# Patient Record
Sex: Female | Born: 1975 | Race: White | Hispanic: No | Smoking: Never smoker
Health system: Southern US, Community
[De-identification: ages and names within clinical notes are randomized; demographics above are authoritative.]

## PROBLEM LIST (undated history)

## (undated) DIAGNOSIS — R569 Unspecified convulsions: Secondary | ICD-10-CM

## (undated) DIAGNOSIS — G43909 Migraine, unspecified, not intractable, without status migrainosus: Secondary | ICD-10-CM

## (undated) DIAGNOSIS — O009 Unspecified ectopic pregnancy without intrauterine pregnancy: Secondary | ICD-10-CM

## (undated) HISTORY — PX: OTHER SURGICAL HISTORY: SHX169

## (undated) HISTORY — PX: RHINOPLASTY: SUR1284

## (undated) HISTORY — PX: KIDNEY STONE SURGERY: SHX686

## (undated) HISTORY — PX: HERNIA REPAIR: SHX51

---

## 1998-01-10 ENCOUNTER — Other Ambulatory Visit: Admission: RE | Admit: 1998-01-10 | Discharge: 1998-01-10 | Payer: Self-pay | Admitting: Obstetrics

## 1998-01-25 ENCOUNTER — Ambulatory Visit (HOSPITAL_COMMUNITY): Admission: RE | Admit: 1998-01-25 | Discharge: 1998-01-25 | Payer: Self-pay | Admitting: Obstetrics

## 1998-03-31 ENCOUNTER — Inpatient Hospital Stay (HOSPITAL_COMMUNITY): Admission: AD | Admit: 1998-03-31 | Discharge: 1998-04-02 | Payer: Self-pay | Admitting: Obstetrics

## 1998-04-09 ENCOUNTER — Inpatient Hospital Stay (HOSPITAL_COMMUNITY): Admission: AD | Admit: 1998-04-09 | Discharge: 1998-04-09 | Payer: Self-pay | Admitting: Obstetrics

## 1998-04-09 ENCOUNTER — Ambulatory Visit (HOSPITAL_COMMUNITY): Admission: RE | Admit: 1998-04-09 | Discharge: 1998-04-09 | Payer: Self-pay | Admitting: Obstetrics

## 1998-05-07 ENCOUNTER — Inpatient Hospital Stay (HOSPITAL_COMMUNITY): Admission: AD | Admit: 1998-05-07 | Discharge: 1998-05-07 | Payer: Self-pay | Admitting: Obstetrics

## 1998-05-31 ENCOUNTER — Inpatient Hospital Stay (HOSPITAL_COMMUNITY): Admission: AD | Admit: 1998-05-31 | Discharge: 1998-05-31 | Payer: Self-pay | Admitting: Obstetrics

## 1998-06-25 ENCOUNTER — Inpatient Hospital Stay (HOSPITAL_COMMUNITY): Admission: AD | Admit: 1998-06-25 | Discharge: 1998-06-25 | Payer: Self-pay | Admitting: Obstetrics

## 1998-06-29 ENCOUNTER — Inpatient Hospital Stay (HOSPITAL_COMMUNITY): Admission: AD | Admit: 1998-06-29 | Discharge: 1998-07-01 | Payer: Self-pay | Admitting: Obstetrics

## 1999-09-25 ENCOUNTER — Other Ambulatory Visit: Admission: RE | Admit: 1999-09-25 | Discharge: 1999-09-25 | Payer: Self-pay | Admitting: Obstetrics

## 1999-12-13 ENCOUNTER — Inpatient Hospital Stay (HOSPITAL_COMMUNITY): Admission: AD | Admit: 1999-12-13 | Discharge: 1999-12-13 | Payer: Self-pay | Admitting: Obstetrics and Gynecology

## 2000-01-23 ENCOUNTER — Encounter: Payer: Self-pay | Admitting: *Deleted

## 2000-01-23 ENCOUNTER — Ambulatory Visit (HOSPITAL_COMMUNITY): Admission: RE | Admit: 2000-01-23 | Discharge: 2000-01-23 | Payer: Self-pay | Admitting: *Deleted

## 2000-02-10 ENCOUNTER — Inpatient Hospital Stay (HOSPITAL_COMMUNITY): Admission: AD | Admit: 2000-02-10 | Discharge: 2000-02-10 | Payer: Self-pay | Admitting: Obstetrics & Gynecology

## 2000-02-24 ENCOUNTER — Inpatient Hospital Stay (HOSPITAL_COMMUNITY): Admission: AD | Admit: 2000-02-24 | Discharge: 2000-02-24 | Payer: Self-pay | Admitting: Obstetrics & Gynecology

## 2001-02-23 ENCOUNTER — Encounter: Admission: RE | Admit: 2001-02-23 | Discharge: 2001-02-23 | Payer: Self-pay | Admitting: Obstetrics and Gynecology

## 2001-02-23 ENCOUNTER — Encounter: Payer: Self-pay | Admitting: Obstetrics and Gynecology

## 2001-02-26 ENCOUNTER — Encounter: Admission: RE | Admit: 2001-02-26 | Discharge: 2001-02-26 | Payer: Self-pay | Admitting: Obstetrics and Gynecology

## 2001-02-26 ENCOUNTER — Encounter: Payer: Self-pay | Admitting: Obstetrics and Gynecology

## 2001-07-19 ENCOUNTER — Emergency Department (HOSPITAL_COMMUNITY): Admission: EM | Admit: 2001-07-19 | Discharge: 2001-07-19 | Payer: Self-pay | Admitting: Emergency Medicine

## 2001-07-19 ENCOUNTER — Encounter: Payer: Self-pay | Admitting: Emergency Medicine

## 2001-08-31 ENCOUNTER — Inpatient Hospital Stay (HOSPITAL_COMMUNITY): Admission: AD | Admit: 2001-08-31 | Discharge: 2001-09-01 | Payer: Self-pay | Admitting: Obstetrics

## 2001-11-04 ENCOUNTER — Observation Stay (HOSPITAL_COMMUNITY): Admission: AD | Admit: 2001-11-04 | Discharge: 2001-11-05 | Payer: Self-pay | Admitting: Obstetrics

## 2001-11-21 ENCOUNTER — Inpatient Hospital Stay (HOSPITAL_COMMUNITY): Admission: AD | Admit: 2001-11-21 | Discharge: 2001-11-21 | Payer: Self-pay | Admitting: Obstetrics

## 2001-12-07 ENCOUNTER — Inpatient Hospital Stay (HOSPITAL_COMMUNITY): Admission: AD | Admit: 2001-12-07 | Discharge: 2001-12-07 | Payer: Self-pay | Admitting: Obstetrics

## 2001-12-20 ENCOUNTER — Inpatient Hospital Stay (HOSPITAL_COMMUNITY): Admission: AD | Admit: 2001-12-20 | Discharge: 2001-12-22 | Payer: Self-pay | Admitting: Obstetrics

## 2001-12-20 ENCOUNTER — Encounter (INDEPENDENT_AMBULATORY_CARE_PROVIDER_SITE_OTHER): Payer: Self-pay

## 2002-08-22 ENCOUNTER — Emergency Department (HOSPITAL_COMMUNITY): Admission: EM | Admit: 2002-08-22 | Discharge: 2002-08-22 | Payer: Self-pay | Admitting: Emergency Medicine

## 2002-12-30 ENCOUNTER — Encounter: Payer: Self-pay | Admitting: Obstetrics

## 2002-12-30 ENCOUNTER — Encounter: Admission: RE | Admit: 2002-12-30 | Discharge: 2002-12-30 | Payer: Self-pay | Admitting: Obstetrics

## 2003-04-24 ENCOUNTER — Ambulatory Visit (HOSPITAL_BASED_OUTPATIENT_CLINIC_OR_DEPARTMENT_OTHER): Admission: RE | Admit: 2003-04-24 | Discharge: 2003-04-24 | Payer: Self-pay | Admitting: General Surgery

## 2003-04-24 ENCOUNTER — Encounter (INDEPENDENT_AMBULATORY_CARE_PROVIDER_SITE_OTHER): Payer: Self-pay | Admitting: *Deleted

## 2003-05-18 ENCOUNTER — Encounter: Payer: Self-pay | Admitting: General Surgery

## 2003-05-18 ENCOUNTER — Encounter: Admission: RE | Admit: 2003-05-18 | Discharge: 2003-05-18 | Payer: Self-pay | Admitting: General Surgery

## 2003-08-26 ENCOUNTER — Inpatient Hospital Stay (HOSPITAL_COMMUNITY): Admission: AD | Admit: 2003-08-26 | Discharge: 2003-08-28 | Payer: Self-pay | Admitting: Obstetrics

## 2003-08-26 ENCOUNTER — Encounter (INDEPENDENT_AMBULATORY_CARE_PROVIDER_SITE_OTHER): Payer: Self-pay | Admitting: Specialist

## 2003-08-27 ENCOUNTER — Encounter: Payer: Self-pay | Admitting: Obstetrics

## 2003-10-02 ENCOUNTER — Ambulatory Visit (HOSPITAL_COMMUNITY): Admission: RE | Admit: 2003-10-02 | Discharge: 2003-10-02 | Payer: Self-pay | Admitting: General Surgery

## 2003-10-02 ENCOUNTER — Encounter (INDEPENDENT_AMBULATORY_CARE_PROVIDER_SITE_OTHER): Payer: Self-pay | Admitting: *Deleted

## 2003-12-25 ENCOUNTER — Emergency Department (HOSPITAL_COMMUNITY): Admission: EM | Admit: 2003-12-25 | Discharge: 2003-12-26 | Payer: Self-pay | Admitting: Emergency Medicine

## 2003-12-26 ENCOUNTER — Ambulatory Visit (HOSPITAL_COMMUNITY): Admission: RE | Admit: 2003-12-26 | Discharge: 2003-12-26 | Payer: Self-pay | Admitting: Obstetrics

## 2004-01-01 ENCOUNTER — Inpatient Hospital Stay (HOSPITAL_COMMUNITY): Admission: AD | Admit: 2004-01-01 | Discharge: 2004-01-01 | Payer: Self-pay | Admitting: Obstetrics

## 2004-01-30 ENCOUNTER — Encounter (HOSPITAL_COMMUNITY): Admission: RE | Admit: 2004-01-30 | Discharge: 2004-02-29 | Payer: Self-pay | Admitting: Obstetrics

## 2004-01-30 ENCOUNTER — Ambulatory Visit (HOSPITAL_COMMUNITY): Admission: RE | Admit: 2004-01-30 | Discharge: 2004-01-30 | Payer: Self-pay | Admitting: Obstetrics

## 2004-02-13 ENCOUNTER — Inpatient Hospital Stay (HOSPITAL_COMMUNITY): Admission: AD | Admit: 2004-02-13 | Discharge: 2004-02-13 | Payer: Self-pay | Admitting: Obstetrics

## 2004-02-15 ENCOUNTER — Inpatient Hospital Stay (HOSPITAL_COMMUNITY): Admission: AD | Admit: 2004-02-15 | Discharge: 2004-02-20 | Payer: Self-pay | Admitting: Obstetrics

## 2004-03-05 ENCOUNTER — Encounter (HOSPITAL_COMMUNITY): Admission: RE | Admit: 2004-03-05 | Discharge: 2004-04-04 | Payer: Self-pay | Admitting: Obstetrics

## 2004-04-09 ENCOUNTER — Inpatient Hospital Stay (HOSPITAL_COMMUNITY): Admission: AD | Admit: 2004-04-09 | Discharge: 2004-04-09 | Payer: Self-pay | Admitting: Obstetrics

## 2004-04-30 ENCOUNTER — Inpatient Hospital Stay (HOSPITAL_COMMUNITY): Admission: AD | Admit: 2004-04-30 | Discharge: 2004-04-30 | Payer: Self-pay | Admitting: Obstetrics

## 2004-05-06 ENCOUNTER — Ambulatory Visit (HOSPITAL_COMMUNITY): Admission: RE | Admit: 2004-05-06 | Discharge: 2004-05-06 | Payer: Self-pay | Admitting: Obstetrics

## 2004-05-23 ENCOUNTER — Observation Stay (HOSPITAL_COMMUNITY): Admission: AD | Admit: 2004-05-23 | Discharge: 2004-05-24 | Payer: Self-pay | Admitting: Obstetrics

## 2004-06-08 ENCOUNTER — Inpatient Hospital Stay (HOSPITAL_COMMUNITY): Admission: AD | Admit: 2004-06-08 | Discharge: 2004-06-10 | Payer: Self-pay | Admitting: Obstetrics

## 2005-09-12 ENCOUNTER — Ambulatory Visit (HOSPITAL_COMMUNITY): Admission: RE | Admit: 2005-09-12 | Discharge: 2005-09-12 | Payer: Self-pay | Admitting: Obstetrics

## 2005-12-07 ENCOUNTER — Inpatient Hospital Stay (HOSPITAL_COMMUNITY): Admission: AD | Admit: 2005-12-07 | Discharge: 2005-12-07 | Payer: Self-pay | Admitting: Obstetrics

## 2005-12-14 ENCOUNTER — Encounter (HOSPITAL_COMMUNITY): Admission: RE | Admit: 2005-12-14 | Discharge: 2006-01-13 | Payer: Self-pay | Admitting: Obstetrics

## 2006-01-11 ENCOUNTER — Inpatient Hospital Stay (HOSPITAL_COMMUNITY): Admission: AD | Admit: 2006-01-11 | Discharge: 2006-01-13 | Payer: Self-pay | Admitting: Obstetrics

## 2006-01-18 ENCOUNTER — Encounter (HOSPITAL_COMMUNITY): Admission: RE | Admit: 2006-01-18 | Discharge: 2006-02-17 | Payer: Self-pay | Admitting: Obstetrics

## 2006-02-15 ENCOUNTER — Inpatient Hospital Stay (HOSPITAL_COMMUNITY): Admission: AD | Admit: 2006-02-15 | Discharge: 2006-02-16 | Payer: Self-pay | Admitting: Obstetrics

## 2006-02-16 ENCOUNTER — Inpatient Hospital Stay (HOSPITAL_COMMUNITY): Admission: AD | Admit: 2006-02-16 | Discharge: 2006-02-16 | Payer: Self-pay | Admitting: Obstetrics

## 2006-04-10 ENCOUNTER — Inpatient Hospital Stay (HOSPITAL_COMMUNITY): Admission: AD | Admit: 2006-04-10 | Discharge: 2006-04-12 | Payer: Self-pay | Admitting: Obstetrics

## 2007-08-10 ENCOUNTER — Inpatient Hospital Stay (HOSPITAL_COMMUNITY): Admission: AD | Admit: 2007-08-10 | Discharge: 2007-08-11 | Payer: Self-pay | Admitting: Obstetrics and Gynecology

## 2007-11-18 ENCOUNTER — Inpatient Hospital Stay (HOSPITAL_COMMUNITY): Admission: AD | Admit: 2007-11-18 | Discharge: 2007-11-18 | Payer: Self-pay | Admitting: Obstetrics

## 2007-12-20 ENCOUNTER — Inpatient Hospital Stay (HOSPITAL_COMMUNITY): Admission: AD | Admit: 2007-12-20 | Discharge: 2007-12-21 | Payer: Self-pay | Admitting: Obstetrics

## 2007-12-22 ENCOUNTER — Inpatient Hospital Stay (HOSPITAL_COMMUNITY): Admission: AD | Admit: 2007-12-22 | Discharge: 2007-12-22 | Payer: Self-pay | Admitting: Obstetrics

## 2008-02-24 ENCOUNTER — Inpatient Hospital Stay (HOSPITAL_COMMUNITY): Admission: AD | Admit: 2008-02-24 | Discharge: 2008-02-26 | Payer: Self-pay | Admitting: Obstetrics

## 2010-02-27 ENCOUNTER — Inpatient Hospital Stay (HOSPITAL_COMMUNITY): Admission: AD | Admit: 2010-02-27 | Discharge: 2010-02-27 | Payer: Self-pay | Admitting: Obstetrics

## 2010-02-27 ENCOUNTER — Ambulatory Visit: Payer: Self-pay | Admitting: Obstetrics and Gynecology

## 2010-05-15 ENCOUNTER — Inpatient Hospital Stay (HOSPITAL_COMMUNITY): Admission: AD | Admit: 2010-05-15 | Discharge: 2010-05-17 | Payer: Self-pay | Admitting: Obstetrics

## 2010-11-07 LAB — CBC
HCT: 34.8 % — ABNORMAL LOW (ref 36.0–46.0)
MCH: 32.1 pg (ref 26.0–34.0)
MCHC: 34.7 g/dL (ref 30.0–36.0)
MCHC: 35.2 g/dL (ref 30.0–36.0)
MCV: 93.5 fL (ref 78.0–100.0)
Platelets: 144 10*3/uL — ABNORMAL LOW (ref 150–400)
RDW: 13.2 % (ref 11.5–15.5)
RDW: 13.2 % (ref 11.5–15.5)
WBC: 8.6 10*3/uL (ref 4.0–10.5)

## 2010-11-07 LAB — RPR: RPR Ser Ql: NONREACTIVE

## 2010-11-10 LAB — URINE MICROSCOPIC-ADD ON

## 2010-11-10 LAB — URINALYSIS, ROUTINE W REFLEX MICROSCOPIC
Bilirubin Urine: NEGATIVE
Nitrite: NEGATIVE
Protein, ur: NEGATIVE mg/dL
Specific Gravity, Urine: 1.02 (ref 1.005–1.030)
Urobilinogen, UA: 0.2 mg/dL (ref 0.0–1.0)

## 2011-01-10 NOTE — Discharge Summary (Signed)
NAMESHERISSE, [name]                        ACCOUNT NO.:  [id number]   MEDICAL RECORD NO.:  [id number]                   PATIENT TYPE:  INP   LOCATION:  9140                                 FACILITY:  [facility]   PHYSICIAN:  Kathreen Cosier, M.D.           DATE OF BIRTH:  21-Dec-1975   DATE OF ADMISSION:  08/26/2003  DATE OF DISCHARGE:  08/27/2003                                 DISCHARGE SUMMARY   The patient is a 35 year old gravida 5, para 4, 0, 0, 4 whose last menstrual  period was July 11, 2003.  Susan Mcpherson awoke on the day of admission with  cramping in the left side and then started to have a large amount of  bleeding.  When Susan Mcpherson was seen her quantitative HCG was 209.  Ultrasound  showed no intrauterine pregnancy but noting the adnexa.  On examination her  uterus was of normal size and Susan Mcpherson had a very tender left adnexa.  Susan Mcpherson was  taken to the operating room with the diagnosis of an ectopic pregnancy.  At  the time of surgery both tubes were normal, however Susan Mcpherson had a ruptured left  corpus luteum cyst and some bleeding from the left ovary.  D&E was also  performed.  The patient did well and was discharged home on postoperative  day one on Tylox for pain and Vibra-Tabs 100 mg p.o. b.i.d.                                               Kathreen Cosier, M.D.    BAM/MEDQ  D:  08/27/2003  T:  08/28/2003  Job:  [id number]

## 2011-01-10 NOTE — Op Note (Signed)
Susan Mcpherson, JOSEPHSON                        ACCOUNT NO.:  [id number]   MEDICAL RECORD NO.:  [id number]                   PATIENT TYPE:  INP   LOCATION:  9140                                 FACILITY:  [facility]   PHYSICIAN:  Kathreen Cosier, M.D.           DATE OF BIRTH:  October 25, 1975   DATE OF PROCEDURE:  DATE OF DISCHARGE:                                 OPERATIVE REPORT   PREOPERATIVE DIAGNOSIS:  Left ectopic pregnancy.   POSTOPERATIVE DIAGNOSIS:  Spontaneous abortion with left products of  conception.   SURGEON:  Kathreen Cosier, M.D.   ANESTHESIA:  General.   DESCRIPTION OF PROCEDURE:  The patient placed on the operating table in  supine position, abdomen prepped and draped.  Bladder emptied with a Foley  catheter.  After general anesthesia was introduced, the suprapubic and then  _____________ approximately 2 inches long was made and carried down to the  fascia and the _____________ and incised.  The recti muscles retracted  laterally.  The uterus was normal.  Both tubes were normal.  The right ovary  was normal.  There was erupted left corpus lutein cyst with a significant  amount of fluid in the peritoneal cavity.  The procedure was terminated.  Lap and sponge counts correct.  The abdomen was closed and the peritoneum  closed with subcuticular stitches ___________.   The patient was placed in lithotomy position, a weighted speculum placed in  the vagina, the cervix injected with 7 mL of Xylocaine in the 3, 9, and 12  o'clock position.  The cervical os was __________.  The uterine cavity  sounded 9 cm, the cervix dilated to #27 Shawnie Pons, #7 suction used to aspirate a  moderate of uterine contents.  Hemostasis was satisfactory, the patient  taken to the recovery room in good condition.                                               Kathreen Cosier, M.D.    BAM/MEDQ  D:  08/26/2003  T:  08/27/2003  Job:  [id number]

## 2011-01-10 NOTE — Discharge Summary (Signed)
Susan Mcpherson, Susan Mcpherson                        ACCOUNT NO.:  [id number]   MEDICAL RECORD NO.:  [id number]                   PATIENT TYPE:  INP   LOCATION:  9158                                 FACILITY:  [facility]   PHYSICIAN:  Kathreen Cosier, M.D.           DATE OF BIRTH:  03-11-1976   DATE OF ADMISSION:  02/15/2004  DATE OF DISCHARGE:  02/20/2004                                 DISCHARGE SUMMARY   HOSPITAL COURSE:  The patient is a 35 year old gravida 6 para 4-0-1-4 with  [facility] June 29, 2004.  Was at home and having contractions.  She received  terbutaline 2 days prior to admission and came back in contracting.  She is  also on weekly Delalutin shots 250 mg.  She was admitted and started on  magnesium sulfate 4 g loading and 2 g/hour.  On admission her white count  was 7.3, hemoglobin 11, platelets 224, sodium 131, potassium 3.8, chloride  100.  Ultrasound showed that the cervix was 2.3 cm.  Prior ultrasound showed  it to be 2.2.  On June 27 she had stopped contractions.  The magnesium  sulfate was discharged and she was started on terbutaline 2.5 mg p.o. q.4h.  On June 28 at the time of discharge she had been without contractions for 48  hours.  She was discharged on total bedrest at home, terbutaline 2.5 mg  q.4h., ampicillin 500 p.o. q.6h. for 10 days, and told that if she had any  cramping then she was to return to the hospital.   DISCHARGE DIAGNOSIS:  Status post premature contractions.                                               Kathreen Cosier, M.D.    BAM/MEDQ  D:  03/06/2004  T:  03/06/2004  Job:  [id number]

## 2011-01-10 NOTE — Op Note (Signed)
   NAMEJACKYE, Susan Mcpherson                        ACCOUNT NO.:  [id number]   MEDICAL RECORD NO.:  [id number]                   PATIENT TYPE:  AMB   LOCATION:  DSC                                  FACILITY:  [facility]   PHYSICIAN:  Ollen Gross. Vernell Morgans, M.D.              DATE OF BIRTH:  Nov 24, 1975   DATE OF PROCEDURE:  04/24/2003  DATE OF DISCHARGE:                                 OPERATIVE REPORT   PREOPERATIVE DIAGNOSIS:  Right breast mass.   POSTOPERATIVE DIAGNOSIS:  Right breast mass.   PROCEDURE:  Excisional biopsy of right breast mass.   SURGEON:  Ollen Gross. Carolynne Edouard, M.D.   ANESTHESIA:  General via LMA.   DESCRIPTION OF PROCEDURE:  After informed consent was obtained, the patient  was brought to the operating room and placed in a supine position on the  operating room table.  After adequate induction of general anesthesia, the  patient's right breast area was prepped with Betadine and draped in the  usual sterile manner.  The mass in the right breast was in the 12 o'clock  position and palpable and mobile.  A curvilinear incision was made just at  the inferior edge of the mass.  The incision was carried down through the  skin and fatty tissue of the breast.  The mass could be palpated and as the  mass was being retracted between fingers, the mass was separated from the  rest of the breast tissue sharply using the Bovie electrocautery.  This was  done until the mass was completely excised.  Once the mass was excised, it  was sent to pathology for further evaluation.  There were several places  where milk ducts were leaking into the cavity at this point, and these were  all ligated with 3-0 Vicryl stitches.  Once this was accomplished, the wound  was examined and was completely hemostatic.  The wound was irrigated with  saline and the skin was closed with a running 4-0 Monocryl subcuticular  stitch.  Benzoin and Steri-Strips and sterile dressings were applied.  The  patient tolerated the  procedure well.  At the end of the case all needle,  sponge, and instrument counts were correct.  The patient was awakened and  taken to the recovery room in stable condition.                                               Ollen Gross. Vernell Morgans, M.D.    PST/MEDQ  D:  04/25/2003  T:  04/25/2003  Job:  [id number]

## 2011-01-10 NOTE — Op Note (Signed)
NAMEGWENDLYON, ZUMBRO                        ACCOUNT NO.:  [id number]   MEDICAL RECORD NO.:  [id number]                   PATIENT TYPE:  OIB   LOCATION:  2899                                 FACILITY:  [facility]   PHYSICIAN:  Leonie Man, M.D.                DATE OF BIRTH:  02-Aug-1976   DATE OF PROCEDURE:  10/02/2003  DATE OF DISCHARGE:                                 OPERATIVE REPORT   PREOPERATIVE DIAGNOSIS:  Right breast mass.   POSTOPERATIVE DIAGNOSIS:  Right breast mass.   OPERATION PERFORMED:  Excisional biopsy of right breast mass.   SURGEON:  Leonie Man, M.D.   ASSISTANT:  Nurse.   ANESTHESIA:  General.   INDICATIONS FOR PROCEDURE:  Ms. 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  She has since developed a new mass which is mobile, smooth  just superior and subjacent to the region of the previous excisional biopsy.  This was clinically a fibroadenoma.  She comes to the operating room now for  excision of this mass which has increased in size somewhat since first  discovered.   DESCRIPTION OF PROCEDURE:  Following the induction of satisfactory general  anesthesia, the patient was positioned supinely and the right breast was  prepped and draped to be included in the sterile operative field.  The prior  incision was excised in an elliptical fashion removing the entire scar  cicatrix.  Dissection was carried superiorly and medially towards the new  mass.  The mass was noted to be a rather bright yellow, somewhat gelatinous  mass.  This was dissected free with some surrounding capsule removed and  forwarded for pathologic evaluation.  The wound was then inspected for  hemostasis and noted to be dry.  Sponge and instrument counts were verified.  Subcutaneous tissues were closed with interrupted 3-0 Vicryl sutures.  Skin  closed with running 5-0 Monocryl suture and then  reinforced with Steri-  Strips.  Sterile dressings applied.  The anesthetic reversed.  Patient  removed from the operating room to the recovery room in stable condition.  The patient tolerated the procedure well.                                               Leonie Man, M.D.    PB/MEDQ  D:  10/02/2003  T:  10/02/2003  Job:  [id number]

## 2011-05-22 LAB — CBC
HCT: 32.9 — ABNORMAL LOW
Hemoglobin: 11.5 — ABNORMAL LOW
MCHC: 34.7
MCHC: 34.9
MCV: 93.3
MCV: 93.4
Platelets: 156
RBC: 3.53 — ABNORMAL LOW
RDW: 13.5

## 2011-05-30 LAB — URINALYSIS, ROUTINE W REFLEX MICROSCOPIC
Glucose, UA: NEGATIVE
Ketones, ur: NEGATIVE
Nitrite: NEGATIVE
Protein, ur: NEGATIVE

## 2011-05-30 LAB — CBC
HCT: 34.1 — ABNORMAL LOW
Platelets: 278
RDW: 13.1

## 2011-05-30 LAB — WET PREP, GENITAL: Clue Cells Wet Prep HPF POC: NONE SEEN

## 2011-05-30 LAB — GC/CHLAMYDIA PROBE AMP, GENITAL: GC Probe Amp, Genital: NEGATIVE

## 2013-06-22 ENCOUNTER — Encounter: Payer: Self-pay | Admitting: Emergency Medicine

## 2013-06-22 ENCOUNTER — Emergency Department (INDEPENDENT_AMBULATORY_CARE_PROVIDER_SITE_OTHER)
Admission: EM | Admit: 2013-06-22 | Discharge: 2013-06-22 | Disposition: A | Discharge status: Home / Self Care | Payer: Medicaid Other | Source: Home / Self Care | Attending: Emergency Medicine | Admitting: Emergency Medicine

## 2013-06-22 DIAGNOSIS — N3000 Acute cystitis without hematuria: Secondary | ICD-10-CM

## 2013-06-22 DIAGNOSIS — R300 Dysuria: Secondary | ICD-10-CM

## 2013-06-22 HISTORY — DX: Migraine, unspecified, not intractable, without status migrainosus: G43.909

## 2013-06-22 LAB — POCT URINALYSIS DIP (MANUAL ENTRY)
Bilirubin, UA: NEGATIVE
Glucose, UA: NEGATIVE
Ketones, POC UA: NEGATIVE
Nitrite, UA: NEGATIVE
Protein Ur, POC: NEGATIVE
Spec Grav, UA: 1.015 (ref 1.005–1.03)
Urobilinogen, UA: 0.2 (ref 0–1)
pH, UA: 7 (ref 5–8)

## 2013-06-22 MED ORDER — PHENAZOPYRIDINE HCL 200 MG PO TABS: ORAL_TABLET | ORAL | Status: DC

## 2013-06-22 MED ORDER — CIPROFLOXACIN HCL 500 MG PO TABS: 500.0000 mg | ORAL_TABLET | Freq: Two times a day (BID) | ORAL | Status: DC

## 2013-06-22 NOTE — ED Provider Notes (Signed)
CSN: [id number]     Arrival date & time 06/22/13  1458 History   First MD Initiated Contact with Patient 06/22/13 1516     Chief Complaint  Patient presents with  . Dysuria   (Consider location/radiation/quality/duration/timing/severity/associated sxs/prior Treatment) HPI This is a 37 y.o. female who presents today with UTI symptoms for 2 days.  + dysuria + frequency + urgency Today, + hematuria No vaginal discharge No fever/chills No lower abdominal pain No nausea No vomiting No back pain No fatigue She denies chance of pregnancy.--LMP 06/15/2013 Has tried over-the-counter measures without improvement.  Last UTI was December 2013, resolved with antibiotic   Past Medical History  Diagnosis Date  . Migraine    Past Surgical History  Procedure Laterality Date  . Gyn surgery    . Lumps in rt breast    . Rhinoplasty     History reviewed. No pertinent family history. History  Substance Use Topics  . Smoking status: Never Smoker   . Smokeless tobacco: Not on file  . Alcohol Use: No   OB History   Grav Para Term Preterm Abortions TAB SAB Ect Mult Living                 Review of Systems  All other systems reviewed and are negative.   See above Allergies  Codeine  Home Medications   Current Outpatient Rx  Name  Route  Sig  Dispense  Refill  . butalbital-acetaminophen-caffeine (FIORICET WITH CODEINE) 50-325-40-30 MG per capsule   Oral   Take 1 capsule by mouth every 4 (four) hours as needed for headache.         . topiramate (TOPAMAX) 100 MG tablet   Oral   Take 100 mg by mouth 2 (two) times daily.         . ciprofloxacin (CIPRO) 500 MG tablet   Oral   Take 1 tablet (500 mg total) by mouth 2 (two) times daily. For 7 days   14 tablet   0   . phenazopyridine (PYRIDIUM) 200 MG tablet      Take 1 tablet by mouth every 6-8 hours if needed for urinary pain   8 tablet   0    BP 146/87  Pulse 67  Temp(Src) 98 F (36.7 C) (Oral)  Ht 5\' 2"  (1.575  m)  Wt 141 lb (63.957 kg)  BMI 25.78 kg/m2  SpO2 100%  LMP 06/15/2013 Physical Exam  Nursing note and vitals reviewed. Constitutional: She is oriented to person, place, and time. She appears well-developed and well-nourished. No distress.  HENT:  Mouth/Throat: Oropharynx is clear and moist.  Eyes: No scleral icterus.  Neck: Neck supple.  Cardiovascular: Normal rate, regular rhythm and normal heart sounds.   Pulmonary/Chest: Breath sounds normal.  Abdominal: Soft. She exhibits no mass. There is no hepatosplenomegaly. There is tenderness in the suprapubic area. There is no rebound, no guarding and no CVA tenderness.  Lymphadenopathy:    She has no cervical adenopathy.  Neurological: She is alert and oriented to person, place, and time.  Skin: Skin is warm and dry.    ED Course  Procedures (including critical care time) Labs Review Labs Reviewed  URINE CULTURE  POCT URINALYSIS DIP (MANUAL ENTRY)   Imaging Review No results found.  EKG Interpretation     Ventricular Rate:    PR Interval:    QRS Duration:   QT Interval:    QTC Calculation:   R Axis:     Text Interpretation:  Urinalysis shows trace blood and small leukocytes MDM   1. Acute cystitis   2. Dysuria    Risks, benefits, alternatives discussed. She agrees with the following plans: Cipro 500 twice a day x7 days Pyridium when necessary urinary pain Push fluids and other symptomatic care. Urine culture sent. Followup here or with PCP if no better 7 days, sooner when necessary Precautions discussed. Red flags discussed. Questions invited and answered. Patient voiced understanding and agreement.    Lajean Manes, MD 06/22/13 [phone]

## 2013-06-22 NOTE — ED Notes (Signed)
Dysuria, polyuria, hematuria x 2 days worse today

## 2013-06-22 NOTE — ED Notes (Deleted)
Pt c/o LT thumb injury x 4 days. She reports she was playing a game of baseball and bent her LT thumb backwards. She has taken IBF prn pain.  

## 2013-06-24 LAB — URINE CULTURE
Colony Count: NO GROWTH
Organism ID, Bacteria: NO GROWTH

## 2013-07-13 DIAGNOSIS — D6859 Other primary thrombophilia: Secondary | ICD-10-CM | POA: Insufficient documentation

## 2013-07-13 DIAGNOSIS — G43409 Hemiplegic migraine, not intractable, without status migrainosus: Secondary | ICD-10-CM | POA: Insufficient documentation

## 2013-11-18 ENCOUNTER — Encounter: Payer: Self-pay | Admitting: Emergency Medicine

## 2013-11-18 ENCOUNTER — Emergency Department
Admission: EM | Admit: 2013-11-18 | Discharge: 2013-11-18 | Disposition: A | Discharge status: Home / Self Care | Payer: Medicaid Other | Source: Home / Self Care | Attending: Family Medicine | Admitting: Family Medicine

## 2013-11-18 DIAGNOSIS — N3000 Acute cystitis without hematuria: Secondary | ICD-10-CM

## 2013-11-18 HISTORY — DX: Unspecified ectopic pregnancy without intrauterine pregnancy: O00.90

## 2013-11-18 LAB — POCT URINALYSIS DIP (MANUAL ENTRY)
BILIRUBIN UA: NEGATIVE
BILIRUBIN UA: NEGATIVE
Glucose, UA: NEGATIVE
LEUKOCYTES UA: NEGATIVE
Nitrite, UA: NEGATIVE
Protein Ur, POC: NEGATIVE
SPEC GRAV UA: 1.02 (ref 1.005–1.03)
Urobilinogen, UA: 0.2 (ref 0–1)
pH, UA: 6 (ref 5–8)

## 2013-11-18 LAB — POCT URINE PREGNANCY: PREG TEST UR: NEGATIVE

## 2013-11-18 MED ORDER — NITROFURANTOIN MONOHYD MACRO 100 MG PO CAPS: 100.0000 mg | ORAL_CAPSULE | Freq: Two times a day (BID) | ORAL | Status: DC

## 2013-11-18 NOTE — ED Notes (Signed)
Reports onset of cramping sensation 2 weeks ago; frequency began 1 week ago.( Has migraine today and has taken her Topamax)There is a possibility she is pregnant and desires test.

## 2013-11-18 NOTE — ED Provider Notes (Signed)
CSN: [id number]     Arrival date & time 11/18/13  4098 History   First MD Initiated Contact with Patient 11/18/13 1014     Chief Complaint  Patient presents with  . Urinary Frequency  . Morning Sickness  . Migraine      HPI Comments: Patient complains of onset of lower abdominal cramping about two weeks ago, followed by urinary frequency, urgency, nocturia, and malodorous urine about one week ago.  She has had sweats but no fever.  Patient's last menstrual period was 10/23/2013.  She desires to have a pregnancy test.  Patient is a 38 y.o. female presenting with dysuria. The history is provided by the patient.  Dysuria Pain quality:  Burning Pain severity:  Mild Onset quality:  Gradual Duration:  1 week Timing:  Intermittent Progression:  Unchanged Chronicity:  New Recent urinary tract infections: no   Relieved by:  Nothing Worsened by:  Nothing tried Ineffective treatments:  None tried Urinary symptoms: foul-smelling urine, frequent urination and hesitancy   Urinary symptoms: no discolored urine, no hematuria and no bladder incontinence   Associated symptoms: nausea   Associated symptoms: no abdominal pain, no fever, no flank pain, no genital lesions, no vaginal discharge and no vomiting   Risk factors: sexually active     Past Medical History  Diagnosis Date  . Migraine   . Ectopic pregnancy    Past Surgical History  Procedure Laterality Date  . Gyn surgery    . Lumps in rt breast    . Rhinoplasty     Family History  Problem Relation Age of Onset  . Hypertension Father    History  Substance Use Topics  . Smoking status: Never Smoker   . Smokeless tobacco: Not on file  . Alcohol Use: No   OB History   Grav Para Term Preterm Abortions TAB SAB Ect Mult Living                 Review of Systems  Constitutional: Negative for fever.  Gastrointestinal: Positive for nausea. Negative for vomiting and abdominal pain.  Genitourinary: Positive for dysuria. Negative for  flank pain and vaginal discharge.  All other systems reviewed and are negative.    Allergies  Codeine  Home Medications   Current Outpatient Rx  Name  Route  Sig  Dispense  Refill  . butalbital-acetaminophen-caffeine (FIORICET WITH CODEINE) 50-325-40-30 MG per capsule   Oral   Take 1 capsule by mouth every 4 (four) hours as needed for headache.         . nitrofurantoin, macrocrystal-monohydrate, (MACROBID) 100 MG capsule   Oral   Take 1 capsule (100 mg total) by mouth 2 (two) times daily.   14 capsule   0   . topiramate (TOPAMAX) 100 MG tablet   Oral   Take 100 mg by mouth 2 (two) times daily.          BP 145/80  Pulse 70  Temp(Src) 98.3 F (36.8 C) (Oral)  Resp 16  Ht 5\' 1"  (1.549 m)  Wt 138 lb (62.596 kg)  BMI 26.09 kg/m2  SpO2 99%  LMP 10/23/2013 Physical Exam Nursing notes and Vital Signs reviewed. Appearance:  Patient appears healthy, stated age, and in no acute distress Eyes:  Pupils are equal, round, and reactive to light and accomodation.  Extraocular movement is intact.  Conjunctivae are not inflamed.  Pharynx:  Normal, moist mucous membranes  Neck:  Supple.  No adenopathy Lungs:  Clear to auscultation.  Breath  sounds are equal.  Heart:  Regular rate and rhythm without murmurs, rubs, or gallops.  Abdomen: Vague tenderness over bladder without masses or hepatosplenomegaly.  Bowel sounds are present.  No CVA or flank tenderness.  Extremities:  No edema.  No calf tenderness Skin:  No rash present.   ED Course  Procedures  none    Labs Reviewed  URINE CULTURE  POCT URINALYSIS DIP (MANUAL ENTRY) BLO trace-intact, otherwise negative  POCT URINE PREGNANCY negative        MDM   1. Acute cystitis    Urine culture pending.  Begin Macrobid. Increase fluid intake. Followup with Family Doctor if not improved in one week.     Lattie HawStephen A Quashaun Lazalde, MD 11/20/13 2158

## 2013-11-18 NOTE — Discharge Instructions (Signed)
Increase fluid intake. ° ° °Urinary Tract Infection °Urinary tract infections (UTIs) can develop anywhere along your urinary tract. Your urinary tract is your body's drainage system for removing wastes and extra water. Your urinary tract includes two kidneys, two ureters, a bladder, and a urethra. Your kidneys are a pair of bean-shaped organs. Each kidney is about the size of your fist. They are located below your ribs, one on each side of your spine. °CAUSES °Infections are caused by microbes, which are microscopic organisms, including fungi, viruses, and bacteria. These organisms are so small that they can only be seen through a microscope. Bacteria are the microbes that most commonly cause UTIs. °SYMPTOMS  °Symptoms of UTIs may vary by age and gender of the patient and by the location of the infection. Symptoms in young women typically include a frequent and intense urge to urinate and a painful, burning feeling in the bladder or urethra during urination. Older women and men are more likely to be tired, shaky, and weak and have muscle aches and abdominal pain. A fever may mean the infection is in your kidneys. Other symptoms of a kidney infection include pain in your back or sides below the ribs, nausea, and vomiting. °DIAGNOSIS °To diagnose a UTI, your caregiver will ask you about your symptoms. Your caregiver also will ask to provide a urine sample. The urine sample will be tested for bacteria and white blood cells. White blood cells are made by your body to help fight infection. °TREATMENT  °Typically, UTIs can be treated with medication. Because most UTIs are caused by a bacterial infection, they usually can be treated with the use of antibiotics. The choice of antibiotic and length of treatment depend on your symptoms and the type of bacteria causing your infection. °HOME CARE INSTRUCTIONS °· If you were prescribed antibiotics, take them exactly as your caregiver instructs you. Finish the medication even if  you feel better after you have only taken some of the medication. °· Drink enough water and fluids to keep your urine clear or pale yellow. °· Avoid caffeine, tea, and carbonated beverages. They tend to irritate your bladder. °· Empty your bladder often. Avoid holding urine for long periods of time. °· Empty your bladder before and after sexual intercourse. °· After a bowel movement, women should cleanse from front to back. Use each tissue only once. °SEEK MEDICAL CARE IF:  °· You have back pain. °· You develop a fever. °· Your symptoms do not begin to resolve within 3 days. °SEEK IMMEDIATE MEDICAL CARE IF:  °· You have severe back pain or lower abdominal pain. °· You develop chills. °· You have nausea or vomiting. °· You have continued burning or discomfort with urination. °MAKE SURE YOU:  °· Understand these instructions. °· Will watch your condition. °· Will get help right away if you are not doing well or get worse. °Document Released: 05/21/2005 Document Revised: 02/10/2012 Document Reviewed: 09/19/2011 °[employer]® Patient Information ©2014 [employer]. ° °

## 2013-11-19 LAB — URINE CULTURE

## 2013-11-23 ENCOUNTER — Telehealth: Payer: Self-pay | Admitting: *Deleted

## 2013-12-29 ENCOUNTER — Emergency Department (INDEPENDENT_AMBULATORY_CARE_PROVIDER_SITE_OTHER): Payer: Medicaid Other

## 2013-12-29 ENCOUNTER — Encounter: Payer: Self-pay | Admitting: Emergency Medicine

## 2013-12-29 ENCOUNTER — Emergency Department
Admission: EM | Admit: 2013-12-29 | Discharge: 2013-12-29 | Disposition: A | Discharge status: Home / Self Care | Payer: Medicaid Other | Source: Home / Self Care | Attending: Family Medicine | Admitting: Family Medicine

## 2013-12-29 DIAGNOSIS — M79609 Pain in unspecified limb: Secondary | ICD-10-CM

## 2013-12-29 DIAGNOSIS — S93609A Unspecified sprain of unspecified foot, initial encounter: Secondary | ICD-10-CM

## 2013-12-29 NOTE — ED Provider Notes (Signed)
CSN: [id number]     Arrival date & time 12/29/13  1223 History   First MD Initiated Contact with Patient 12/29/13 1224     Chief Complaint  Patient presents with  . Foot Pain    HPI  Left foot pain x1 day. Patient was walking her son to the bus stop she accidentally wrote her left foot and ankle. Has had a marked amount of pain on the dorsum of the left foot as well as just below left lateral ankle. No distal numbness or paresthesias. Minimal swelling. He is able to ambulate albeit with a significant amount of pain.  Past Medical History  Diagnosis Date  . Migraine   . Ectopic pregnancy    Past Surgical History  Procedure Laterality Date  . Gyn surgery    . Lumps in rt breast    . Rhinoplasty     Family History  Problem Relation Age of Onset  . Hypertension Father    History  Substance Use Topics  . Smoking status: Never Smoker   . Smokeless tobacco: Never Used  . Alcohol Use: No   OB History   Grav Para Term Preterm Abortions TAB SAB Ect Mult Living                 Review of Systems  All other systems reviewed and are negative.   Allergies  Cinnamon; Codeine; and Latex  Home Medications   Prior to Admission medications   Medication Sig Start Date End Date Taking? Authorizing Provider  butalbital-acetaminophen-caffeine (FIORICET WITH CODEINE) 50-325-40-30 MG per capsule Take 1 capsule by mouth every 4 (four) hours as needed for headache.    Historical Provider, MD  topiramate (TOPAMAX) 100 MG tablet Take 100 mg by mouth 2 (two) times daily.    Historical Provider, MD   BP 134/83  Pulse 74  Resp 14  Wt 139 lb (63.05 kg)  SpO2 99%  LMP 12/20/2013 Physical Exam  Constitutional: She appears well-developed and well-nourished.  HENT:  Head: Normocephalic and atraumatic.  Eyes: Conjunctivae are normal. Pupils are equal, round, and reactive to light.  Neck: Normal range of motion.  Cardiovascular: Normal rate and regular rhythm.   Pulmonary/Chest: Effort  normal and breath sounds normal.  Abdominal: Soft.  Musculoskeletal:       Feet:  Positive tenderness to palpation over affected area. Limited range of motion secondary to pain. Minimal swelling. Neurovascularly intact.  Neurological: She is alert.  Skin: Skin is warm.    ED Course  Procedures (including critical care time) Labs Review Labs Reviewed - No data to display  Imaging Review Dg Foot Complete Left  12/29/2013   CLINICAL DATA:  Injury.  EXAM: LEFT FOOT - COMPLETE 3+ VIEW  COMPARISON:  None.  FINDINGS: There is no evidence of fracture or dislocation. There is no evidence of arthropathy or other focal bone abnormality. Soft tissues are unremarkable.  IMPRESSION: Negative.   Electronically Signed   By: Elberta Fortisaniel  Boyle M.D.   On: 12/29/2013 13:03     MDM   1. Foot sprain    Xrays negative for any fracture or dislocation.  Will place in ankle brace and post op shoe RICE and NSAIDs.  Discussed supportive care and MSK red flags  Follow up as needed.     The patient and/or caregiver has been counseled thoroughly with regard to treatment plan and/or medications prescribed including dosage, schedule, interactions, rationale for use, and possible side effects and they verbalize understanding. Diagnoses and expected  course of recovery discussed and will return if not improved as expected or if the condition worsens. Patient and/or caregiver verbalized understanding.         Doree AlbeeSteven Wes Lezotte, MD 01/03/14 1743

## 2013-12-29 NOTE — ED Notes (Signed)
Susan Mcpherson reports rolling her left foot this AM stepping up onto a sidewalk. No previous injuries.

## 2014-01-01 ENCOUNTER — Telehealth: Payer: Self-pay | Admitting: Emergency Medicine

## 2014-01-03 ENCOUNTER — Telehealth: Payer: Self-pay | Admitting: *Deleted

## 2014-06-02 ENCOUNTER — Emergency Department
Admission: EM | Admit: 2014-06-02 | Discharge: 2014-06-02 | Discharge status: Absent without leave | Payer: Medicaid Other | Source: Home / Self Care | Attending: Emergency Medicine | Admitting: Emergency Medicine

## 2014-06-02 ENCOUNTER — Emergency Department (HOSPITAL_BASED_OUTPATIENT_CLINIC_OR_DEPARTMENT_OTHER)
Admission: EM | Admit: 2014-06-02 | Discharge: 2014-06-02 | Disposition: A | Discharge status: Home / Self Care | Payer: Medicaid Other | Attending: Emergency Medicine | Admitting: Emergency Medicine

## 2014-06-02 ENCOUNTER — Encounter (HOSPITAL_BASED_OUTPATIENT_CLINIC_OR_DEPARTMENT_OTHER): Payer: Self-pay | Admitting: Emergency Medicine

## 2014-06-02 DIAGNOSIS — G43909 Migraine, unspecified, not intractable, without status migrainosus: Secondary | ICD-10-CM | POA: Insufficient documentation

## 2014-06-02 DIAGNOSIS — Z3202 Encounter for pregnancy test, result negative: Secondary | ICD-10-CM | POA: Diagnosis not present

## 2014-06-02 DIAGNOSIS — Z79899 Other long term (current) drug therapy: Secondary | ICD-10-CM | POA: Insufficient documentation

## 2014-06-02 DIAGNOSIS — Z91040 Latex allergy status: Secondary | ICD-10-CM | POA: Insufficient documentation

## 2014-06-02 DIAGNOSIS — R109 Unspecified abdominal pain: Secondary | ICD-10-CM | POA: Diagnosis not present

## 2014-06-02 DIAGNOSIS — N3000 Acute cystitis without hematuria: Secondary | ICD-10-CM | POA: Insufficient documentation

## 2014-06-02 DIAGNOSIS — R300 Dysuria: Secondary | ICD-10-CM | POA: Diagnosis present

## 2014-06-02 DIAGNOSIS — N939 Abnormal uterine and vaginal bleeding, unspecified: Secondary | ICD-10-CM | POA: Insufficient documentation

## 2014-06-02 DIAGNOSIS — G40909 Epilepsy, unspecified, not intractable, without status epilepticus: Secondary | ICD-10-CM | POA: Insufficient documentation

## 2014-06-02 HISTORY — DX: Unspecified convulsions: R56.9

## 2014-06-02 LAB — URINALYSIS, ROUTINE W REFLEX MICROSCOPIC
Bilirubin Urine: NEGATIVE
Glucose, UA: NEGATIVE mg/dL
KETONES UR: NEGATIVE mg/dL
NITRITE: NEGATIVE
PROTEIN: 100 mg/dL — AB
Specific Gravity, Urine: 1.025 (ref 1.005–1.030)
UROBILINOGEN UA: 0.2 mg/dL (ref 0.0–1.0)
pH: 5 (ref 5.0–8.0)

## 2014-06-02 LAB — URINE MICROSCOPIC-ADD ON

## 2014-06-02 LAB — PREGNANCY, URINE: Preg Test, Ur: NEGATIVE

## 2014-06-02 NOTE — ED Provider Notes (Signed)
CSN: [id number]     Arrival date & time 06/02/14  0941 History   First MD Initiated Contact with Patient 06/02/14 1107     Chief Complaint  Patient presents with  . Dysuria     (Consider location/radiation/quality/duration/timing/severity/associated sxs/prior Treatment) Patient is a 38 y.o. female presenting with dysuria. The history is provided by the patient.  Dysuria Pain quality:  Sharp and burning Pain severity:  Moderate Onset quality:  Sudden Duration:  2 days Timing:  Constant Progression:  Unchanged Chronicity:  New Recent urinary tract infections: no   Relieved by:  Nothing Worsened by:  Nothing tried Ineffective treatments:  None tried Associated symptoms: abdominal pain   Associated symptoms: no fever, no vaginal discharge and no vomiting     Past Medical History  Diagnosis Date  . Migraine   . Ectopic pregnancy   . Seizures    Past Surgical History  Procedure Laterality Date  . Gyn surgery    . Lumps in rt breast    . Rhinoplasty     Family History  Problem Relation Age of Onset  . Hypertension Father    History  Substance Use Topics  . Smoking status: Never Smoker   . Smokeless tobacco: Never Used  . Alcohol Use: No   OB History   Grav Para Term Preterm Abortions TAB SAB Ect Mult Living                 Review of Systems  Constitutional: Negative for fever and chills.  Respiratory: Negative for cough and shortness of breath.   Cardiovascular: Negative for chest pain and leg swelling.  Gastrointestinal: Positive for abdominal pain. Negative for vomiting.  Genitourinary: Positive for dysuria, urgency, frequency, vaginal bleeding (LMP now) and difficulty urinating. Negative for vaginal discharge.  All other systems reviewed and are negative.     Allergies  Cinnamon; Codeine; and Latex  Home Medications   Prior to Admission medications   Medication Sig Start Date End Date Taking? Authorizing Provider  butalbital-acetaminophen-caffeine  (FIORICET WITH CODEINE) 50-325-40-30 MG per capsule Take 1 capsule by mouth every 4 (four) hours as needed for headache.    Historical Provider, MD  topiramate (TOPAMAX) 100 MG tablet Take 100 mg by mouth 2 (two) times daily.    Historical Provider, MD   BP 153/86  Pulse 67  Temp(Src) 98.1 F (36.7 C) (Oral)  Resp 16  Ht 5\' 2"  (1.575 m)  Wt 137 lb (62.143 kg)  BMI 25.05 kg/m2  SpO2 98%  LMP 05/28/2014 Physical Exam  Nursing note and vitals reviewed. Constitutional: She is oriented to person, place, and time. She appears well-developed and well-nourished. No distress.  HENT:  Head: Normocephalic and atraumatic.  Mouth/Throat: Oropharynx is clear and moist.  Eyes: EOM are normal. Pupils are equal, round, and reactive to light.  Neck: Normal range of motion. Neck supple.  Cardiovascular: Normal rate and regular rhythm.  Exam reveals no friction rub.   No murmur heard. Pulmonary/Chest: Effort normal and breath sounds normal. No respiratory distress. She has no wheezes. She has no rales.  Abdominal: Soft. She exhibits no distension. There is tenderness (mild). There is no rebound.  Musculoskeletal: Normal range of motion. She exhibits no edema.  Neurological: She is alert and oriented to person, place, and time.  Skin: She is not diaphoretic.    ED Course  Procedures (including critical care time) Labs Review Labs Reviewed  URINALYSIS, ROUTINE W REFLEX MICROSCOPIC - Abnormal; Notable for the following:  Color, Urine RED (*)    APPearance CLOUDY (*)    Hgb urine dipstick LARGE (*)    Protein, ur 100 (*)    Leukocytes, UA MODERATE (*)    All other components within normal limits  URINE MICROSCOPIC-ADD ON - Abnormal; Notable for the following:    Squamous Epithelial / LPF FEW (*)    Bacteria, UA MANY (*)    All other components within normal limits  PREGNANCY, URINE    Imaging Review No results found.   EKG Interpretation None      MDM   Final diagnoses:  Acute  cystitis without hematuria    49F presents with dysuria, frequency, and urgency. No fevers, but some nausea and diarrhea.  UA shows UTI. Will give diflucan with her macrobid b/c she always gets yeast infections with her antibiotic use.    Elwin MochaBlair Aliesha Dolata, MD 06/02/14 1121

## 2014-06-02 NOTE — Discharge Instructions (Signed)
Urinary Tract Infection °Urinary tract infections (UTIs) can develop anywhere along your urinary tract. Your urinary tract is your body's drainage system for removing wastes and extra water. Your urinary tract includes two kidneys, two ureters, a bladder, and a urethra. Your kidneys are a pair of bean-shaped organs. Each kidney is about the size of your fist. They are located below your ribs, one on each side of your spine. °CAUSES °Infections are caused by microbes, which are microscopic organisms, including fungi, viruses, and bacteria. These organisms are so small that they can only be seen through a microscope. Bacteria are the microbes that most commonly cause UTIs. °SYMPTOMS  °Symptoms of UTIs may vary by age and gender of the patient and by the location of the infection. Symptoms in young women typically include a frequent and intense urge to urinate and a painful, burning feeling in the bladder or urethra during urination. Older women and men are more likely to be tired, shaky, and weak and have muscle aches and abdominal pain. A fever may mean the infection is in your kidneys. Other symptoms of a kidney infection include pain in your back or sides below the ribs, nausea, and vomiting. °DIAGNOSIS °To diagnose a UTI, your caregiver will ask you about your symptoms. Your caregiver also will ask to provide a urine sample. The urine sample will be tested for bacteria and white blood cells. White blood cells are made by your body to help fight infection. °TREATMENT  °Typically, UTIs can be treated with medication. Because most UTIs are caused by a bacterial infection, they usually can be treated with the use of antibiotics. The choice of antibiotic and length of treatment depend on your symptoms and the type of bacteria causing your infection. °HOME CARE INSTRUCTIONS °· If you were prescribed antibiotics, take them exactly as your caregiver instructs you. Finish the medication even if you feel better after you  have only taken some of the medication. °· Drink enough water and fluids to keep your urine clear or pale yellow. °· Avoid caffeine, tea, and carbonated beverages. They tend to irritate your bladder. °· Empty your bladder often. Avoid holding urine for long periods of time. °· Empty your bladder before and after sexual intercourse. °· After a bowel movement, women should cleanse from front to back. Use each tissue only once. °SEEK MEDICAL CARE IF:  °· You have back pain. °· You develop a fever. °· Your symptoms do not begin to resolve within 3 days. °SEEK IMMEDIATE MEDICAL CARE IF:  °· You have severe back pain or lower abdominal pain. °· You develop chills. °· You have nausea or vomiting. °· You have continued burning or discomfort with urination. °MAKE SURE YOU:  °· Understand these instructions. °· Will watch your condition. °· Will get help right away if you are not doing well or get worse. °Document Released: 05/21/2005 Document Revised: 02/10/2012 Document Reviewed: 09/19/2011 °ExitCare® Patient Information ©2015 [employer]. This information is not intended to replace advice given to you by your health care provider. Make sure you discuss any questions you have with your health care provider. ° ° °Emergency Department Resource Guide °1) Find a Doctor and Pay Out of Pocket °Although you won't have to find out who is covered by your insurance plan, it is a good idea to ask around and get recommendations. You will then need to call the office and see if the doctor you have chosen will accept you as a new patient and what types of   options they offer for patients who are self-pay. Some doctors offer discounts or will set up payment plans for their patients who do not have insurance, but you will need to ask so you aren't surprised when you get to your appointment. ° °2) Contact Your Local Health Department °Not all health departments have doctors that can see patients for sick visits, but many do, so it is worth  a call to see if yours does. If you don't know where your local health department is, you can check in your phone book. The CDC also has a tool to help you locate your state's health department, and many state websites also have listings of all of their local health departments. ° °3) Find a Walk-in Clinic °If your illness is not likely to be very severe or complicated, you may want to try a walk in clinic. These are popping up all over the country in pharmacies, drugstores, and shopping centers. They're usually staffed by nurse practitioners or physician assistants that have been trained to treat common illnesses and complaints. They're usually fairly quick and inexpensive. However, if you have serious medical issues or chronic medical problems, these are probably not your best option. ° °No Primary Care Doctor: °- Call [facility] at  832-8000 - they can help you locate a primary care doctor that  accepts your insurance, provides certain services, etc. °- Physician Referral Service- 1-800-533-3463 ° °Chronic Pain Problems: °Organization         Address  Phone   Notes  °[facility]  (336) 297-2271 Patients need to be referred by their primary care doctor.  ° °Medication Assistance: °Organization         Address  Phone   Notes  °[place] [facility] [address] °[place], [place] [address] (336) 641-8030 --Must be a resident of [place] °-- Must have NO insurance coverage whatsoever (no Medicaid/ Medicare, etc.) °-- The pt. MUST have a primary care doctor that directs their care regularly and follows them in the community °  °[facility]  (866) 331-1348   °[employer]  (888) 892-1162   ° °Agencies that provide inexpensive medical care: °Organization         Address  Phone   Notes  °[facility]  (336) 832-8035   °[facility]    (336) 832-7272   °[facility] [address] °[place], [place]  [address] (336) 832-4777   °[facility] [address], °[place] (336) 271-4999   °[facility]    (336) 373-0678   °[facility]    (336) 272-1050   °[facility] ° [address], [place] Phone:  (336) 832-4444, Fax:  (336) 832-4440 Hours of Operation:  9 am - 6 pm, M-F.  Also accepts Medicaid/Medicare and self-pay.  °[facility] ° [address], [place] Phone: (336) 832-3150, Fax: (336) 832-3151. Hours of Operation:  8:30 am - 5:30 pm, M-F.  Also accepts Medicaid and self-pay.  °[facility] [place] [address], [place] Phone: (336) 878-6027   °[facility] [address], [place],  (336)723-1848, Ext. 123 Mondays & Thursdays: 7-9 AM.  First 15 patients are seen on a first come, first serve basis. °  ° °Medicaid-accepting [place] Providers: ° °Organization         Address  Phone   Notes  °  [facility] [address], [place] (336) 641-2100 Also accepts self-pay patients.  °[facility] [address], [place] ° (336) 856-9996   °[facility] [address], [place] (336) 288-8857   °[facility] [address] [place] [address], [place] (336) 299-7000   °Veita Bland [address], [place]  ° (336) 373-1557 Only accepts [place] Access Medicaid patients after they have their name applied to their card.  ° °Self-Pay (no insurance) in [place]: ° °Organization         Address  Phone   Notes  °Sickle Cell Patients, [facility] [address], [place] (336) 832-1970   °[facility] [address], [place] (336) 832-4400   °[facility] ° [address], [place] (336) 992-4800   °[facility]/Dr. Osei-Bonsu ° [address] [place] [address], [place] or [address], [place] (336) 841-8500 Phone number for both [place] and [place] locations is the same.  °[facility] [address], [place] (336) 299-0000   °[facility] [address] [place] [address], [place] or [address] (336) 852-7530 °(336) 878-2260   °[facility] [address], [place] (336) 350-1642, phone; (336) 294-5005, fax Sees patients 1st and 3rd Saturday of every month.  Must not qualify for public or private insurance (i.e. Medicaid, Medicare, [place] Health Choice, Veterans' Benefits) • Household income should be no more than 200% of the poverty level •The clinic cannot treat you if you are pregnant or think you are pregnant • Sexually transmitted diseases are not treated at the clinic.  ° ° °Dental Care: °Organization         Address  Phone  Notes  °[facility] [address], [place] (336) 641-6152 Accepts children up to age 21 who are enrolled in Medicaid or [place] Health Choice; pregnant women with a Medicaid card; and children who have applied for Medicaid or [place] Health Choice, but were declined, whose parents can pay a reduced fee at time of service.  °[facility] [place]  [address], [place] (336) 641-7733 Accepts children up to age 21 who are enrolled in Medicaid or [place] Health Choice; pregnant women with a Medicaid card; and children who have applied for Medicaid or  Health Choice, but were declined, whose parents can pay a reduced fee at time of service.  °[facility] ° [address],  (336) 641-4533 Patients are seen by appointment only. Walk-ins are not accepted. [facility] will see patients 18 years of age and older. °Monday - Tuesday (8am-5pm) °Most Wednesdays (8:30-5pm) °$30 per visit, cash only  °[facility] ° [address], [place] (336) 641-4533  Patients are seen by appointment only. Walk-ins are not accepted. [facility] will see patients 18 years of age and older. °One Wednesday Evening (Monthly: Volunteer Based).  $30 per visit, cash only  °[facility]  (919) 537-3737 for adults; Children under age 4, call Graduate Pediatric Dentistry at ([phone]) [phone]. Children aged 4-14, please call (919) [phone] to request a pediatric application. ° Dental services are provided in all areas of dental care including fillings, crowns and bridges, complete and partial   dentures, implants, gum treatment, root canals, and extractions. Preventive care is also provided. Treatment is provided to both adults and children. °Patients are selected via a lottery and there is often a waiting list. °  °[facility] [address], °[place] ° (336) 763-8833 [email or web address] °  °[facility] [address], [place], [place] (336)723-1848, Ext. 123 Second and Fourth Thursday of each month, opens at 6:30 AM; Clinic ends at 9 AM.  Patients are seen on a first-come first-served basis, and a limited number are seen during each clinic.  ° °[facility] ° [address], [place], [place] (336) 723-7904   Eligibility Requirements °You must have lived in [place], [place], or [place] counties for at least the last three months. °  You cannot be eligible for state or federal sponsored healthcare insurance, including Veterans Administration, Medicaid, or Medicare. °  You generally cannot be eligible for healthcare insurance through your employer.  °  How to apply: °Eligibility screenings are held every Tuesday and Wednesday afternoon from 1:00 pm until 4:00 pm. You do not need an appointment for the interview!  °[facility] [address], [place], [place] 336-631-2330   °[facility]  336-342-8273   °[facility]  336-703-3100   °[facility]   336-570-6415   ° °Behavioral Health Resources in the Community: °Intensive Outpatient Programs °Organization         Address  Phone  Notes  °[facility] [address], [place], [place] 336-878-6098   °[facility] [address], [place], [place] 336-832-9800   °[facility] [address], [place], [place] ° 336-882-2125   °[facility] [address],  °[place], [place] 1-800-853-5163 or 336-641-4981   °Substance Abuse Resources °Organization         Address  Phone  Notes  °[facility]  336-882-2125   °[facility]  336-784-9470   °[facility]  336-285-9073   °[facility]  336-845-3988   °Residential & Outpatient Substance Abuse Program  1-800-659-3381   °Psychological Services °Organization         Address  Phone  Notes  °[facility]  336- 832-9600   °[facility]  336- 378-7881   °[facility] [address], [place] 1-800-853-5163 or 336-641-4981   ° °Mobile Crisis Teams °Organization         Address  Phone  Notes  °[facility], Mobile Crisis Care Unit  1-877-626-1772   °Assertive °[facility] ° [address] [place], [place] 336-834-9664   °Sharon DeEsch [address] °[place] 336-554-5454   ° °Self-Help/Support Groups °Organization         Address  Phone             Notes  °[facility] [place] - variety of support groups  336- 373-1402 Call for more information  °Narcotics Anonymous (NA), [facility] [address], °[place]  2 meetings at this location  ° °Residential Treatment Programs °Organization         Address  Phone  Notes  °[facility] [address],    ° [place]  1-866-801-8205   °[facility] ° [address], [place],  704-293-8524   °[facility] [address], [place] 336-845-3988 Admissions: 8am-3pm M-F   °  [facility] [address]    °[place], [place] 336-841-1104   °[facility] [address], [place], [place] 336-379-7146   °[facility] [address]  °[place], [place] 336-285-9073   °[facility] Intensive Outpatient [address], [place], [place] 336-852-3033   °[facility] [address]  °[place], [place] 1-877-615-2722 or 336-784-9470   °[facility] [address] [place], [place] 336-227-7417 Accepts Medicaid  °[facility] [address]  °[place] 1-800-659-3381 Substance Abuse/Addiction Treatment  ° °[place] Behavioral Health Resources °Organization         Address  Phone  Notes  °[facility]  (888) 581-9988   °Julie Brannon, PhD [address] [place], [place]   (336) 349-5553 or (336) 951-0000   °[facility]   [address] °[place], [place] (336) 349-4454   °[facility] [address], [place], [place] (336) 342-8316 Insurance/Medicaid/sponsorship through [facility]  °[facility] [address]                                    [place], [place] (336) 342-8316 Therapy/tele-psych/case  °[facility] [address]  ° [place], [place] (336) 349-2233    °Dr. Arfeen  (336) 349-4544   °[facility] [place]  [employer] [place] [facility] 1) [address], [place] °2) [address], [place] °3)  [address], [place] (336) 349-3220 °(336) 342-7768 ° °(336) 342-8140   °[place] [facility] (336) 342-1394 or (336) 342-3537 (After Hours)    ° ° ° ° °

## 2014-06-02 NOTE — ED Notes (Signed)
Pt c/o urinary freq and pressure that began 2 days ago.

## 2014-06-02 NOTE — ED Notes (Signed)
MD at bedside. 

## 2014-06-12 ENCOUNTER — Emergency Department (HOSPITAL_BASED_OUTPATIENT_CLINIC_OR_DEPARTMENT_OTHER)
Admission: EM | Admit: 2014-06-12 | Discharge: 2014-06-12 | Disposition: A | Discharge status: Home / Self Care | Payer: Medicaid Other | Attending: Emergency Medicine | Admitting: Emergency Medicine

## 2014-06-12 ENCOUNTER — Encounter (HOSPITAL_BASED_OUTPATIENT_CLINIC_OR_DEPARTMENT_OTHER): Payer: Self-pay | Admitting: Emergency Medicine

## 2014-06-12 ENCOUNTER — Emergency Department (HOSPITAL_BASED_OUTPATIENT_CLINIC_OR_DEPARTMENT_OTHER): Payer: Medicaid Other

## 2014-06-12 DIAGNOSIS — Y9389 Activity, other specified: Secondary | ICD-10-CM | POA: Insufficient documentation

## 2014-06-12 DIAGNOSIS — G43909 Migraine, unspecified, not intractable, without status migrainosus: Secondary | ICD-10-CM | POA: Insufficient documentation

## 2014-06-12 DIAGNOSIS — Y9289 Other specified places as the place of occurrence of the external cause: Secondary | ICD-10-CM | POA: Insufficient documentation

## 2014-06-12 DIAGNOSIS — W231XXA Caught, crushed, jammed, or pinched between stationary objects, initial encounter: Secondary | ICD-10-CM | POA: Insufficient documentation

## 2014-06-12 DIAGNOSIS — Z91040 Latex allergy status: Secondary | ICD-10-CM | POA: Diagnosis not present

## 2014-06-12 DIAGNOSIS — S20309A Unspecified superficial injuries of unspecified front wall of thorax, initial encounter: Secondary | ICD-10-CM | POA: Diagnosis present

## 2014-06-12 DIAGNOSIS — Z79899 Other long term (current) drug therapy: Secondary | ICD-10-CM | POA: Diagnosis not present

## 2014-06-12 DIAGNOSIS — G40909 Epilepsy, unspecified, not intractable, without status epilepticus: Secondary | ICD-10-CM | POA: Insufficient documentation

## 2014-06-12 DIAGNOSIS — R0789 Other chest pain: Secondary | ICD-10-CM

## 2014-06-12 LAB — URINALYSIS, ROUTINE W REFLEX MICROSCOPIC
Bilirubin Urine: NEGATIVE
GLUCOSE, UA: NEGATIVE mg/dL
Ketones, ur: NEGATIVE mg/dL
Nitrite: NEGATIVE
PROTEIN: NEGATIVE mg/dL
SPECIFIC GRAVITY, URINE: 1.024 (ref 1.005–1.030)
Urobilinogen, UA: 1 mg/dL (ref 0.0–1.0)
pH: 6.5 (ref 5.0–8.0)

## 2014-06-12 LAB — URINE MICROSCOPIC-ADD ON

## 2014-06-12 MED ORDER — KETOROLAC TROMETHAMINE 60 MG/2ML IM SOLN
30.0000 mg | Freq: Once | INTRAMUSCULAR | Status: AC
  Administered 2014-06-12: 30 mg via INTRAMUSCULAR
  Filled 2014-06-12: qty 2

## 2014-06-12 MED ORDER — OXYCODONE-ACETAMINOPHEN 5-325 MG PO TABS: 1.0000 | ORAL_TABLET | ORAL | Status: DC | PRN

## 2014-06-12 MED ORDER — DIAZEPAM 5 MG PO TABS: 5.0000 mg | ORAL_TABLET | Freq: Four times a day (QID) | ORAL | Status: DC | PRN

## 2014-06-12 NOTE — ED Notes (Signed)
Patient preparing for discharge. 

## 2014-06-12 NOTE — Discharge Instructions (Signed)
Rib Fracture °A rib fracture is a break or crack in one of the bones of the ribs. The ribs are a group of long, curved bones that wrap around your chest and attach to your spine. They protect your lungs and other organs in the chest cavity. A broken or cracked rib is often painful, but most do not cause other problems. Most rib fractures heal on their own over time. However, rib fractures can be more serious if multiple ribs are broken or if broken ribs move out of place and push against other structures. °CAUSES  °· A direct blow to the chest. For example, this could happen during contact sports, a car accident, or a fall against a hard object. °· Repetitive movements with high force, such as pitching a baseball or having severe coughing spells. °SYMPTOMS  °· Pain when you breathe in or cough. °· Pain when someone presses on the injured area. °DIAGNOSIS  °Your caregiver will perform a physical exam. Various imaging tests may be ordered to confirm the diagnosis and to look for related injuries. These tests may include a chest X-ray, computed tomography (CT), magnetic resonance imaging (MRI), or a bone scan. °TREATMENT  °Rib fractures usually heal on their own in 1-3 months. The longer healing period is often associated with a continued cough or other aggravating activities. During the healing period, pain control is very important. Medication is usually given to control pain. Hospitalization or surgery may be needed for more severe injuries, such as those in which multiple ribs are broken or the ribs have moved out of place.  °HOME CARE INSTRUCTIONS  °· Avoid strenuous activity and any activities or movements that cause pain. Be careful during activities and avoid bumping the injured rib. °· Gradually increase activity as directed by your caregiver. °· Only take over-the-counter or prescription medications as directed by your caregiver. Do not take other medications without asking your caregiver first. °· Apply ice  to the injured area for the first 1-2 days after you have been treated or as directed by your caregiver. Applying ice helps to reduce inflammation and pain. °¨ Put ice in a plastic bag. °¨ Place a towel between your skin and the bag.   °¨ Leave the ice on for 15-20 minutes at a time, every 2 hours while you are awake. °· Perform deep breathing as directed by your caregiver. This will help prevent pneumonia, which is a common complication of a broken rib. Your caregiver may instruct you to: °¨ Take deep breaths several times a day. °¨ Try to cough several times a day, holding a pillow against the injured area. °¨ Use a device called an incentive spirometer to practice deep breathing several times a day. °· Drink enough fluids to keep your urine clear or pale yellow. This will help you avoid constipation.   °· Do not wear a rib belt or binder. These restrict breathing, which can lead to pneumonia.   °SEEK IMMEDIATE MEDICAL CARE IF:  °· You have a fever.   °· You have difficulty breathing or shortness of breath.   °· You develop a continual cough, or you cough up thick or bloody sputum. °· You feel sick to your stomach (nausea), throw up (vomit), or have abdominal pain.   °· You have worsening pain not controlled with medications.   °MAKE SURE YOU: °· Understand these instructions. °· Will watch your condition. °· Will get help right away if you are not doing well or get worse. °Document Released: 08/11/2005 Document Revised: 04/13/2013 Document Reviewed:   10/13/2012 °ExitCare® Patient Information ©2015 [employer]. This information is not intended to replace advice given to you by your health care provider. Make sure you discuss any questions you have with your health care provider. ° ° °Emergency Department Resource Guide °1) Find a Doctor and Pay Out of Pocket °Although you won't have to find out who is covered by your insurance plan, it is a good idea to ask around and get recommendations. You will then need to  call the office and see if the doctor you have chosen will accept you as a new patient and what types of options they offer for patients who are self-pay. Some doctors offer discounts or will set up payment plans for their patients who do not have insurance, but you will need to ask so you aren't surprised when you get to your appointment. ° °2) Contact Your Local Health Department °Not all health departments have doctors that can see patients for sick visits, but many do, so it is worth a call to see if yours does. If you don't know where your local health department is, you can check in your phone book. The CDC also has a tool to help you locate your state's health department, and many state websites also have listings of all of their local health departments. ° °3) Find a Walk-in Clinic °If your illness is not likely to be very severe or complicated, you may want to try a walk in clinic. These are popping up all over the country in pharmacies, drugstores, and shopping centers. They're usually staffed by nurse practitioners or physician assistants that have been trained to treat common illnesses and complaints. They're usually fairly quick and inexpensive. However, if you have serious medical issues or chronic medical problems, these are probably not your best option. ° °No Primary Care Doctor: °- Call [facility] at  832-8000 - they can help you locate a primary care doctor that  accepts your insurance, provides certain services, etc. °- Physician Referral Service- 1-800-533-3463 ° °Chronic Pain Problems: °Organization         Address  Phone   Notes  °[facility]  (336) 297-2271 Patients need to be referred by their primary care doctor.  ° °Medication Assistance: °Organization         Address  Phone   Notes  °[place] [facility] [address] °[place], [place] [address] (336) 641-8030 --Must be a resident of [place] °-- Must have NO insurance  coverage whatsoever (no Medicaid/ Medicare, etc.) °-- The pt. MUST have a primary care doctor that directs their care regularly and follows them in the community °  °[facility]  (866) 331-1348   °[employer]  (888) 892-1162   ° °Agencies that provide inexpensive medical care: °Organization         Address  Phone   Notes  °[facility]  (336) 832-8035   °[facility]    (336) 832-7272   °[facility] [address] °[place], [place] [address] (336) 832-4777   °[facility] [address], °[place] (336) 271-4999   °[facility]    (336) 373-0678   °[facility]    (336) 272-1050   °[facility] ° [address], [place] Phone:  (336) 832-4444, Fax:  (336) 832-4440 Hours of Operation:  9 am - 6 pm, M-F.  Also accepts Medicaid/Medicare and self-pay.  °[facility] ° [address], [place] Phone: (336) 832-3150, Fax: (336) 832-3151. Hours of Operation:  8:30 am - 5:30 pm, M-F.  Also accepts Medicaid and self-pay.  °[facility] [place] [address], [place] Phone: (336) 878-6027   °[facility] [address], [place], [place] (336)723-1848, Ext. 123 Mondays & Thursdays: 7-9 AM.  First 15 patients are seen on a first come, first serve basis. °  ° °Medicaid-accepting [place] Providers: ° °Organization         Address  Phone   Notes  °[facility] [address], [place] (336) 641-2100 Also accepts self-pay patients.  °[facility] [address], [place] ° (336) 856-9996   °[facility] [address], [place] (336) 288-8857   °[facility] [address] [place] [address], [place] (336) 299-7000   °Veita Bland [address], [place]  ° (336) 373-1557 Only accepts [place] Access Medicaid patients after they have their  name applied to their card.  ° °Self-Pay (no insurance) in [place]: ° °Organization         Address  Phone   Notes  °Sickle Cell Patients, [facility] [address], [place] (336) 832-1970   °[facility] [address], [place] (336) 832-4400   °[facility] [place] ° [address],  (336) 992-4800   °[facility]/Dr. Osei-Bonsu ° [address] [place] [address], [place] or [address], [place] (336) 841-8500 Phone number for both [place] and [place] locations is the same.  °[facility] [address], [place] (336) 299-0000   °[facility] [address] [place] [address], [place] or [address] (336) 852-7530 °(336) 878-2260   °[facility] [address], [place] (336) 350-1642, phone; (336) 294-5005, fax Sees patients 1st and 3rd Saturday of every month.  Must not qualify for public or private insurance (i.e. Medicaid, Medicare, [place] Health Choice, Veterans' Benefits) • Household income should be no more than 200% of the poverty level •The clinic cannot treat you if you are pregnant or think you are pregnant • Sexually transmitted diseases are not treated at the clinic.  ° ° °Dental Care: °Organization         Address  Phone  Notes  °[facility] [address], [place] (336) 641-6152 Accepts children up to age 21 who are enrolled in Medicaid or [place] Health Choice; pregnant women with a Medicaid card; and children who have applied for Medicaid or [place] Health Choice, but were declined, whose parents can pay a reduced fee at time of service.  °[facility] [place]  [address], [place] (336) 641-7733 Accepts children up to age 21 who are enrolled in Medicaid or [place] Health Choice; pregnant women with a Medicaid card; and children who have applied for  Medicaid or [place] Health Choice, but were declined, whose parents can pay a reduced fee at time of service.  °[facility] ° [address], [place] (336) 641-4533 Patients are seen by appointment only. Walk-ins are not accepted. [facility] will see patients 18 years of age and older. °Monday - Tuesday (8am-5pm) °Most Wednesdays (8:30-5pm) °$30 per visit,   cash only  °[facility] ° [address], [place] (336) 641-4533 Patients are seen by appointment only. Walk-ins are not accepted. [facility] will see patients 18 years of age and older. °One Wednesday Evening (Monthly: Volunteer Based).  $30 per visit, cash only  °[facility]  (919) 537-3737 for adults; Children under age 4, call Graduate Pediatric Dentistry at (919) [phone]. Children aged 4-14, please call (919) [phone] to request a pediatric application. ° Dental services are provided in all areas of dental care including fillings, crowns and bridges, complete and partial dentures, implants, gum treatment, root canals, and extractions. Preventive care is also provided. Treatment is provided to both adults and children. °Patients are selected via a lottery and there is often a waiting list. °  °[facility] [address], °[place] ° (336) 763-8833 [email or web address] °  °[facility] [address], [place], [place] (336)723-1848, Ext. 123 Second and Fourth Thursday of each month, opens at 6:30 AM; Clinic ends at 9 AM.  Patients are seen on a first-come first-served basis, and a limited number are seen during each clinic.  ° °[facility] ° [address], [place], [place] (336) 723-7904   Eligibility Requirements °You must have lived in [place], [place], or [place] counties for at least the last three months. °  You cannot be eligible for state or federal sponsored healthcare insurance, including Veterans Administration, Medicaid,  or Medicare. °  You generally cannot be eligible for healthcare insurance through your employer.  °  How to apply: °Eligibility screenings are held every Tuesday and Wednesday afternoon from 1:00 pm until 4:00 pm. You do not need an appointment for the interview!  °[facility] [address], [place], [place] 336-631-2330   °[facility]  336-342-8273   °[facility]  336-703-3100   °[facility]  336-570-6415   ° °Behavioral Health Resources in the Community: °Intensive Outpatient Programs °Organization         Address  Phone  Notes  °[facility] [address], [place], [place] 336-878-6098   °[facility] [address], [place], [place] 336-832-9800   °[facility] [address], [place], [place] ° 336-882-2125   °[facility] [address],  °[place], [place] 1-800-853-5163 or 336-641-4981   °Substance Abuse Resources °Organization         Address  Phone  Notes  °[facility]  336-882-2125   °[facility]  336-784-9470   °[facility] [place] [facility]  336-285-9073   °[facility]  336-845-3988   °Residential & Outpatient Substance Abuse Program  1-800-659-3381   °Psychological Services °Organization         Address  Phone  Notes  °[facility]  336- 832-9600   °[facility]  336- 378-7881   °[facility] [address], [place] 1-800-853-5163 or 336-641-4981   ° °Mobile Crisis Teams °Organization         Address  Phone  Notes  °[facility], Mobile Crisis Care Unit  1-877-626-1772   °Assertive °[facility] ° [address] [place], [place] 336-834-9664   °Sharon DeEsch [address] °  336-554-5454   ° °Self-Help/Support Groups °Organization         Address  Phone               Notes  °[facility] [place] - variety of support groups   336- 373-1402 Call for more information  °Narcotics Anonymous (NA), [facility] [address], °[place]  2 meetings at this location  ° °Residential Treatment Programs °Organization         Address  Phone  Notes  °[facility] [address],    °[place]  1-866-801-8205   °[facility] ° [address], [place], [place] 704-293-8524   °[facility] [address], [place] 336-845-3988 Admissions: 8am-3pm M-F  °[facility] [address]    °[place], [place] 336-841-1104   °[facility] [address], [place], [place] 336-379-7146   °[facility] [place] [facility] [address]  °[place], [place] 336-285-9073   °[facility] Intensive Outpatient [address], [place], [place] 336-852-3033   °[facility] [address]  °[place], [place] 1-877-615-2722 or 336-784-9470   °[facility] [address] [place], [place] 336-227-7417 Accepts Medicaid  °[facility] [address]  °[place] 1-800-659-3381 Substance Abuse/Addiction Treatment  ° °[place] Behavioral Health Resources °Organization         Address  Phone  Notes  °[facility]  (888) 581-9988   °Julie Brannon, PhD [address] [place], [place]   (336) 349-5553 or (336) 951-0000   °[facility]   [address] °[place], [place] (336) 349-4454   °[facility] [address], [place], [place] (336) 342-8316 Insurance/Medicaid/sponsorship through [facility]  °[facility] [address]                                    [place], [place] (336) 342-8316 Therapy/tele-psych/case  °[facility] [address]  ° [place], [place] (336) 349-2233    °Dr. Arfeen  (336) 349-4544   °[facility] [place]  [employer] [place] [facility] 1) [address], [place] °2) [address], [place] °3)  [address], [place] (336) 349-3220 °(336) 342-7768 ° °(336) 342-8140   °[place] [facility] (336) 342-1394 or (336) 342-3537 (After Hours)    ° ° ° °

## 2014-06-12 NOTE — ED Notes (Signed)
Right rib injury-pain started after doing leg presses with 225 lb weight

## 2014-06-12 NOTE — ED Provider Notes (Signed)
CSN: [id number]     Arrival date & time 06/12/14  1412 History  This chart was scribed for Susan MoMatthew Gyan Cambre, MD by Gwenyth Oberatherine Macek, ED Scribe. This patient was seen in room MH06/MH06 and the patient's care was started at 4:29 PM.   Chief Complaint  Patient presents with  . Rib Injury   The history is provided by the patient. No language interpreter was used.   HPI Comments: Pete GlatterLeah B Hernandez is a 38 y.o. female who presents to the Emergency Department complaining of constant, gradually worsening right rib pain after an injury that occurred 2 days ago. Pt states was doing leg presses with 255 lb weights on a Smith machine when her left foot slipped, caught the weight, and her right knee hit the right side of her chest. Pt states that pain does not change with position. Pt denies cough, fever, back pain, and hip pain as associated symptoms.   Pt also complains of painful urination that started 10 hours ago. She states she was at the ED on 10/10 and was diagnosed with a UTI. She finished Diflucan/macrobid treatment.  Past Medical History  Diagnosis Date  . Migraine   . Ectopic pregnancy   . Seizures    Past Surgical History  Procedure Laterality Date  . Gyn surgery    . Lumps in rt breast    . Rhinoplasty     Family History  Problem Relation Age of Onset  . Hypertension Father    History  Substance Use Topics  . Smoking status: Never Smoker   . Smokeless tobacco: Never Used  . Alcohol Use: No   OB History   Grav Para Term Preterm Abortions TAB SAB Ect Mult Living                 Review of Systems  Constitutional: Negative for fever.  Respiratory: Negative for cough.   Gastrointestinal: Negative for abdominal pain.  Genitourinary: Positive for difficulty urinating.  Musculoskeletal: Positive for arthralgias. Negative for back pain.  All other systems reviewed and are negative.  Allergies  Cinnamon; Codeine; and Latex  Home Medications   Prior to Admission medications    Medication Sig Start Date End Date Taking? Authorizing Provider  butalbital-acetaminophen-caffeine (FIORICET WITH CODEINE) 50-325-40-30 MG per capsule Take 1 capsule by mouth every 4 (four) hours as needed for headache.    Historical Provider, MD  topiramate (TOPAMAX) 100 MG tablet Take 100 mg by mouth 2 (two) times daily.    Historical Provider, MD   BP 154/86  Pulse 68  Temp(Src) 98.3 F (36.8 C) (Oral)  Resp 18  Ht 5\' 2"  (1.575 m)  Wt 137 lb (62.143 kg)  BMI 25.05 kg/m2  SpO2 100%  LMP 05/28/2014 Physical Exam  Nursing note and vitals reviewed. Constitutional: She is oriented to person, place, and time. She appears well-developed and well-nourished. No distress.  HENT:  Head: Normocephalic and atraumatic.  Right Ear: External ear normal.  Left Ear: External ear normal.  Eyes: Conjunctivae and EOM are normal. Pupils are equal, round, and reactive to light.  Neck: Normal range of motion. Neck supple. No tracheal deviation present.  Cardiovascular: Normal rate, regular rhythm, normal heart sounds and intact distal pulses.  Exam reveals no gallop and no friction rub.   No murmur heard. Pulmonary/Chest: Effort normal and breath sounds normal. No respiratory distress. She exhibits tenderness. She exhibits no crepitus.  Lungs sounds are equal bilaterally  Abdominal: Soft. Bowel sounds are normal. There is no tenderness.  Musculoskeletal: Normal range of motion.  Neurological: She is alert and oriented to person, place, and time.  Skin: Skin is warm and dry.  Psychiatric: She has a normal mood and affect. Her behavior is normal.    ED Course  Procedures (including critical care time) DIAGNOSTIC STUDIES: Oxygen Saturation is 100% on RA, normal by my interpretation.    COORDINATION OF CARE: 4:39 PM Discussed x-ray and will order pain medication. Advised pt to try ice and modified exercise for continued treatment and to return to ED with SOB. Discussed treatment plan with pt at  bedside and pt agreed to plan.  Labs Review Labs Reviewed - No data to display  Imaging Review Dg Ribs Unilateral W/chest Right  06/12/2014   CLINICAL DATA:  Right rib injury from weight lifting. Initial encounter.  EXAM: RIGHT RIBS AND CHEST - 3+ VIEW  COMPARISON:  PA and lateral chest 09/28/2003.  FINDINGS: Heart size and mediastinal contours are within normal limits. Both lungs are clear. Visualized skeletal structures are unremarkable. No fracture is identified.  IMPRESSION: Negative exam.   Electronically Signed   By: Drusilla Kannerhomas  Dalessio M.D.   On: 06/12/2014 15:40     EKG Interpretation None      MDM   Final diagnoses:  None    38 y.o. female without pertinent PMH presents with right chest wall pain after injury as described above. Physical exam consistent for significant right-sided chest wall tenderness without crepitus. X-rays obtained and as above unremarkable. Patient also reports some continued dysuria.  UA obtained and with only small leukocytes. Will not treat with antibiotics given recent course and improved UA.  Discussed with pt that she very likely could have an occult rib fracture and discussed strict return precautions for PTX.  Pt voiced understanding and agreed to fu.    1. Chest wall pain            Susan MoMatthew Desera Graffeo, MD 06/13/14 [phone]

## 2014-06-30 ENCOUNTER — Encounter (HOSPITAL_BASED_OUTPATIENT_CLINIC_OR_DEPARTMENT_OTHER): Payer: Self-pay | Admitting: *Deleted

## 2014-06-30 ENCOUNTER — Emergency Department (HOSPITAL_BASED_OUTPATIENT_CLINIC_OR_DEPARTMENT_OTHER)
Admission: EM | Admit: 2014-06-30 | Discharge: 2014-07-01 | Disposition: A | Discharge status: Home / Self Care | Payer: Medicaid Other | Attending: Emergency Medicine | Admitting: Emergency Medicine

## 2014-06-30 ENCOUNTER — Emergency Department (HOSPITAL_BASED_OUTPATIENT_CLINIC_OR_DEPARTMENT_OTHER): Payer: Medicaid Other

## 2014-06-30 DIAGNOSIS — N12 Tubulo-interstitial nephritis, not specified as acute or chronic: Secondary | ICD-10-CM | POA: Diagnosis not present

## 2014-06-30 DIAGNOSIS — G43909 Migraine, unspecified, not intractable, without status migrainosus: Secondary | ICD-10-CM | POA: Insufficient documentation

## 2014-06-30 DIAGNOSIS — R112 Nausea with vomiting, unspecified: Secondary | ICD-10-CM | POA: Diagnosis not present

## 2014-06-30 DIAGNOSIS — R109 Unspecified abdominal pain: Secondary | ICD-10-CM

## 2014-06-30 DIAGNOSIS — Z79899 Other long term (current) drug therapy: Secondary | ICD-10-CM | POA: Insufficient documentation

## 2014-06-30 DIAGNOSIS — Z3202 Encounter for pregnancy test, result negative: Secondary | ICD-10-CM | POA: Insufficient documentation

## 2014-06-30 DIAGNOSIS — G40909 Epilepsy, unspecified, not intractable, without status epilepticus: Secondary | ICD-10-CM | POA: Insufficient documentation

## 2014-06-30 DIAGNOSIS — R1031 Right lower quadrant pain: Secondary | ICD-10-CM | POA: Diagnosis present

## 2014-06-30 DIAGNOSIS — Z91040 Latex allergy status: Secondary | ICD-10-CM | POA: Diagnosis not present

## 2014-06-30 LAB — CBC WITH DIFFERENTIAL/PLATELET
BASOS PCT: 0 % (ref 0–1)
Basophils Absolute: 0 10*3/uL (ref 0.0–0.1)
EOS ABS: 0.1 10*3/uL (ref 0.0–0.7)
EOS PCT: 1 % (ref 0–5)
HCT: 37.8 % (ref 36.0–46.0)
Hemoglobin: 12.9 g/dL (ref 12.0–15.0)
LYMPHS ABS: 2.1 10*3/uL (ref 0.7–4.0)
Lymphocytes Relative: 30 % (ref 12–46)
MCH: 31.2 pg (ref 26.0–34.0)
MCHC: 34.1 g/dL (ref 30.0–36.0)
MCV: 91.5 fL (ref 78.0–100.0)
MONOS PCT: 9 % (ref 3–12)
Monocytes Absolute: 0.6 10*3/uL (ref 0.1–1.0)
Neutro Abs: 4.1 10*3/uL (ref 1.7–7.7)
Neutrophils Relative %: 60 % (ref 43–77)
PLATELETS: 234 10*3/uL (ref 150–400)
RBC: 4.13 MIL/uL (ref 3.87–5.11)
RDW: 12.2 % (ref 11.5–15.5)
WBC: 6.9 10*3/uL (ref 4.0–10.5)

## 2014-06-30 LAB — URINE MICROSCOPIC-ADD ON

## 2014-06-30 LAB — URINALYSIS, ROUTINE W REFLEX MICROSCOPIC
Bilirubin Urine: NEGATIVE
Glucose, UA: NEGATIVE mg/dL
Ketones, ur: NEGATIVE mg/dL
Nitrite: POSITIVE — AB
PH: 6.5 (ref 5.0–8.0)
PROTEIN: 100 mg/dL — AB
SPECIFIC GRAVITY, URINE: 1.015 (ref 1.005–1.030)
UROBILINOGEN UA: 0.2 mg/dL (ref 0.0–1.0)

## 2014-06-30 LAB — COMPREHENSIVE METABOLIC PANEL
ALT: 24 U/L (ref 0–35)
AST: 17 U/L (ref 0–37)
Albumin: 4 g/dL (ref 3.5–5.2)
Alkaline Phosphatase: 84 U/L (ref 39–117)
Anion gap: 14 (ref 5–15)
BILIRUBIN TOTAL: 0.3 mg/dL (ref 0.3–1.2)
BUN: 20 mg/dL (ref 6–23)
CALCIUM: 9.4 mg/dL (ref 8.4–10.5)
CO2: 27 meq/L (ref 19–32)
Chloride: 102 mEq/L (ref 96–112)
Creatinine, Ser: 0.9 mg/dL (ref 0.50–1.10)
GFR calc Af Amer: >90 mL/min (ref >90)
GFR calc non Af Amer: 81 mL/min — ABNORMAL LOW (ref >90)
Glucose, Bld: 90 mg/dL (ref 70–99)
POTASSIUM: 4 meq/L (ref 3.7–5.3)
Sodium: 143 mEq/L (ref 137–147)
Total Protein: 7.8 g/dL (ref 6.0–8.3)

## 2014-06-30 LAB — PREGNANCY, URINE: Preg Test, Ur: NEGATIVE

## 2014-06-30 MED ORDER — OXYCODONE-ACETAMINOPHEN 5-325 MG PO TABS: 1.0000 | ORAL_TABLET | Freq: Four times a day (QID) | ORAL | Status: DC | PRN

## 2014-06-30 MED ORDER — LEVOFLOXACIN 750 MG PO TABS: 750.0000 mg | ORAL_TABLET | Freq: Every day | ORAL | Status: AC

## 2014-06-30 MED ORDER — KETOROLAC TROMETHAMINE 30 MG/ML IJ SOLN
30.0000 mg | Freq: Once | INTRAMUSCULAR | Status: AC
  Administered 2014-06-30: 30 mg via INTRAVENOUS
  Filled 2014-06-30: qty 1

## 2014-06-30 MED ORDER — SODIUM CHLORIDE 0.9 % IV BOLUS (SEPSIS)
1000.0000 mL | Freq: Once | INTRAVENOUS | Status: AC
  Administered 2014-06-30: 1000 mL via INTRAVENOUS

## 2014-06-30 MED ORDER — DEXTROSE 5 % IV SOLN: 1.0000 g | Freq: Once | INTRAVENOUS | Status: AC

## 2014-06-30 MED ORDER — ONDANSETRON HCL 4 MG/2ML IJ SOLN
4.0000 mg | Freq: Once | INTRAMUSCULAR | Status: AC
  Administered 2014-06-30: 4 mg via INTRAVENOUS
  Filled 2014-06-30: qty 2

## 2014-06-30 MED ORDER — HYDROMORPHONE HCL 1 MG/ML IJ SOLN
1.0000 mg | Freq: Once | INTRAMUSCULAR | Status: AC
  Administered 2014-06-30: 1 mg via INTRAVENOUS
  Filled 2014-06-30: qty 1

## 2014-06-30 MED ORDER — CEFTRIAXONE SODIUM 1 G IJ SOLR
INTRAMUSCULAR | Status: AC
  Administered 2014-06-30: 1000 mg
  Filled 2014-06-30: qty 10

## 2014-06-30 NOTE — ED Notes (Signed)
Fever and abdominal pain. Pain to her right lower quadrant since yesterday.

## 2014-06-30 NOTE — ED Notes (Signed)
Patient transported to CT 

## 2014-06-30 NOTE — ED Provider Notes (Signed)
CSN: [id number]     Arrival date & time 06/30/14  2057 History  This chart was scribed for Gerhard Munchobert Tylena Prisk, MD by Modena JanskyAlbert Thayil, ED Scribe. This patient was seen in room MH06/MH06 and the patient's care was started at 10:21 PM.   Chief Complaint  Patient presents with  . Fever  . Abdominal Pain   The history is provided by the patient. No language interpreter was used.   HPI Comments: Susan Mcpherson is a 38 y.o. female with a hx of bladder infection, migraines and seizures who presents to the Emergency Department complaining of constant moderate RLQ abdominal pain that started yesterday. She states that the pain radiates to her lower back. She states that she had associated subjective fever last night. Pt's temperature in the ED is 98.1. She states that she had one episode of emesis and some nausea. She reports that she has two fractured ribs on her right side from an injury about a month ago. She reports that she has a hx of an ectopic pregnancy.   Past Medical History  Diagnosis Date  . Migraine   . Ectopic pregnancy   . Seizures    Past Surgical History  Procedure Laterality Date  . Gyn surgery    . Lumps in rt breast    . Rhinoplasty     Family History  Problem Relation Age of Onset  . Hypertension Father    History  Substance Use Topics  . Smoking status: Never Smoker   . Smokeless tobacco: Never Used  . Alcohol Use: No   OB History    No data available     Review of Systems  Constitutional: Positive for fever.       Per HPI, otherwise negative  HENT:       Per HPI, otherwise negative  Respiratory:       Per HPI, otherwise negative  Cardiovascular:       Per HPI, otherwise negative  Gastrointestinal: Positive for nausea, vomiting and abdominal pain.  Endocrine:       Negative aside from HPI  Genitourinary:       Neg aside from HPI   Musculoskeletal:       Per HPI, otherwise negative  Skin: Negative.   Neurological: Negative for syncope.    Allergies   Cinnamon; Codeine; and Latex  Home Medications   Prior to Admission medications   Medication Sig Start Date End Date Taking? Authorizing Provider  butalbital-acetaminophen-caffeine (FIORICET WITH CODEINE) 50-325-40-30 MG per capsule Take 1 capsule by mouth every 4 (four) hours as needed for headache.    Historical Provider, MD  diazepam (VALIUM) 5 MG tablet Take 1 tablet (5 mg total) by mouth every 6 (six) hours as needed for muscle spasms. 06/12/14   Mirian MoMatthew Gentry, MD  oxyCODONE-acetaminophen (PERCOCET/ROXICET) 5-325 MG per tablet Take 1 tablet by mouth every 4 (four) hours as needed for severe pain. 06/12/14   Mirian MoMatthew Gentry, MD  topiramate (TOPAMAX) 100 MG tablet Take 100 mg by mouth 2 (two) times daily.    Historical Provider, MD   BP 147/96 mmHg  Pulse 90  Temp(Src) 98.1 F (36.7 C) (Oral)  Resp 20  Ht 5\' 2"  (1.575 m)  Wt 136 lb (61.689 kg)  BMI 24.87 kg/m2  SpO2 97%  LMP 06/23/2014 Physical Exam  Constitutional: She is oriented to person, place, and time. She appears well-developed and well-nourished. No distress.  HENT:  Head: Normocephalic and atraumatic.  Eyes: Conjunctivae and EOM are normal.  Cardiovascular: Normal rate and regular rhythm.   Pulmonary/Chest: Effort normal and breath sounds normal. No stridor. No respiratory distress.  Abdominal: She exhibits no distension. There is tenderness.  Right sided TTP.   Musculoskeletal: She exhibits no edema.  Neurological: She is alert and oriented to person, place, and time. No cranial nerve deficit.  Skin: Skin is warm and dry.  Psychiatric: She has a normal mood and affect.  Nursing note and vitals reviewed.   ED Course  Procedures (including critical care time) COORDINATION OF CARE: 10:25 PM- Pt advised of plan for treatment which includes medication, radiology, and labs and pt agrees.  Labs Review Labs Reviewed  URINALYSIS, ROUTINE W REFLEX MICROSCOPIC - Abnormal; Notable for the following:    APPearance  CLOUDY (*)    Hgb urine dipstick LARGE (*)    Protein, ur 100 (*)    Nitrite POSITIVE (*)    Leukocytes, UA MODERATE (*)    All other components within normal limits  URINE MICROSCOPIC-ADD ON - Abnormal; Notable for the following:    Bacteria, UA MANY (*)    All other components within normal limits  PREGNANCY, URINE    Imaging Review Ct Renal Stone Study  06/30/2014   CLINICAL DATA:  Right-sided abdominal pain, nausea, vomiting, and diarrhea.  EXAM: CT ABDOMEN AND PELVIS WITHOUT CONTRAST  TECHNIQUE: Multidetector CT imaging of the abdomen and pelvis was performed following the standard protocol without IV contrast.  COMPARISON:  None.  FINDINGS: Lung bases are clear.  2 mm stone in the lower pole right kidney. No hydronephrosis or hydroureter. No ureteral stones or bladder stones identified.  The unenhanced appearance of the liver, spleen, gallbladder, pancreas, adrenal glands, abdominal aorta, inferior vena cava, and retroperitoneal lymph nodes is unremarkable. Stomach and small bowel are decompressed. Stool-filled colon without distention. No free air or free fluid in the abdomen. Mesenteric lymph nodes are not not abnormally enlarged.  Pelvis: Uterus and ovaries are not enlarged. Intrauterine device is present. No free or loculated pelvic fluid collections. No pelvic mass or lymphadenopathy. Bladder wall is not thickened. Appendix is normal. No evidence of diverticulitis. No destructive bone lesions.  IMPRESSION: 2 mm nonobstructing stone in the lower pole of the right kidney. No ureteral stone or obstruction identified.   Electronically Signed   By: Burman NievesWilliam  Stevens M.D.   On: 06/30/2014 22:58    On repeat exam the patient appears more calm. MDM   Patient presents with suprapubic, right sided abdominal pain.  Patient's evaluation suggests pyelonephritis. There is no evidence for obstructive kidney stone, appendicitis. Patient was started on antibiotics, discharged in stable condition to  follow up with primary care. I personally performed the services described in this documentation, which was scribed in my presence. The recorded information has been reviewed and is accurate.     Gerhard Munchobert Ryanne Morand, MD 06/30/14 [phone]

## 2014-06-30 NOTE — Discharge Instructions (Signed)
As discussed, your evaluation here suggests that you have pyelonephritis.  This is an inflammation/infection of the genitourinary tract.  Please take all medication as directed, and monitor your condition carefully.  Please be sure to follow-up with your physician for further evaluation and management.  Return here for concerning changes in your condition.

## 2014-07-01 ENCOUNTER — Telehealth: Payer: Self-pay | Admitting: Family

## 2014-07-01 NOTE — Telephone Encounter (Signed)
Please arrange ED follow up in 3-5 days.

## 2014-07-03 ENCOUNTER — Emergency Department (HOSPITAL_BASED_OUTPATIENT_CLINIC_OR_DEPARTMENT_OTHER): Payer: Medicaid Other

## 2014-07-03 ENCOUNTER — Emergency Department (HOSPITAL_BASED_OUTPATIENT_CLINIC_OR_DEPARTMENT_OTHER)
Admission: EM | Admit: 2014-07-03 | Discharge: 2014-07-03 | Disposition: A | Discharge status: Home / Self Care | Payer: Medicaid Other | Attending: Emergency Medicine | Admitting: Emergency Medicine

## 2014-07-03 ENCOUNTER — Encounter (HOSPITAL_BASED_OUTPATIENT_CLINIC_OR_DEPARTMENT_OTHER): Payer: Self-pay | Admitting: *Deleted

## 2014-07-03 DIAGNOSIS — R1084 Generalized abdominal pain: Secondary | ICD-10-CM | POA: Insufficient documentation

## 2014-07-03 DIAGNOSIS — K625 Hemorrhage of anus and rectum: Secondary | ICD-10-CM | POA: Diagnosis not present

## 2014-07-03 DIAGNOSIS — R109 Unspecified abdominal pain: Secondary | ICD-10-CM | POA: Diagnosis present

## 2014-07-03 DIAGNOSIS — Z79899 Other long term (current) drug therapy: Secondary | ICD-10-CM | POA: Insufficient documentation

## 2014-07-03 DIAGNOSIS — K5900 Constipation, unspecified: Secondary | ICD-10-CM | POA: Insufficient documentation

## 2014-07-03 DIAGNOSIS — G43909 Migraine, unspecified, not intractable, without status migrainosus: Secondary | ICD-10-CM | POA: Diagnosis not present

## 2014-07-03 DIAGNOSIS — G40909 Epilepsy, unspecified, not intractable, without status epilepticus: Secondary | ICD-10-CM | POA: Insufficient documentation

## 2014-07-03 DIAGNOSIS — Z792 Long term (current) use of antibiotics: Secondary | ICD-10-CM | POA: Diagnosis not present

## 2014-07-03 LAB — WET PREP, GENITAL
TRICH WET PREP: NONE SEEN
Yeast Wet Prep HPF POC: NONE SEEN

## 2014-07-03 LAB — CBC WITH DIFFERENTIAL/PLATELET
BASOS ABS: 0 10*3/uL (ref 0.0–0.1)
Basophils Relative: 1 % (ref 0–1)
EOS ABS: 0.1 10*3/uL (ref 0.0–0.7)
Eosinophils Relative: 3 % (ref 0–5)
HCT: 35.2 % — ABNORMAL LOW (ref 36.0–46.0)
Hemoglobin: 11.9 g/dL — ABNORMAL LOW (ref 12.0–15.0)
Lymphocytes Relative: 43 % (ref 12–46)
Lymphs Abs: 2.3 10*3/uL (ref 0.7–4.0)
MCH: 31.2 pg (ref 26.0–34.0)
MCHC: 33.8 g/dL (ref 30.0–36.0)
MCV: 92.1 fL (ref 78.0–100.0)
Monocytes Absolute: 0.5 10*3/uL (ref 0.1–1.0)
Monocytes Relative: 9 % (ref 3–12)
Neutro Abs: 2.4 10*3/uL (ref 1.7–7.7)
Neutrophils Relative %: 44 % (ref 43–77)
PLATELETS: 234 10*3/uL (ref 150–400)
RBC: 3.82 MIL/uL — ABNORMAL LOW (ref 3.87–5.11)
RDW: 11.9 % (ref 11.5–15.5)
WBC: 5.2 10*3/uL (ref 4.0–10.5)

## 2014-07-03 LAB — COMPREHENSIVE METABOLIC PANEL
ALBUMIN: 3.6 g/dL (ref 3.5–5.2)
ALT: 18 U/L (ref 0–35)
AST: 13 U/L (ref 0–37)
Alkaline Phosphatase: 73 U/L (ref 39–117)
Anion gap: 10 (ref 5–15)
BUN: 21 mg/dL (ref 6–23)
CALCIUM: 9.4 mg/dL (ref 8.4–10.5)
CO2: 29 mEq/L (ref 19–32)
Chloride: 103 mEq/L (ref 96–112)
Creatinine, Ser: 0.8 mg/dL (ref 0.50–1.10)
GFR calc Af Amer: >90 mL/min (ref >90)
GFR calc non Af Amer: >90 mL/min (ref >90)
Glucose, Bld: 84 mg/dL (ref 70–99)
Potassium: 4.1 mEq/L (ref 3.7–5.3)
SODIUM: 142 meq/L (ref 137–147)
TOTAL PROTEIN: 7.3 g/dL (ref 6.0–8.3)
Total Bilirubin: <0.2 mg/dL — ABNORMAL LOW (ref 0.3–1.2)

## 2014-07-03 LAB — URINALYSIS, ROUTINE W REFLEX MICROSCOPIC
Bilirubin Urine: NEGATIVE
GLUCOSE, UA: NEGATIVE mg/dL
HGB URINE DIPSTICK: NEGATIVE
Ketones, ur: NEGATIVE mg/dL
Leukocytes, UA: NEGATIVE
Nitrite: NEGATIVE
PH: 5 (ref 5.0–8.0)
Protein, ur: NEGATIVE mg/dL
SPECIFIC GRAVITY, URINE: 1.026 (ref 1.005–1.030)
UROBILINOGEN UA: 0.2 mg/dL (ref 0.0–1.0)

## 2014-07-03 LAB — OCCULT BLOOD X 1 CARD TO LAB, STOOL: Fecal Occult Bld: POSITIVE — AB

## 2014-07-03 MED ORDER — DICYCLOMINE HCL 20 MG PO TABS: 20.0000 mg | ORAL_TABLET | Freq: Two times a day (BID) | ORAL | Status: DC

## 2014-07-03 MED ORDER — ONDANSETRON HCL 4 MG/2ML IJ SOLN
4.0000 mg | Freq: Once | INTRAMUSCULAR | Status: AC
  Administered 2014-07-03: 4 mg via INTRAVENOUS
  Filled 2014-07-03: qty 2

## 2014-07-03 MED ORDER — ONDANSETRON 4 MG PO TBDP: 4.0000 mg | ORAL_TABLET | Freq: Three times a day (TID) | ORAL | Status: DC | PRN

## 2014-07-03 MED ORDER — HYDROMORPHONE HCL 1 MG/ML IJ SOLN
1.0000 mg | Freq: Once | INTRAMUSCULAR | Status: AC
  Administered 2014-07-03: 1 mg via INTRAVENOUS
  Filled 2014-07-03: qty 1

## 2014-07-03 NOTE — Discharge Instructions (Signed)
You may take Metamucil once a day, Mirapex once a day and Colace tablets 1 mg daily. All of these are found over-the-counter. If this is not enough to help with regular bowel movements, you may use glycerin suppositories, Fleet enemas or magnesium citrate over-the-counter.   Bloody Stools Bloody stools often mean that there is a problem in the digestive tract. Your caregiver may use the term "melena" to describe black, tarry, and bad smelling stools or "hematochezia" to describe red or maroon-colored stools. Blood seen in the stool can be caused by bleeding anywhere along the intestinal tract.  A black stool usually means that blood is coming from the upper part of the gastrointestinal tract (esophagus, stomach, or small bowel). Passing maroon-colored stools or bright red blood usually means that blood is coming from lower down in the large bowel or the rectum. However, sometimes massive bleeding in the stomach or small intestine can cause bright red bloody stools.  Consuming black licorice, lead, iron pills, medicines containing bismuth subsalicylate, or blueberries can also cause black stools. Your caregiver can test black stools to see if blood is present. It is important that the cause of the bleeding be found. Treatment can then be started, and the problem can be corrected. Rectal bleeding may not be serious, but you should not assume everything is okay until you know the cause.It is very important to follow up with your caregiver or a specialist in gastrointestinal problems. CAUSES  Blood in the stools can come from various underlying causes.Often, the cause is not found during your first visit. Testing is often needed to discover the cause of bleeding in the gastrointestinal tract. Causes range from simple to serious or even life-threatening.Possible causes include:  Hemorrhoids.These are veins that are full of blood (engorged) in the rectum. They cause pain, inflammation, and may  bleed.  Anal fissures.These are areas of painful tearing which may bleed. They are often caused by passing hard stool.  Diverticulosis.These are pouches that form on the colon over time, with age, and may bleed significantly.  Diverticulitis.This is inflammation in areas with diverticulosis. It can cause pain, fever, and bloody stools, although bleeding is rare.  Proctitis and colitis. These are inflamed areas of the rectum or colon. They may cause pain, fever, and bloody stools.  Polyps and cancer. Colon cancer is a leading cause of preventable cancer death.It often starts out as precancerous polyps that can be removed during a colonoscopy, preventing progression into cancer. Sometimes, polyps and cancer may cause rectal bleeding.  Gastritis and ulcers.Bleeding from the upper gastrointestinal tract (near the stomach) may travel through the intestines and produce black, sometimes tarry, often bad smelling stools. In certain cases, if the bleeding is fast enough, the stools may not be black, but red and the condition may be life-threatening. SYMPTOMS  You may have stools that are bright red and bloody, that are normal color with blood on them, or that are dark black and tarry. In some cases, you may only have blood in the toilet bowl. Any of these cases need medical care. You may also have:  Pain at the anus or anywhere in the rectum.  Lightheadedness or feeling faint.  Extreme weakness.  Nausea or vomiting.  Fever. DIAGNOSIS Your caregiver may use the following methods to find the cause of your bleeding:  Taking a medical history. Age is important. Older people tend to develop polyps and cancer more often. If there is anal pain and a hard, large stool associated with bleeding,  a tear of the anus may be the cause. If blood drips into the toilet after a bowel movement, bleeding hemorrhoids may be the problem. The color and frequency of the bleeding are additional considerations. In  most cases, the medical history provides clues, but seldom the final answer.  A visual and finger (digital) exam. Your caregiver will inspect the anal area, looking for tears and hemorrhoids. A finger exam can provide information when there is tenderness or a growth inside. In men, the prostate is also examined.  Endoscopy. Several types of small, long scopes (endoscopes) are used to view the colon.  In the office, your caregiver may use a rigid, or more commonly, a flexible viewing sigmoidoscope. This exam is called flexible sigmoidoscopy. It is performed in 5 to 10 minutes.  A more thorough exam is accomplished with a colonoscope. It allows your caregiver to view the entire 5 to 6 foot long colon. Medicine to help you relax (sedative) is usually given for this exam. Frequently, a bleeding lesion may be present beyond the reach of the sigmoidoscope. So, a colonoscopy may be the best exam to start with. Both exams are usually done on an outpatient basis. This means the patient does not stay overnight in the hospital or surgery center.  An upper endoscopy may be needed to examine your stomach. Sedation is used and a flexible endoscope is put in your mouth, down to your stomach.  A barium enema X-ray. This is an X-ray exam. It uses liquid barium inserted by enema into the rectum. This test alone may not identify an actual bleeding point. X-rays highlight abnormal shadows, such as those made by lumps (tumors), diverticuli, or colitis. TREATMENT  Treatment depends on the cause of your bleeding.   For bleeding from the stomach or colon, the caregiver doing your endoscopy or colonoscopy may be able to stop the bleeding as part of the procedure.  Inflammation or infection of the colon can be treated with medicines.  Many rectal problems can be treated with creams, suppositories, or warm baths.  Surgery is sometimes needed.  Blood transfusions are sometimes needed if you have lost a lot of  blood.  For any bleeding problem, let your caregiver know if you take aspirin or other blood thinners regularly. HOME CARE INSTRUCTIONS   Take any medicines exactly as prescribed.  Keep your stools soft by eating a diet high in fiber. Prunes (1 to 3 a day) work well for many people.  Drink enough water and fluids to keep your urine clear or pale yellow.  Take sitz baths if advised. A sitz bath is when you sit in a bathtub with warm water for 10 to 15 minutes to soak, soothe, and cleanse the rectal area.  If enemas or suppositories are advised, be sure you know how to use them. Tell your caregiver if you have problems with this.  Monitor your bowel movements to look for signs of improvement or worsening. SEEK MEDICAL CARE IF:   You do not improve in the time expected.  Your condition worsens after initial improvement.  You develop any new symptoms. SEEK IMMEDIATE MEDICAL CARE IF:   You develop severe or prolonged rectal bleeding.  You vomit blood.  You feel weak or faint.  You have a fever. MAKE SURE YOU:  Understand these instructions.  Will watch your condition.  Will get help right away if you are not doing well or get worse. Document Released: 08/01/2002 Document Revised: 11/03/2011 Document Reviewed: 12/27/2010 ExitCare  Patient Information 2015 [place], [place]. This information is not intended to replace advice given to you by your health care provider. Make sure you discuss any questions you have with your health care provider.    Constipation Constipation is when a person has fewer than three bowel movements a week, has difficulty having a bowel movement, or has stools that are dry, hard, or larger than normal. As people grow older, constipation is more common. If you try to fix constipation with medicines that make you have a bowel movement (laxatives), the problem may get worse. Long-term laxative use may cause the muscles of the colon to become weak. A low-fiber  diet, not taking in enough fluids, and taking certain medicines may make constipation worse.  CAUSES   Certain medicines, such as antidepressants, pain medicine, iron supplements, antacids, and water pills.   Certain diseases, such as diabetes, irritable bowel syndrome (IBS), thyroid disease, or depression.   Not drinking enough water.   Not eating enough fiber-rich foods.   Stress or travel.   Lack of physical activity or exercise.   Ignoring the urge to have a bowel movement.   Using laxatives too much.  SIGNS AND SYMPTOMS   Having fewer than three bowel movements a week.   Straining to have a bowel movement.   Having stools that are hard, dry, or larger than normal.   Feeling full or bloated.   Pain in the lower abdomen.   Not feeling relief after having a bowel movement.  DIAGNOSIS  Your health care provider will take a medical history and perform a physical exam. Further testing may be done for severe constipation. Some tests may include:  A barium enema X-ray to examine your rectum, colon, and, sometimes, your small intestine.   A sigmoidoscopy to examine your lower colon.   A colonoscopy to examine your entire colon. TREATMENT  Treatment will depend on the severity of your constipation and what is causing it. Some dietary treatments include drinking more fluids and eating more fiber-rich foods. Lifestyle treatments may include regular exercise. If these diet and lifestyle recommendations do not help, your health care provider may recommend taking over-the-counter laxative medicines to help you have bowel movements. Prescription medicines may be prescribed if over-the-counter medicines do not work.  HOME CARE INSTRUCTIONS   Eat foods that have a lot of fiber, such as fruits, vegetables, whole grains, and beans.  Limit foods high in fat and processed sugars, such as french fries, hamburgers, cookies, candies, and soda.   A fiber supplement may be  added to your diet if you cannot get enough fiber from foods.   Drink enough fluids to keep your urine clear or pale yellow.   Exercise regularly or as directed by your health care provider.   Go to the restroom when you have the urge to go. Do not hold it.   Only take over-the-counter or prescription medicines as directed by your health care provider. Do not take other medicines for constipation without talking to your health care provider first.  SEEK IMMEDIATE MEDICAL CARE IF:   You have bright red blood in your stool.   Your constipation lasts for more than 4 days or gets worse.   You have abdominal or rectal pain.   You have thin, pencil-like stools.   You have unexplained weight loss. MAKE SURE YOU:   Understand these instructions.  Will watch your condition.  Will get help right away if you are not doing well or get  worse. Document Released: 05/09/2004 Document Revised: 08/16/2013 Document Reviewed: 05/23/2013 [facility] Patient Information 2015 [place], [place]. This information is not intended to replace advice given to you by your health care provider. Make sure you discuss any questions you have with your health care provider.    High-Fiber Diet Fiber is found in fruits, vegetables, and grains. A high-fiber diet encourages the addition of more whole grains, legumes, fruits, and vegetables in your diet. The recommended amount of fiber for adult males is 38 g per day. For adult females, it is 25 g per day. Pregnant and lactating women should get 28 g of fiber per day. If you have a digestive or bowel problem, ask your caregiver for advice before adding high-fiber foods to your diet. Eat a variety of high-fiber foods instead of only a select few type of foods.  PURPOSE  To increase stool bulk.  To make bowel movements more regular to prevent constipation.  To lower cholesterol.  To prevent overeating. WHEN IS THIS DIET USED?  It may be used if you have  constipation and hemorrhoids.  It may be used if you have uncomplicated diverticulosis (intestine condition) and irritable bowel syndrome.  It may be used if you need help with weight management.  It may be used if you want to add it to your diet as a protective measure against atherosclerosis, diabetes, and cancer. SOURCES OF FIBER  Whole-grain breads and cereals.  Fruits, such as apples, oranges, bananas, berries, prunes, and pears.  Vegetables, such as green peas, carrots, sweet potatoes, beets, broccoli, cabbage, spinach, and artichokes.  Legumes, such split peas, soy, lentils.  Almonds. FIBER CONTENT IN FOODS Starches and Grains / Dietary Fiber (g)  Cheerios, 1 cup / 3 g  Corn Flakes cereal, 1 cup / 0.7 g  Rice crispy treat cereal, 1 cup / 0.3 g  Instant oatmeal (cooked),  cup / 2 g  Frosted wheat cereal, 1 cup / 5.1 g  Brown, long-grain rice (cooked), 1 cup / 3.5 g  White, long-grain rice (cooked), 1 cup / 0.6 g  Enriched macaroni (cooked), 1 cup / 2.5 g Legumes / Dietary Fiber (g)  Baked beans (canned, plain, or vegetarian),  cup / 5.2 g  Kidney beans (canned),  cup / 6.8 g  Pinto beans (cooked),  cup / 5.5 g Breads and Crackers / Dietary Fiber (g)  Plain or honey graham crackers, 2 squares / 0.7 g  Saltine crackers, 3 squares / 0.3 g  Plain, salted pretzels, 10 pieces / 1.8 g  Whole-wheat bread, 1 slice / 1.9 g  White bread, 1 slice / 0.7 g  Raisin bread, 1 slice / 1.2 g  Plain bagel, 3 oz / 2 g  Flour tortilla, 1 oz / 0.9 g  Corn tortilla, 1 small / 1.5 g  Hamburger or hotdog bun, 1 small / 0.9 g Fruits / Dietary Fiber (g)  Apple with skin, 1 medium / 4.4 g  Sweetened applesauce,  cup / 1.5 g  Banana,  medium / 1.5 g  Grapes, 10 grapes / 0.4 g  Orange, 1 small / 2.3 g  Raisin, 1.5 oz / 1.6 g  Melon, 1 cup / 1.4 g Vegetables / Dietary Fiber (g)  Green beans (canned),  cup / 1.3 g  Carrots (cooked),  cup / 2.3  g  Broccoli (cooked),  cup / 2.8 g  Peas (cooked),  cup / 4.4 g  Mashed potatoes,  cup / 1.6 g  Lettuce, 1 cup /  0.5 g  Corn (canned),  cup / 1.6 g  Tomato,  cup / 1.1 g Document Released: 08/11/2005 Document Revised: 02/10/2012 Document Reviewed: 11/13/2011 [facility] Patient Information 2015 [place], [place]. This information is not intended to replace advice given to you by your health care provider. Make sure you discuss any questions you have with your health care provider.

## 2014-07-03 NOTE — ED Notes (Signed)
Blood in mucus in her stool. Right lower quadrant pain. She was seen here 3 days ago and diagnosed with pyelonephritis.

## 2014-07-03 NOTE — ED Notes (Signed)
Reports 6 BMs today and has had blood and mucous in every bowel movement.  Reports throbbing right lower quad pain.

## 2014-07-03 NOTE — Telephone Encounter (Signed)
Patient [place] and will be finding another provider.

## 2014-07-03 NOTE — ED Provider Notes (Signed)
TIME SEEN: 10:10 PM   CHIEF COMPLAINT: Abdominal pain  HPI: HPI Comments: Susan Mcpherson is a 38 y.o. female with PIMhx of Migraines, ectopic pregnancy and seizures who presents to the Emergency Department complaining of recurrent RLQ abdominal pain 4 days ago. She states the pain had resolved over the weekend but then returned today but is not any worse than it was before. She states she has new associated blood and mucous in her stool. She was here on 06/30/04/2015 she was diagnosed with possible pyelonephritis and discharged home on antibiotics which she states she has been taking.  Denies any recent fevers or chills. Denies any recent nausea or vomiting.  No diarrhea.  ROS: See HPI Constitutional: no fever or chills  Eyes: no drainage  ENT: no runny nose   Cardiovascular:  no chest pain  Resp: no SOB  GI: abdominal pain,no recent nausea and vomiting  GU: no dysuria Integumentary: no rash  Allergy: no hives  Musculoskeletal: no leg swelling  Neurological: no slurred speech ROS otherwise negative  PAST MEDICAL HISTORY/PAST SURGICAL HISTORY:  Past Medical History  Diagnosis Date  . Migraine   . Ectopic pregnancy   . Seizures     MEDICATIONS:  Prior to Admission medications   Medication Sig Start Date End Date Taking? Authorizing Provider  butalbital-acetaminophen-caffeine (FIORICET WITH CODEINE) 50-325-40-30 MG per capsule Take 1 capsule by mouth every 4 (four) hours as needed for headache.    Historical Provider, MD  diazepam (VALIUM) 5 MG tablet Take 1 tablet (5 mg total) by mouth every 6 (six) hours as needed for muscle spasms. 06/12/14   Mirian MoMatthew Gentry, MD  levofloxacin (LEVAQUIN) 750 MG tablet Take 1 tablet (750 mg total) by mouth daily. 07/01/14 07/07/14  Gerhard Munchobert Lockwood, MD  oxyCODONE-acetaminophen (PERCOCET/ROXICET) 5-325 MG per tablet Take 1 tablet by mouth every 6 (six) hours as needed for severe pain. 06/30/14   Gerhard Munchobert Lockwood, MD  topiramate (TOPAMAX) 100 MG tablet  Take 100 mg by mouth 2 (two) times daily.    Historical Provider, MD    ALLERGIES:  Allergies  Allergen Reactions  . Cinnamon   . Codeine   . Latex     SOCIAL HISTORY:  History  Substance Use Topics  . Smoking status: Never Smoker   . Smokeless tobacco: Never Used  . Alcohol Use: No    FAMILY HISTORY: Family History  Problem Relation Age of Onset  . Hypertension Father     EXAM: BP 148/83 mmHg  Pulse 71  Temp(Src) 98.7 F (37.1 C) (Oral)  Resp 20  Ht 5\' 2"  (1.575 m)  Wt 136 lb (61.689 kg)  BMI 24.87 kg/m2  SpO2 100%  LMP 06/23/2014 CONSTITUTIONAL: Alert and oriented and responds appropriately to questions. Well-appearing; well-nourished HEAD: Normocephalic EYES: Conjunctivae clear, PERRL ENT: normal nose; no rhinorrhea; moist mucous membranes; pharynx without lesions noted NECK: Supple, no meningismus, no LAD  CARD: RRR; S1 and S2 appreciated; no murmurs, no clicks, no rubs, no gallops RESP: Normal chest excursion without splinting or tachypnea; breath sounds clear and equal bilaterally; no wheezes, no rhonchi, no rales,  ABD/GI: Normal bowel sounds; non-distended; soft,tender to palpation throughout the right side of her abdomen without guarding or rebound, negative Murphy sign, no peritoneal signs GU:  Normal external genitalia, no cervical motion tenderness, no adnexal tenderness or fullness, no amount of thin white vaginal discharge, no vaginal bleeding BACK:  The back appears normal and is non-tender to palpation, there is no CVA tenderness EXT: Normal  ROM in all joints; non-tender to palpation; no edema; normal capillary refill; no cyanosis    SKIN: Normal color for age and race; warm NEURO: Moves all extremities equally PSYCH: The patient's mood and manner are appropriate. Grooming and personal hygiene are appropriate.  MEDICAL DECISION MAKING: Pt here with recurrent right-sided abdominal pain. She was recently diagnosed with pyelonephritis with a nitrite  positive urinary infection. She had a non-contrast CT scan on 06/30/14 which showed a normal gallbladder, normal appendix and normal kidneys.  At that time she did have moderate stool noted in the colon. We'll repeat labs, urine today. Pelvic exam is unremarkable. Cultures pending. Give IV fluids, pain and nausea medication.  ED PROGRESS: Patient reports feeling much better after pain medication. She is tolerating by mouth without difficulty. Labs are unchanged. Urine shows no sign of infection. Pelvic cultures show a should has bacterial vaginosis. Will treat with Flagyl. Have had a long discussion with patient and family that I do not feel she needs repeat imaging at this time as I feel that there will not be a significant change. She does have a small amount of bright red blood on rectal exam and this may be secondary to constipation. Have advised her to avoid any narcotic pain medications at home and have recommended a strict bowel regimen. Have discussed with her ever that if her bleeding continues I recommend that she follow up with gastroenterology. I do not feel she needs an emergent colonoscopy at this time as her hemoglobin is normal and she is hemodynamically stable. Have discussed strict return precautions. She verbalizes understanding is comfortable with this plan.     I personally performed the services described in this documentation, which was scribed in my presence. The recorded information has been reviewed and is accurate.     Layla MawKristen N Ward, DO 07/04/14 [phone]

## 2014-07-05 LAB — GC/CHLAMYDIA PROBE AMP
CT PROBE, AMP APTIMA: NEGATIVE
GC Probe RNA: NEGATIVE

## 2014-07-13 ENCOUNTER — Emergency Department
Admission: EM | Admit: 2014-07-13 | Discharge: 2014-07-13 | Disposition: A | Discharge status: Home / Self Care | Payer: Medicaid Other | Source: Home / Self Care

## 2014-07-13 ENCOUNTER — Encounter: Payer: Self-pay | Admitting: *Deleted

## 2014-07-13 DIAGNOSIS — B3731 Acute candidiasis of vulva and vagina: Secondary | ICD-10-CM

## 2014-07-13 DIAGNOSIS — B373 Candidiasis of vulva and vagina: Secondary | ICD-10-CM

## 2014-07-13 MED ORDER — NYSTATIN 100000 UNIT/GM EX CREA: TOPICAL_CREAM | CUTANEOUS | Status: DC

## 2014-07-13 MED ORDER — FLUCONAZOLE 150 MG PO TABS: ORAL_TABLET | ORAL | Status: DC

## 2014-07-13 NOTE — ED Provider Notes (Addendum)
CSN: [id number]     Arrival date & time 07/13/14  1209 History   None    Chief Complaint  Patient presents with  . Rash   Pt c/o pruritic rash to buttocks, perineal, vulvar area that has started to spread x 3 days. C/o itching. Used "baby butt paste" with relief. Denies pain. HPI Was recently seen at emergency room last week for pyelonephritis, treated effectively with antibiotic. I reviewed those extensive notes. Pregnancy test was negative. GC and Chlamydia swabs at time of pelvic exam in ER were negative. Wet prep was negative except few clue cells, likely not significant.  She states that ever since she finished the antibiotic for pyelonephritis and those pyelonephritis/UTI symptoms have resolved. Denies fever or chills. No history of STD. LMP normal 06/23/2014.  She states she usually gets a yeast infection after taking antibiotics. Past Medical History  Diagnosis Date  . Migraine   . Ectopic pregnancy   . Seizures    Past Surgical History  Procedure Laterality Date  . Gyn surgery    . Lumps in rt breast    . Rhinoplasty     Family History  Problem Relation Age of Onset  . Hypertension Father    History  Substance Use Topics  . Smoking status: Never Smoker   . Smokeless tobacco: Never Used  . Alcohol Use: No   OB History    No data available     Review of Systems  All other systems reviewed and are negative.   Allergies  Cinnamon; Codeine; and Latex  Home Medications   Prior to Admission medications   Medication Sig Start Date End Date Taking? Authorizing Provider  butalbital-acetaminophen-caffeine (FIORICET WITH CODEINE) 50-325-40-30 MG per capsule Take 1 capsule by mouth every 4 (four) hours as needed for headache.    Historical Provider, MD  diazepam (VALIUM) 5 MG tablet Take 1 tablet (5 mg total) by mouth every 6 (six) hours as needed for muscle spasms. 06/12/14   Mirian MoMatthew Gentry, MD  dicyclomine (BENTYL) 20 MG tablet Take 1 tablet (20 mg total) by  mouth 2 (two) times daily. 07/03/14   Kristen N Ward, DO  fluconazole (DIFLUCAN) 150 MG tablet Take 1 by mouth daily for 7 days for yeast infection 07/13/14   Lajean Manesavid Massey, MD  nystatin cream (MYCOSTATIN) Apply to affected area 2 times daily 07/13/14   Lajean Manesavid Massey, MD  ondansetron (ZOFRAN ODT) 4 MG disintegrating tablet Take 1 tablet (4 mg total) by mouth every 8 (eight) hours as needed for nausea or vomiting. 07/03/14   Kristen N Ward, DO  oxyCODONE-acetaminophen (PERCOCET/ROXICET) 5-325 MG per tablet Take 1 tablet by mouth every 6 (six) hours as needed for severe pain. 06/30/14   Gerhard Munchobert Lockwood, MD  topiramate (TOPAMAX) 100 MG tablet Take 100 mg by mouth 2 (two) times daily.    Historical Provider, MD   BP 132/89 mmHg  Pulse 69  Temp(Src) 98 F (36.7 C) (Oral)  Resp 12  Wt 139 lb (63.05 kg)  SpO2 98%  LMP 06/23/2014 Physical Exam  Constitutional: She is oriented to person, place, and time. She appears well-developed and well-nourished. No distress.  HENT:  Head: Normocephalic and atraumatic.  Eyes: Conjunctivae and EOM are normal. Pupils are equal, round, and reactive to light. No scleral icterus.  Neck: Normal range of motion.  Cardiovascular: Normal rate.   Pulmonary/Chest: Effort normal.  Abdominal: She exhibits no distension.  Genitourinary:     With nurse chaperone Lovey Newcomer(KL), visual inspection shows erythematous  candida appearing red rash perineal area. Few satellite lesions. No definite vesicles or ulcerations or other lesions.  Today, She declined any swabbing for any testing or cultures.  Musculoskeletal: Normal range of motion.  Neurological: She is alert and oriented to person, place, and time.  Skin: Skin is warm. Rash (see GU) noted.  Psychiatric: She has a normal mood and affect.  Nursing note and vitals reviewed.   ED Course  Procedures (including critical care time) Labs Review Labs Reviewed - No data to display  Results for orders placed or performed during the  hospital encounter of 07/03/14  Wet prep, genital  Result Value Ref Range   Yeast Wet Prep HPF POC NONE SEEN NONE SEEN   Trich, Wet Prep NONE SEEN NONE SEEN   Clue Cells Wet Prep HPF POC FEW (A) NONE SEEN   WBC, Wet Prep HPF POC FEW (A) NONE SEEN  GC/Chlamydia Probe Amp  Result Value Ref Range   CT Probe RNA NEGATIVE NEGATIVE   GC Probe RNA NEGATIVE NEGATIVE  CBC with Differential  Result Value Ref Range   WBC 5.2 4.0 - 10.5 K/uL   RBC 3.82 (L) 3.87 - 5.11 MIL/uL   Hemoglobin 11.9 (L) 12.0 - 15.0 g/dL   HCT 16.1 (L) 09.6 - 04.5 %   MCV 92.1 78.0 - 100.0 fL   MCH 31.2 26.0 - 34.0 pg   MCHC 33.8 30.0 - 36.0 g/dL   RDW 40.9 81.1 - 91.4 %   Platelets 234 150 - 400 K/uL   Neutrophils Relative % 44 43 - 77 %   Neutro Abs 2.4 1.7 - 7.7 K/uL   Lymphocytes Relative 43 12 - 46 %   Lymphs Abs 2.3 0.7 - 4.0 K/uL   Monocytes Relative 9 3 - 12 %   Monocytes Absolute 0.5 0.1 - 1.0 K/uL   Eosinophils Relative 3 0 - 5 %   Eosinophils Absolute 0.1 0.0 - 0.7 K/uL   Basophils Relative 1 0 - 1 %   Basophils Absolute 0.0 0.0 - 0.1 K/uL  Comprehensive metabolic panel  Result Value Ref Range   Sodium 142 137 - 147 mEq/L   Potassium 4.1 3.7 - 5.3 mEq/L   Chloride 103 96 - 112 mEq/L   CO2 29 19 - 32 mEq/L   Glucose, Bld 84 70 - 99 mg/dL   BUN 21 6 - 23 mg/dL   Creatinine, Ser 7.82 0.50 - 1.10 mg/dL   Calcium 9.4 8.4 - 95.6 mg/dL   Total Protein 7.3 6.0 - 8.3 g/dL   Albumin 3.6 3.5 - 5.2 g/dL   AST 13 0 - 37 U/L   ALT 18 0 - 35 U/L   Alkaline Phosphatase 73 39 - 117 U/L   Total Bilirubin <0.2 (L) 0.3 - 1.2 mg/dL   GFR calc non Af Amer >90 >90 mL/min   GFR calc Af Amer >90 >90 mL/min   Anion gap 10 5 - 15  Urinalysis, Routine w reflex microscopic  Result Value Ref Range   Color, Urine YELLOW YELLOW   APPearance CLEAR CLEAR   Specific Gravity, Urine 1.026 1.005 - 1.030   pH 5.0 5.0 - 8.0   Glucose, UA NEGATIVE NEGATIVE mg/dL   Hgb urine dipstick NEGATIVE NEGATIVE   Bilirubin Urine  NEGATIVE NEGATIVE   Ketones, ur NEGATIVE NEGATIVE mg/dL   Protein, ur NEGATIVE NEGATIVE mg/dL   Urobilinogen, UA 0.2 0.0 - 1.0 mg/dL   Nitrite NEGATIVE NEGATIVE   Leukocytes, UA NEGATIVE NEGATIVE  Occult blood card to lab, stool  Result Value Ref Range   Fecal Occult Bld POSITIVE (A) NEGATIVE     MDM   1. Candidal vulvovaginitis   Over 25 minutes spent, greater than 50% of the time spent for counseling and coordination of care.  Treatment options discussed, as well as risks, benefits, alternatives. Patient voiced understanding and agreement with the following plans: New Prescriptions   FLUCONAZOLE (DIFLUCAN) 150 MG TABLET    Take 1 by mouth daily for 7 days for yeast infection   NYSTATIN CREAM (MYCOSTATIN)    Apply to affected area 2 times daily   For itch, options discussed, and she prefers OTC Zyrtec or Claritin for itch. Explained that there is no sign of secondary infection, but precautions and red flags discussed. She'll be following up with urologist within the next 2-4 weeks Follow-up with your gyn  in 5-7 days if rash not improving, or sooner if symptoms become worse. Precautions discussed. Red flags discussed. Questions invited and answered. Patient voiced understanding and agreement.      Lajean Manesavid Massey, MD 07/13/14 1253  Lajean Manesavid Massey, MD 07/13/14 1255

## 2014-07-13 NOTE — ED Notes (Signed)
Pt c/o rash to buttocks that has started to spread x 3 days. C/o itching. Used "baby butt paste" with relief.

## 2014-08-24 ENCOUNTER — Encounter: Payer: Self-pay | Admitting: Emergency Medicine

## 2014-08-24 ENCOUNTER — Emergency Department
Admission: EM | Admit: 2014-08-24 | Discharge: 2014-08-24 | Disposition: A | Discharge status: Home / Self Care | Payer: Medicaid Other | Source: Home / Self Care | Attending: Family Medicine | Admitting: Family Medicine

## 2014-08-24 ENCOUNTER — Emergency Department (INDEPENDENT_AMBULATORY_CARE_PROVIDER_SITE_OTHER): Payer: Medicaid Other

## 2014-08-24 DIAGNOSIS — S2239XA Fracture of one rib, unspecified side, initial encounter for closed fracture: Secondary | ICD-10-CM

## 2014-08-24 DIAGNOSIS — R0781 Pleurodynia: Secondary | ICD-10-CM

## 2014-08-24 DIAGNOSIS — S2231XA Fracture of one rib, right side, initial encounter for closed fracture: Secondary | ICD-10-CM

## 2014-08-24 DIAGNOSIS — X58XXXA Exposure to other specified factors, initial encounter: Secondary | ICD-10-CM

## 2014-08-24 MED ORDER — KETOROLAC TROMETHAMINE 60 MG/2ML IM SOLN
60.0000 mg | Freq: Once | INTRAMUSCULAR | Status: AC
  Administered 2014-08-24: 60 mg via INTRAMUSCULAR

## 2014-08-24 MED ORDER — HYDROCODONE-ACETAMINOPHEN 5-325 MG PO TABS: 1.0000 | ORAL_TABLET | Freq: Four times a day (QID) | ORAL | Status: DC | PRN

## 2014-08-24 MED ORDER — DICLOFENAC SODIUM 50 MG PO TBEC: 50.0000 mg | DELAYED_RELEASE_TABLET | Freq: Two times a day (BID) | ORAL | Status: DC | PRN

## 2014-08-24 MED ORDER — HYDROCODONE-ACETAMINOPHEN 5-325 MG PO TABS: 1.0000 | ORAL_TABLET | Freq: Once | ORAL | Status: DC

## 2014-08-24 NOTE — Discharge Instructions (Signed)
Thank you for coming in today. Follow up with Dr.Basset or Katrinka BlazingSmith for rib fracture management.  Take 2000 mg calcium and 1000 units of vitamin D a day.  Take diclofenac for pain and Norco for severe pain. Do not drive after taking Norco. Use the incentive spirometer every 30-60 minutes while awake. Use a rib binder as needed   Rib Fracture A rib fracture is a break or crack in one of the bones of the ribs. The ribs are a group of long, curved bones that wrap around your chest and attach to your spine. They protect your lungs and other organs in the chest cavity. A broken or cracked rib is often painful, but most do not cause other problems. Most rib fractures heal on their own over time. However, rib fractures can be more serious if multiple ribs are broken or if broken ribs move out of place and push against other structures. CAUSES   A direct blow to the chest. For example, this could happen during contact sports, a car accident, or a fall against a hard object.  Repetitive movements with high force, such as pitching a baseball or having severe coughing spells. SYMPTOMS   Pain when you breathe in or cough.  Pain when someone presses on the injured area. DIAGNOSIS  Your caregiver will perform a physical exam. Various imaging tests may be ordered to confirm the diagnosis and to look for related injuries. These tests may include a chest X-ray, computed tomography (CT), magnetic resonance imaging (MRI), or a bone scan. TREATMENT  Rib fractures usually heal on their own in 1-3 months. The longer healing period is often associated with a continued cough or other aggravating activities. During the healing period, pain control is very important. Medication is usually given to control pain. Hospitalization or surgery may be needed for more severe injuries, such as those in which multiple ribs are broken or the ribs have moved out of place.  HOME CARE INSTRUCTIONS   Avoid strenuous activity and any  activities or movements that cause pain. Be careful during activities and avoid bumping the injured rib.  Gradually increase activity as directed by your caregiver.  Only take over-the-counter or prescription medications as directed by your caregiver. Do not take other medications without asking your caregiver first.  Apply ice to the injured area for the first 1-2 days after you have been treated or as directed by your caregiver. Applying ice helps to reduce inflammation and pain.  Put ice in a plastic bag.  Place a towel between your skin and the bag.   Leave the ice on for 15-20 minutes at a time, every 2 hours while you are awake.  Perform deep breathing as directed by your caregiver. This will help prevent pneumonia, which is a common complication of a broken rib. Your caregiver may instruct you to:  Take deep breaths several times a day.  Try to cough several times a day, holding a pillow against the injured area.  Use a device called an incentive spirometer to practice deep breathing several times a day.  Drink enough fluids to keep your urine clear or pale yellow. This will help you avoid constipation.   Do not wear a rib belt or binder. These restrict breathing, which can lead to pneumonia.  SEEK IMMEDIATE MEDICAL CARE IF:   You have a fever.   You have difficulty breathing or shortness of breath.   You develop a continual cough, or you cough up thick or bloody  sputum.  You feel sick to your stomach (nausea), throw up (vomit), or have abdominal pain.   You have worsening pain not controlled with medications.  MAKE SURE YOU:  Understand these instructions.  Will watch your condition.  Will get help right away if you are not doing well or get worse. Document Released: 08/11/2005 Document Revised: 04/13/2013 Document Reviewed: 10/13/2012 [facility] Patient Information 2015 [employer] [place]. This information is not intended to replace advice given to you by your  health care provider. Make sure you discuss any questions you have with your health care provider.

## 2014-08-24 NOTE — ED Provider Notes (Signed)
Susan GlatterLeah B Mcpherson is a 38 y.o. female who presents to Urgent Care today for rib fracture. Patient was at her normal state of health today when she was at the gym doing one leg and squats on a lifting machine. She felt a pop in her right lower anterior rib and had immediate pain. She suffered a similar fracture in the same area 2 months ago. She denies any fevers or chills vomiting or diarrhea. She had pain is severe and worse with deep inspiration. No shortness of breath.   Past Medical History  Diagnosis Date  . Migraine   . Ectopic pregnancy   . Seizures    Past Surgical History  Procedure Laterality Date  . Gyn surgery    . Lumps in rt breast    . Rhinoplasty     History  Substance Use Topics  . Smoking status: Never Smoker   . Smokeless tobacco: Never Used  . Alcohol Use: No   ROS as above Medications: Current Facility-Administered Medications  Medication Dose Route Frequency Provider Last Rate Last Dose  . HYDROcodone-acetaminophen (NORCO/VICODIN) 5-325 MG per tablet 1 tablet  1 tablet Oral Once Susan BongEvan S Corey, MD       Current Outpatient Prescriptions  Medication Sig Dispense Refill  . butalbital-acetaminophen-caffeine (FIORICET WITH CODEINE) 50-325-40-30 MG per capsule Take 1 capsule by mouth every 4 (four) hours as needed for headache.    . diazepam (VALIUM) 5 MG tablet Take 1 tablet (5 mg total) by mouth every 6 (six) hours as needed for muscle spasms. 10 tablet 0  . diclofenac (VOLTAREN) 50 MG EC tablet Take 1 tablet (50 mg total) by mouth 2 (two) times daily as needed. 60 tablet 0  . dicyclomine (BENTYL) 20 MG tablet Take 1 tablet (20 mg total) by mouth 2 (two) times daily. 20 tablet 0  . HYDROcodone-acetaminophen (NORCO/VICODIN) 5-325 MG per tablet Take 1 tablet by mouth every 6 (six) hours as needed. 15 tablet 0  . oxyCODONE-acetaminophen (PERCOCET/ROXICET) 5-325 MG per tablet Take 1 tablet by mouth every 6 (six) hours as needed for severe pain. 10 tablet 0  . topiramate  (TOPAMAX) 100 MG tablet Take 100 mg by mouth 2 (two) times daily.     Allergies  Allergen Reactions  . Cinnamon   . Codeine   . Latex      Exam:  BP 146/94 mmHg  Pulse 68  Temp(Src) 98 F (36.7 C) (Oral)  Resp 16  Ht 5\' 2"  (1.575 m)  Wt 137 lb (62.143 kg)  BMI 25.05 kg/m2  SpO2 100%  LMP 08/12/2014 Gen: Well NAD in pain appearing HEENT: EOMI,  MMM Lungs: Normal work of breathing. CTABL Heart: RRR no MRG Chest wall: Significant tender to palpation right anterior lower chest wall Abd: NABS, Soft. Nondistended, Nontender Exts: Brisk capillary refill, warm and well perfused.   No results found for this or any previous visit (from the past 24 hour(s)). Dg Ribs Unilateral W/chest Right  08/24/2014   CLINICAL DATA:  Pain in the right lower anterior rib area  EXAM: RIGHT RIBS AND CHEST - 3+ VIEW  COMPARISON:  06/12/2014  FINDINGS: Subacute to chronic healing anterior right tenth rib fracture is identified. No acute rib fractures identified. There is no evidence of pneumothorax or pleural effusion. Both lungs are clear. Heart size and mediastinal contours are within normal limits.  IMPRESSION: Subacute to chronic right anterior tenth rib fracture.   Electronically Signed   By: Signa Kellaylor  Stroud M.D.   On:  08/24/2014 17:43    Assessment and Plan: 38 y.o. female with acute on chronic rib fracture. Plan for rib binder Norco Voltaren and follow-up with sports medicine. This is the patient's third fracture (second nontraumatic)  in one year. I'm concerned for bone demineralization. Plan to start vitamin D and calcium.  Discussed warning signs or symptoms. Please see discharge instructions. Patient expresses understanding.     Susan BongEvan S Corey, MD 08/24/14 [phone]

## 2014-08-24 NOTE — ED Notes (Signed)
Reports re-injuring right rib area lifting weights today; had fracture of 2 ribs in this spot in October, 2015.

## 2014-10-10 ENCOUNTER — Other Ambulatory Visit: Payer: Self-pay

## 2014-10-10 DIAGNOSIS — N6310 Unspecified lump in the right breast, unspecified quadrant: Secondary | ICD-10-CM

## 2015-07-05 ENCOUNTER — Emergency Department (HOSPITAL_COMMUNITY)
Admission: EM | Admit: 2015-07-05 | Discharge: 2015-07-06 | Disposition: A | Discharge status: Home / Self Care | Payer: Medicaid Other | Attending: Emergency Medicine | Admitting: Emergency Medicine

## 2015-07-05 ENCOUNTER — Encounter (HOSPITAL_COMMUNITY): Payer: Self-pay | Admitting: Emergency Medicine

## 2015-07-05 ENCOUNTER — Emergency Department (HOSPITAL_COMMUNITY): Payer: Medicaid Other

## 2015-07-05 DIAGNOSIS — Z79899 Other long term (current) drug therapy: Secondary | ICD-10-CM | POA: Insufficient documentation

## 2015-07-05 DIAGNOSIS — G43809 Other migraine, not intractable, without status migrainosus: Secondary | ICD-10-CM | POA: Diagnosis not present

## 2015-07-05 DIAGNOSIS — Z91040 Latex allergy status: Secondary | ICD-10-CM | POA: Diagnosis not present

## 2015-07-05 DIAGNOSIS — Z3202 Encounter for pregnancy test, result negative: Secondary | ICD-10-CM | POA: Insufficient documentation

## 2015-07-05 DIAGNOSIS — G43909 Migraine, unspecified, not intractable, without status migrainosus: Secondary | ICD-10-CM | POA: Diagnosis present

## 2015-07-05 LAB — URINALYSIS, ROUTINE W REFLEX MICROSCOPIC
BILIRUBIN URINE: NEGATIVE
GLUCOSE, UA: NEGATIVE mg/dL
HGB URINE DIPSTICK: NEGATIVE
Ketones, ur: NEGATIVE mg/dL
Nitrite: NEGATIVE
Protein, ur: NEGATIVE mg/dL
Specific Gravity, Urine: 1.021 (ref 1.005–1.030)
UROBILINOGEN UA: 0.2 mg/dL (ref 0.0–1.0)
pH: 6.5 (ref 5.0–8.0)

## 2015-07-05 LAB — CBC
HCT: 37.5 % (ref 36.0–46.0)
HEMOGLOBIN: 12.9 g/dL (ref 12.0–15.0)
MCH: 31.7 pg (ref 26.0–34.0)
MCHC: 34.4 g/dL (ref 30.0–36.0)
MCV: 92.1 fL (ref 78.0–100.0)
PLATELETS: 218 10*3/uL (ref 150–400)
RBC: 4.07 MIL/uL (ref 3.87–5.11)
RDW: 13.3 % (ref 11.5–15.5)
WBC: 6.5 10*3/uL (ref 4.0–10.5)

## 2015-07-05 LAB — RAPID URINE DRUG SCREEN, HOSP PERFORMED
AMPHETAMINES: NOT DETECTED
BENZODIAZEPINES: NOT DETECTED
Barbiturates: NOT DETECTED
COCAINE: NOT DETECTED
OPIATES: NOT DETECTED
TETRAHYDROCANNABINOL: NOT DETECTED

## 2015-07-05 LAB — BASIC METABOLIC PANEL
ANION GAP: 7 (ref 5–15)
BUN: 19 mg/dL (ref 6–20)
CALCIUM: 8.9 mg/dL (ref 8.9–10.3)
CO2: 25 mmol/L (ref 22–32)
CREATININE: 0.77 mg/dL (ref 0.44–1.00)
Chloride: 108 mmol/L (ref 101–111)
Glucose, Bld: 114 mg/dL — ABNORMAL HIGH (ref 65–99)
Potassium: 3.8 mmol/L (ref 3.5–5.1)
SODIUM: 140 mmol/L (ref 135–145)

## 2015-07-05 LAB — URINE MICROSCOPIC-ADD ON

## 2015-07-05 LAB — CBG MONITORING, ED: Glucose-Capillary: 115 mg/dL — ABNORMAL HIGH (ref 65–99)

## 2015-07-05 LAB — POC URINE PREG, ED: PREG TEST UR: NEGATIVE

## 2015-07-05 LAB — I-STAT CG4 LACTIC ACID, ED: LACTIC ACID, VENOUS: 0.64 mmol/L (ref 0.5–2.0)

## 2015-07-05 LAB — ETHANOL: Alcohol, Ethyl (B): <5 mg/dL (ref <5)

## 2015-07-05 MED ORDER — PROCHLORPERAZINE EDISYLATE 5 MG/ML IJ SOLN
10.0000 mg | Freq: Once | INTRAMUSCULAR | Status: AC
  Administered 2015-07-05: 10 mg via INTRAVENOUS
  Filled 2015-07-05: qty 2

## 2015-07-05 MED ORDER — SODIUM CHLORIDE 0.9 % IV BOLUS (SEPSIS): 1000.0000 mL | Freq: Once | INTRAVENOUS | Status: DC

## 2015-07-05 MED ORDER — SODIUM CHLORIDE 0.9 % IV BOLUS (SEPSIS)
1000.0000 mL | Freq: Once | INTRAVENOUS | Status: AC
  Administered 2015-07-05: 1000 mL via INTRAVENOUS

## 2015-07-05 MED ORDER — DEXAMETHASONE SODIUM PHOSPHATE 10 MG/ML IJ SOLN
10.0000 mg | Freq: Once | INTRAMUSCULAR | Status: AC
  Administered 2015-07-05: 10 mg via INTRAVENOUS
  Filled 2015-07-05: qty 1

## 2015-07-05 MED ORDER — KETOROLAC TROMETHAMINE 30 MG/ML IJ SOLN
30.0000 mg | Freq: Once | INTRAMUSCULAR | Status: AC
  Administered 2015-07-05: 30 mg via INTRAVENOUS
  Filled 2015-07-05: qty 1

## 2015-07-05 NOTE — ED Notes (Signed)
Bed: [id number] Expected date:  Expected time:  Means of arrival:  Comments: t2

## 2015-07-05 NOTE — Progress Notes (Signed)
Patient listed as having Medicaid insurance without a pcp.  EDCM spoke to patient's mother at bedside.  Patient asleep.  Patient's mother reports patient recently established care with a new pcp 2-3 moths ago but she cannot remember who it is.  Patient's mother requesting a list [place].  List provided.  No further EDCM needs at this time.

## 2015-07-05 NOTE — ED Notes (Signed)
Delay in urine pt doesn't have to go

## 2015-07-05 NOTE — ED Notes (Signed)
Walked out of patients room. Called back in because patients family reports shaking motions. RN used loud voice to call out to patient, pt alert to voice but with eyes closed. Asked why she is shaking, states "I don't know." No evidence of seizure seen.

## 2015-07-05 NOTE — ED Notes (Signed)
PA at bedside.

## 2015-07-05 NOTE — Discharge Instructions (Signed)
Read the information below.  You may return to the Emergency Department at any time for worsening condition or any new symptoms that concern you.    SEEK MEDICAL ATTENTION IF: The medications don't resolve your headache, if it recurs , or if you have multiple episodes of vomiting or can't take fluids. You have a change from the usual headache. RETURN IMMEDIATELY IF you develop a sudden, severe headache or confusion, become poorly responsive or faint, develop a fever above 100.21F or problem breathing, have a change in speech, vision, swallowing, or understanding, or develop new weakness, numbness, tingling, incoordination, or have a seizure.     Recurrent Migraine Headache A migraine headache is an intense, throbbing pain on one or both sides of your head. Recurrent migraines keep coming back. A migraine can last for 30 minutes to several hours. CAUSES  The exact cause of a migraine headache is not always known. However, a migraine may be caused when nerves in the brain become irritated and release chemicals that cause inflammation. This causes pain. Certain things may also trigger migraines, such as:   Alcohol.  Smoking.  Stress.  Menstruation.  Aged cheeses.  Foods or drinks that contain nitrates, glutamate, aspartame, or tyramine.  Lack of sleep.  Chocolate.  Caffeine.  Hunger.  Physical exertion.  Fatigue.  Medicines used to treat chest pain (nitroglycerine), birth control pills, estrogen, and some blood pressure medicines. SYMPTOMS   Pain on one or both sides of your head.  Pulsating or throbbing pain.  Severe pain that prevents daily activities.  Pain that is aggravated by any physical activity.  Nausea, vomiting, or both.  Dizziness.  Pain with exposure to bright lights, loud noises, or activity.  General sensitivity to bright lights, loud noises, or smells. Before you get a migraine, you may get warning signs that a migraine is coming (aura). An aura  may include:  Seeing flashing lights.  Seeing bright spots, halos, or zigzag lines.  Having tunnel vision or blurred vision.  Having feelings of numbness or tingling.  Having trouble talking.  Having muscle weakness. DIAGNOSIS  A recurrent migraine headache is often diagnosed based on:  Symptoms.  Physical examination.  A CT scan or MRI of your head. These imaging tests cannot diagnose migraines but can help rule out other causes of headaches.  TREATMENT  Medicines may be given for pain and nausea. Medicines can also be given to help prevent recurrent migraines. HOME CARE INSTRUCTIONS  Only take over-the-counter or prescription medicines for pain or discomfort as directed by your health care provider. The use of long-term narcotics is not recommended.  Lie down in a dark, quiet room when you have a migraine.  Keep a journal to find out what may trigger your migraine headaches. For example, write down:  What you eat and drink.  How much sleep you get.  Any change to your diet or medicines.  Limit alcohol consumption.  Quit smoking if you smoke.  Get 7-9 hours of sleep, or as recommended by your health care provider.  Limit stress.  Keep lights dim if bright lights bother you and make your migraines worse. SEEK MEDICAL CARE IF:   You do not get relief from the medicines given to you.  You have a recurrence of pain.  You have a fever. SEEK IMMEDIATE MEDICAL CARE IF:  Your migraine becomes severe.  You have a stiff neck.  You have loss of vision.  You have muscular weakness or loss of muscle control.  You start losing your balance or have trouble walking.  You feel faint or pass out.  You have severe symptoms that are different from your first symptoms. MAKE SURE YOU:   Understand these instructions.  Will watch your condition.  Will get help right away if you are not doing well or get worse.   This information is not intended to replace advice  given to you by your health care provider. Make sure you discuss any questions you have with your health care provider.   Document Released: 05/06/2001 Document Revised: 09/01/2014 Document Reviewed: 04/18/2013 Elsevier Interactive Patient Education [employer]

## 2015-07-05 NOTE — ED Notes (Signed)
Pt from home c/o posterior headache and nausea. Pt falling asleep during triage. Pt reports only taking exedrine today. She reports last time she was like this she had seizures.

## 2015-07-05 NOTE — ED Provider Notes (Signed)
CSN: [id number]     Arrival date & time 07/05/15  1947 History   First MD Initiated Contact with Patient 07/05/15 2118     Chief Complaint  Patient presents with  . Migraine  . Dizziness     (Consider location/radiation/quality/duration/timing/severity/associated sxs/prior Treatment) The history is provided by the patient, a parent and a relative.    Patient with hx migraine headaches (hemiplegic migraines) presents with her typical headache that is lasting longer than normal.  States she developed headache all over her head at 11:30 this morning.  The pain is worse in the right occiput.  Pain described as pressure and painful.  Associated tiredness, lightheadedness, dizziness, nausea, weakness.  States this occurs about once a week - this one came on slightly faster and has lasted a little longer (they usually go away after a few hours), otherwise it is identical to prior migraines.  She has been to EDs in [facility] these, usual 1-2 x per year.  She takes two medications to prevent these headaches.  Has neurologist in [place].  Denies fevers, recent illness, head trauma, neck pain or stiffness, "worst" headache of life, sudden onset or "thunderclap" headache.  Denies focal neurologic deficits. Patient's mother and daughter confirm that this is her typical migraine pattern that they have seen in the past.   Past Medical History  Diagnosis Date  . Migraine   . Ectopic pregnancy   . Seizures [facility](HCC)    Past Surgical History  Procedure Laterality Date  . Gyn surgery    . Lumps in rt breast    . Rhinoplasty     Family History  Problem Relation Age of Onset  . Hypertension Father    Social History  Substance Use Topics  . Smoking status: Never Smoker   . Smokeless tobacco: Never Used  . Alcohol Use: No   OB History    No data available     Review of Systems  All other systems reviewed and are negative.     Allergies  Cinnamon;  Codeine; and Latex  Home Medications   Prior to Admission medications   Medication Sig Start Date End Date Taking? Authorizing Provider  aspirin-acetaminophen-caffeine (EXCEDRIN MIGRAINE) [id number] MG tablet Take 1 tablet by mouth every 6 (six) hours as needed for headache or migraine.   Yes Historical Provider, MD  butalbital-acetaminophen-caffeine (FIORICET WITH CODEINE) 50-325-40-30 MG per capsule Take 1 capsule by mouth every 4 (four) hours as needed for headache.   Yes Historical Provider, MD  topiramate (TOPAMAX) 100 MG tablet Take 100 mg by mouth 2 (two) times daily.   Yes Historical Provider, MD  diazepam (VALIUM) 5 MG tablet Take 1 tablet (5 mg total) by mouth every 6 (six) hours as needed for muscle spasms. Patient not taking: Reported on 07/05/2015 06/12/14   Mirian MoMatthew Gentry, MD  diclofenac (VOLTAREN) 50 MG EC tablet Take 1 tablet (50 mg total) by mouth 2 (two) times daily as needed. Patient not taking: Reported on 07/05/2015 08/24/14   [name], MD  dicyclomine (BENTYL) 20 MG tablet Take 1 tablet (20 mg total) by mouth 2 (two) times daily. Patient not taking: Reported on 07/05/2015 07/03/14   Layla MawKristen N Ward, DO  HYDROcodone-acetaminophen (NORCO/VICODIN) 5-325 MG per tablet Take 1 tablet by mouth every 6 (six) hours as needed. Patient not taking: Reported on 07/05/2015 08/24/14   [name], MD  oxyCODONE-acetaminophen (PERCOCET/ROXICET) 5-325 MG per tablet Take 1 tablet by  mouth every 6 (six) hours as needed for severe pain. Patient not taking: Reported on 07/05/2015 06/30/14   Gerhard Munch, MD   BP 169/89 mmHg  Pulse 59  Temp(Src) 97.9 F (36.6 C) (Oral)  Resp 18  Wt 148 lb (67.132 kg)  SpO2 97%  LMP 06/26/2015 Physical Exam  Constitutional: She appears well-developed and well-nourished. She is sleeping. She is easily aroused. No distress.  She is drowsy and lies with her eyes closed but responds to questioning.    HENT:  Head: Normocephalic and atraumatic.  Neck:  Neck supple.  Cardiovascular: Normal rate and regular rhythm.   Pulmonary/Chest: Effort normal and breath sounds normal. No respiratory distress. She has no wheezes. She has no rales.  Abdominal: Soft. She exhibits no distension. There is no tenderness. There is no rebound and no guarding.  Neurological: She is easily aroused. GCS eye subscore is 4. GCS verbal subscore is 5. GCS motor subscore is 6.  CN II-XII intact, EOMs intact, no pronator drift, grip strengths equal bilaterally; strength 5/5 in all extremities, sensation intact in all extremities; finger to nose.   Skin: She is not diaphoretic.  Nursing note and vitals reviewed.   ED Course  Procedures (including critical care time) Labs Review Labs Reviewed  BASIC METABOLIC PANEL - Abnormal; Notable for the following:    Glucose, Bld 114 (*)    All other components within normal limits  URINALYSIS, ROUTINE W REFLEX MICROSCOPIC (NOT AT [facility]) - Abnormal; Notable for the following:    APPearance CLOUDY (*)    Leukocytes, UA TRACE (*)    All other components within normal limits  CBG MONITORING, ED - Abnormal; Notable for the following:    Glucose-Capillary 115 (*)    All other components within normal limits  CBC  ETHANOL  URINE RAPID DRUG SCREEN, HOSP PERFORMED  URINE MICROSCOPIC-ADD ON  POC URINE PREG, ED  I-STAT CG4 LACTIC ACID, ED    Imaging Review Dg Chest Port 1 View  07/05/2015  CLINICAL DATA:  Headache, nausea and coughing for 1 month. EXAM: PORTABLE CHEST 1 VIEW COMPARISON:  08/16/2014 FINDINGS: The cardiac silhouette, mediastinal and hilar contours are within normal limits given the AP projection of the film. The lungs are clear. No pleural effusion. The bony thorax is intact. IMPRESSION: No acute cardiopulmonary findings. Electronically Signed   By: Rudie Meyer M.D.   On: 07/05/2015 22:35   I have personally reviewed and evaluated these images and lab results as part of my medical decision-making.   EKG  Interpretation None       10:45 PM Discussed pt with Dr Judd Lien.  Will treat with migraine cocktail.    11:52 PM Pt reports great improvement, pain from 9/10 to 5/10.  Her eyes are now open and she communicates much more readily, smiles at me.   States she is ready to go home and sleep.  UA pending.     MDM   Final diagnoses:  Other migraine without status migrainosus, not intractable    Afebrile, nontoxic patient with typical complex migraine.  No red flags including head trauma, fevers, meningeal signs, focal neurologic deficits.  Migraine cocktail  given with relief.    D/C home with PCP/neurology follow up.  Discussed result, findings, treatment, and follow up  with patient.  Pt given return precautions.  Pt verbalizes understanding and agrees with plan.     Trixie Dredge, PA-C 07/06/15 0003  Trixie Dredge, PA-C 07/06/15 0004  Geoffery Lyons, MD 07/09/15 [phone]

## 2015-07-07 IMAGING — CT CT RENAL STONE PROTOCOL
2 of 4 series · 16 of 46 positions shown, 18 images · non-contrast
Comparison: None.

CLINICAL DATA: Right-sided abdominal pain, nausea, vomiting, and
diarrhea.

EXAM:
CT ABDOMEN AND PELVIS WITHOUT CONTRAST
TECHNIQUE: Multidetector CT imaging of the abdomen and pelvis was performed
following the standard protocol without IV contrast.

[Series 2: renal stone < 200 lbs 5.0 b31f · axial · 0.84mm/px · z∈[-604,-204]mm · 13 of 89 slices shown, 15 images]
[im 5/89  soft-tissue]
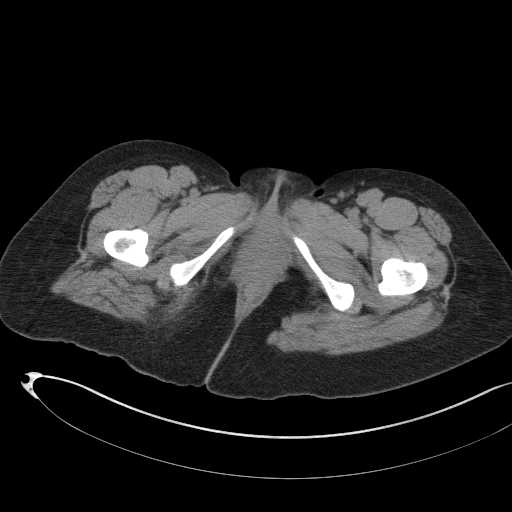
[im 5/89  bone]
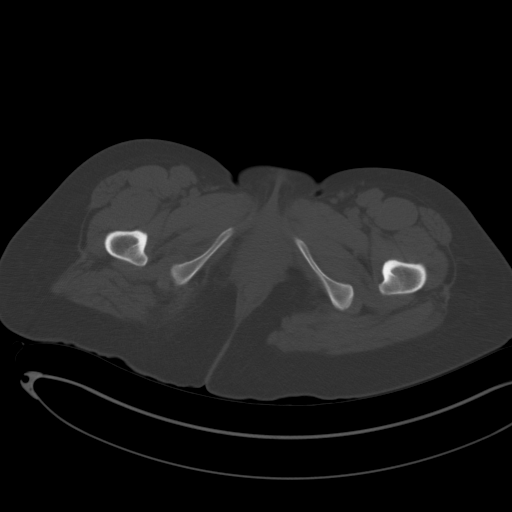
[im 13/89  soft-tissue]
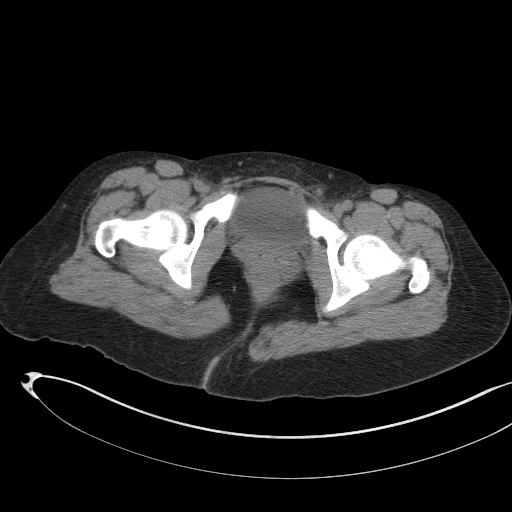
[im 21/89  soft-tissue]
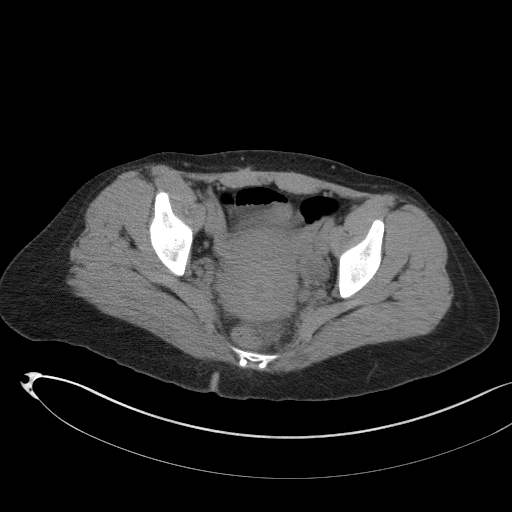
[im 25/89  soft-tissue]
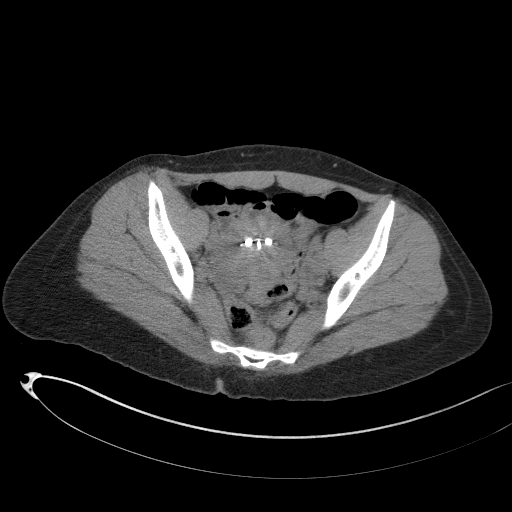
[im 33/89  soft-tissue]
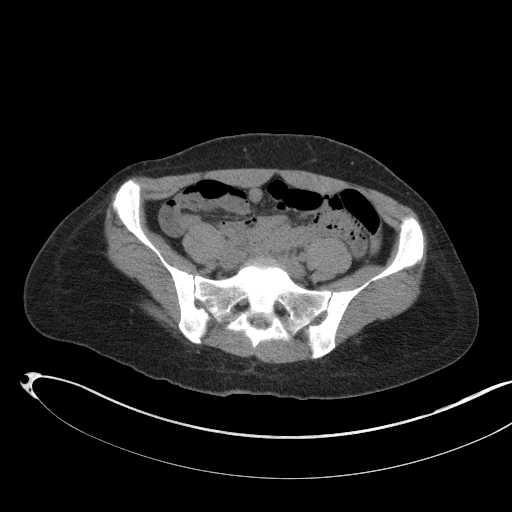
[im 37/89  soft-tissue]
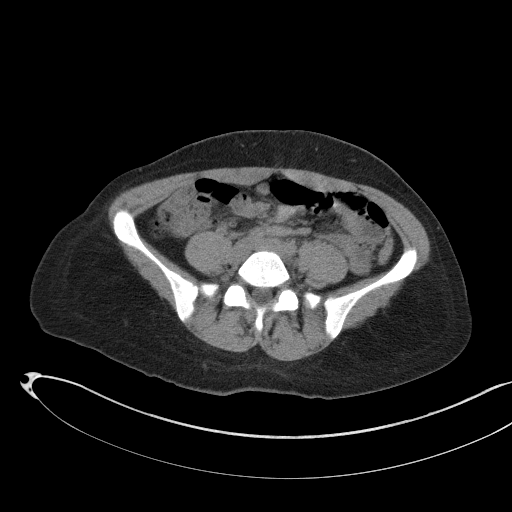
[im 45/89  soft-tissue]
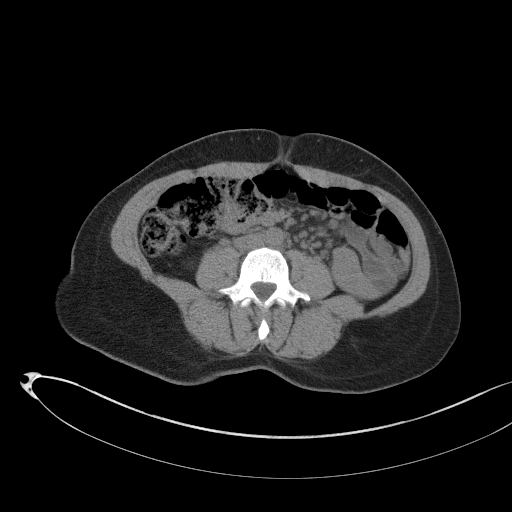
[im 53/89  soft-tissue]
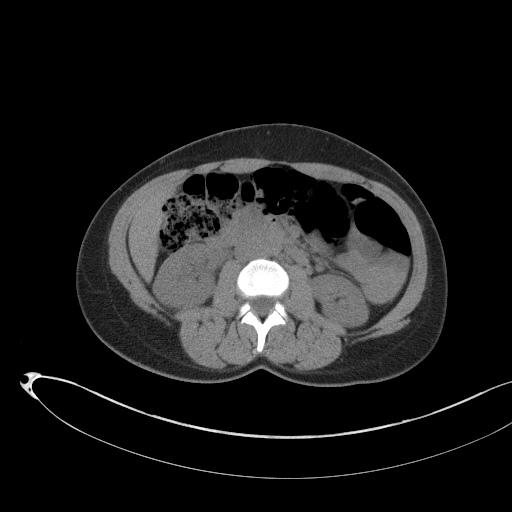
[im 57/89  soft-tissue]
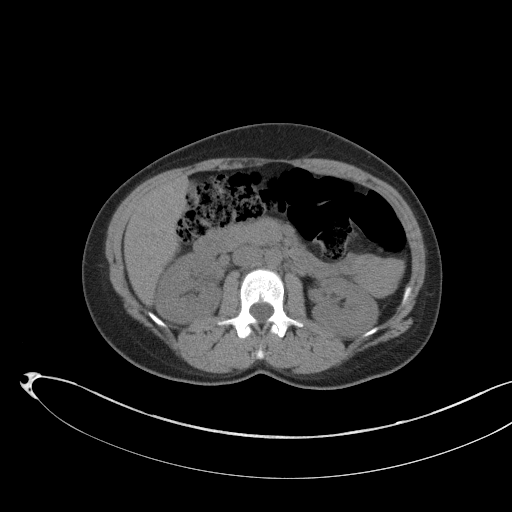
[im 57/89  bone]
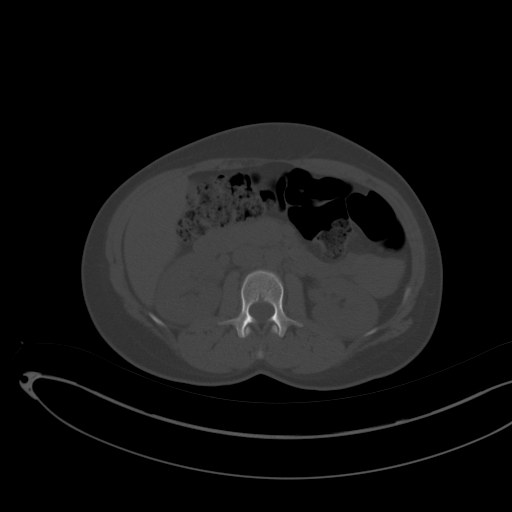
[im 65/89  soft-tissue]
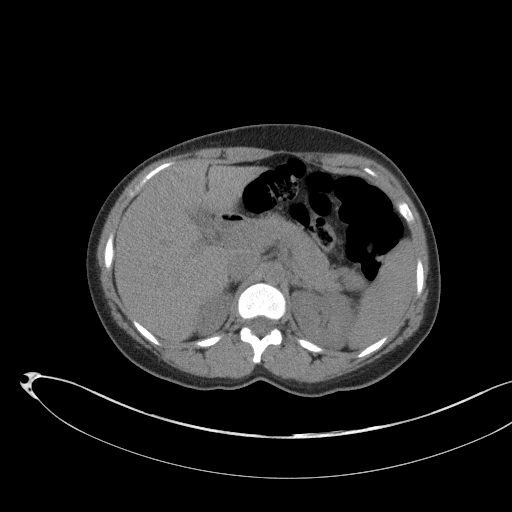
[im 69/89  soft-tissue]
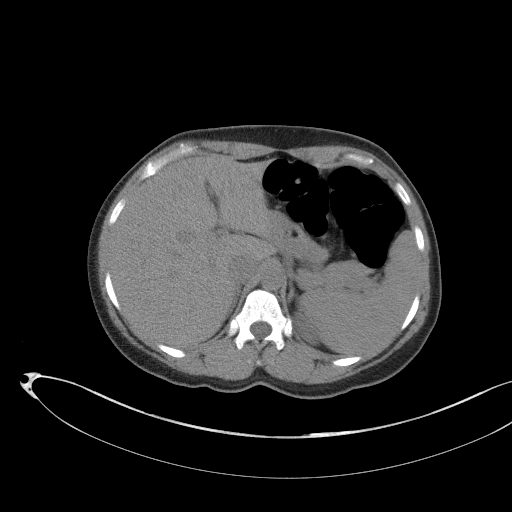
[im 77/89  soft-tissue]
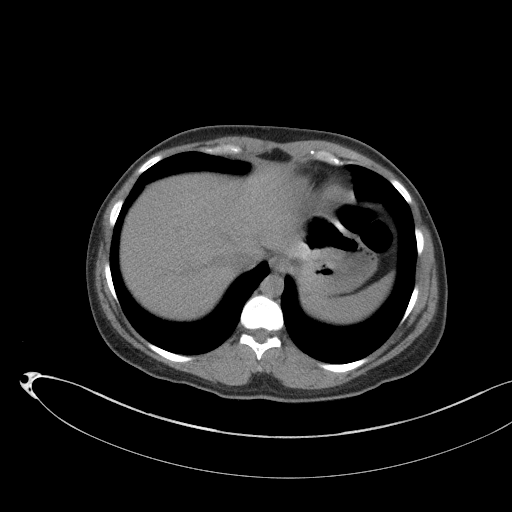
[im 85/89  soft-tissue]
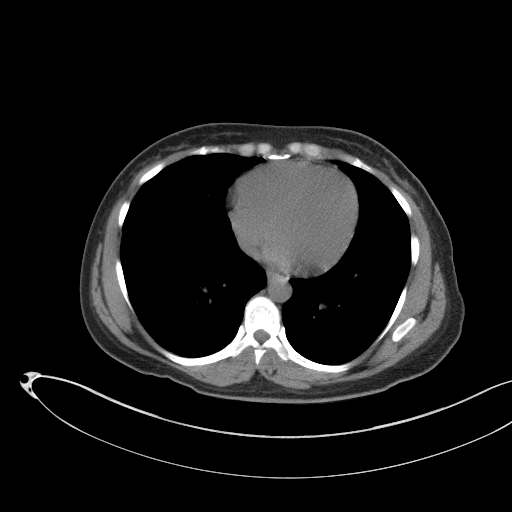

[Series 5: renal stone 3.0 coronal · coronal · 0.72mm/px · 3 of 76 slices shown]
[im 26/76  soft-tissue]
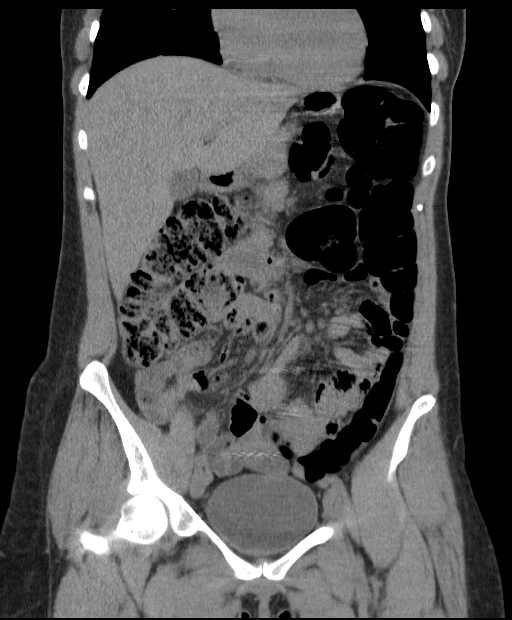
[im 34/76  soft-tissue]
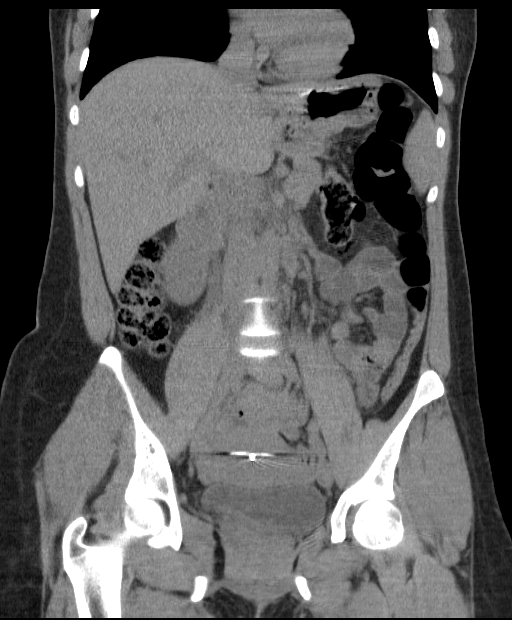
[im 42/76  soft-tissue]
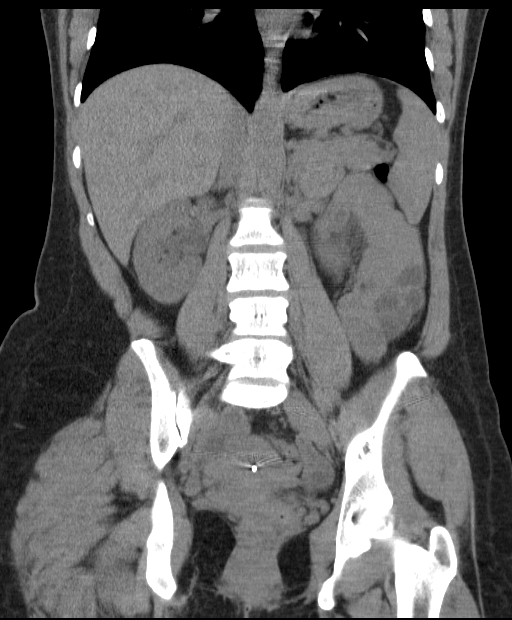

[16 of 46 positions shown; findings below may reference images not displayed]

FINDINGS: Lung bases are clear.

2 mm stone in the lower pole right kidney. No hydronephrosis or
hydroureter. No ureteral stones or bladder stones identified.

The unenhanced appearance of the liver, spleen, gallbladder,
pancreas, adrenal glands, abdominal aorta, inferior vena cava, and
retroperitoneal lymph nodes is unremarkable. Stomach and small bowel
are decompressed. Stool-filled colon without distention. No free air
or free fluid in the abdomen. Mesenteric lymph nodes are not not
abnormally enlarged.

Pelvis: Uterus and ovaries are not enlarged. Intrauterine device is
present. No free or loculated pelvic fluid collections. No pelvic
mass or lymphadenopathy. Bladder wall is not thickened. Appendix is
normal. No evidence of diverticulitis. No destructive bone lesions.
IMPRESSION: 2 mm nonobstructing stone in the lower pole of the right kidney. No
ureteral stone or obstruction identified.

## 2015-09-27 DIAGNOSIS — R030 Elevated blood-pressure reading, without diagnosis of hypertension: Secondary | ICD-10-CM | POA: Insufficient documentation

## 2016-01-15 ENCOUNTER — Encounter: Payer: Self-pay | Admitting: *Deleted

## 2016-01-15 ENCOUNTER — Emergency Department (INDEPENDENT_AMBULATORY_CARE_PROVIDER_SITE_OTHER): Payer: Medicaid Other

## 2016-01-15 ENCOUNTER — Emergency Department
Admission: EM | Admit: 2016-01-15 | Discharge: 2016-01-15 | Disposition: A | Discharge status: Home / Self Care | Payer: Medicaid Other | Source: Home / Self Care | Attending: Family Medicine | Admitting: Family Medicine

## 2016-01-15 DIAGNOSIS — S93602A Unspecified sprain of left foot, initial encounter: Secondary | ICD-10-CM

## 2016-01-15 DIAGNOSIS — M7732 Calcaneal spur, left foot: Secondary | ICD-10-CM

## 2016-01-15 DIAGNOSIS — M25472 Effusion, left ankle: Secondary | ICD-10-CM

## 2016-01-15 DIAGNOSIS — M25572 Pain in left ankle and joints of left foot: Secondary | ICD-10-CM | POA: Diagnosis not present

## 2016-01-15 MED ORDER — KETOROLAC TROMETHAMINE 60 MG/2ML IM SOLN
60.0000 mg | Freq: Once | INTRAMUSCULAR | Status: AC
  Administered 2016-01-15: 60 mg via INTRAMUSCULAR

## 2016-01-15 NOTE — Discharge Instructions (Signed)
Apply ice pack for 20 to 30 minutes, 3 to 4 times daily  Continue until pain decreases.  Wear cam walker until improved.  May use crutches at home.  May take Ibuprofen 200mg , 4 tabs every 8 hours with food.  Begin range of motion and stretching exercises as tolerated.   Foot Sprain A foot sprain is an injury to one of the strong bands of tissue (ligaments) that connect and support the many bones in your feet. The ligament can be stretched too much or it can tear. A tear can be either partial or complete. The severity of the sprain depends on how much of the ligament was damaged or torn. CAUSES A foot sprain is usually caused by suddenly twisting or pivoting your foot. RISK FACTORS This injury is more likely to occur in people who:  Play a sport, such as basketball or football.  Exercise or play a sport without warming up.  Start a new workout or sport.  Suddenly increase how long or hard they exercise or play a sport. SYMPTOMS Symptoms of this condition start soon after an injury and include:  Pain, especially in the arch of the foot.  Bruising.  Swelling.  Inability to walk or use the foot to support body weight. DIAGNOSIS This condition is diagnosed with a medical history and physical exam. You may also have imaging tests, such as:  X-rays to make sure there are no broken bones (fractures).  MRI to see if the ligament has torn. TREATMENT Treatment varies depending on the severity of your sprain. Mild sprains can be treated with rest, ice, compression, and elevation (RICE). If your ligament is overstretched or partially torn, treatment usually involves keeping your foot in a fixed position (immobilization) for a period of time. To help you do this, your health care provider will apply a bandage, splint, or walking boot to keep your foot from moving until it heals. You may also be advised to use crutches or a scooter for a few weeks to avoid bearing weight on your foot while it is  healing. If your ligament is fully torn, you may need surgery to reconnect the ligament to the bone. After surgery, a cast or splint will be applied and will need to stay on your foot while it heals. Your health care provider may also suggest exercises or physical therapy to strengthen your foot. HOME CARE INSTRUCTIONS If You Have a Bandage, Splint, or Walking Boot:  Wear it as directed by your health care provider. Remove it only as directed by your health care provider.  Loosen the bandage, splint, or walking boot if your toes become numb and tingle, or if they turn cold and blue. Bathing  If your health care provider approves bathing and showering, cover the bandage or splint with a watertight plastic bag to protect it from water. Do not let the bandage or splint get wet. Managing Pain, Stiffness, and Swelling   If directed, apply ice to the injured area:  Put ice in a plastic bag.  Place a towel between your skin and the bag.  Leave the ice on for 20 minutes, 2-3 times per day.  Move your toes often to avoid stiffness and to lessen swelling.  Raise (elevate) the injured area above the level of your heart while you are sitting or lying down. Driving  Do not drive or operate heavy machinery while taking pain medicine.  Do not drive while wearing a bandage, splint, or walking boot on a foot  that you use for driving. Activity  Rest as directed by your health care provider.  Do not use the injured foot to support your body weight until your health care provider says that you can. Use crutches or other supportive devices as directed by your health care provider.  Ask your health care provider what activities are safe for you. Gradually increase how much and how far you walk until your health care provider says it is safe to return to full activity.  Do any exercise or physical therapy as directed by your health care provider. General Instructions  If a splint was applied, do  not put pressure on any part of it until it is fully hardened. This may take several hours.  Take medicines only as directed by your health care provider. These include over-the-counter medicines and prescription medicines.  Keep all follow-up visits as directed by your health care provider. This is important.  When you can walk without pain, wear supportive shoes that have stiff soles. Do not wear flip-flops, and do not walk barefoot. SEEK MEDICAL CARE IF:  Your pain is not controlled with medicine.  Your bruising or swelling gets worse or does not get better with treatment.  Your splint or walking boot is damaged. SEEK IMMEDIATE MEDICAL CARE IF:  Your foot is numb or blue.  Your foot feels colder than normal.   This information is not intended to replace advice given to you by your health care provider. Make sure you discuss any questions you have with your health care provider.   Document Released: 01/31/2002 Document Revised: 12/26/2014 Document Reviewed: 06/14/2014 Elsevier Interactive Patient Education [employer]

## 2016-01-15 NOTE — ED Provider Notes (Signed)
CSN: [id number]     Arrival date & time 01/15/16  1039 History   First MD Initiated Contact with Patient 01/15/16 1109     Chief Complaint  Patient presents with  . Foot Injury      HPI Comments: Patient twisted her left foot/ankle on a curb this morning.  She complains of pain over her left great toe, medial foot, and left medial ankle.  Patient is a 40 y.o. female presenting with ankle pain. The history is provided by the patient.  Ankle Pain Location:  Ankle and foot Time since incident:  45 minutes Injury: yes   Mechanism of injury comment:  Twisted on curb Ankle location:  L ankle Foot location:  L foot Pain details:    Quality:  Aching   Radiates to:  Does not radiate   Severity:  Moderate   Onset quality:  Sudden   Duration:  1 hour   Timing:  Constant   Progression:  Unchanged Chronicity:  New Dislocation: no   Prior injury to area:  Yes Relieved by:  None tried Worsened by:  Bearing weight Ineffective treatments:  None tried Associated symptoms: decreased ROM and stiffness   Associated symptoms: no muscle weakness, no numbness, no swelling and no tingling     Past Medical History  Diagnosis Date  . Migraine   . Ectopic pregnancy   . Seizures [facility](HCC)    Past Surgical History  Procedure Laterality Date  . Gyn surgery    . Lumps in rt breast    . Rhinoplasty     Family History  Problem Relation Age of Onset  . Hypertension Father    Social History  Substance Use Topics  . Smoking status: Never Smoker   . Smokeless tobacco: Never Used  . Alcohol Use: No   OB History    No data available     Review of Systems  Musculoskeletal: Positive for stiffness.  All other systems reviewed and are negative.   Allergies  Cinnamon; Codeine; and Latex  Home Medications   Prior to Admission medications   Medication Sig Start Date End Date Taking? Authorizing Provider  norethindrone-ethinyl estradiol-iron (ESTROSTEP FE,TILIA FE,TRI-LEGEST FE) 1-20/1-30/1-35  MG-MCG tablet Take 1 tablet by mouth daily.   Yes Historical Provider, MD   Meds Ordered and Administered this Visit   Medications  ketorolac (TORADOL) injection 60 mg (60 mg Intramuscular Given 01/15/16 1128)    BP 166/95 mmHg  Pulse 57  Wt 137 lb (62.143 kg)  SpO2 100%  LMP 12/23/2015 No data found.   Physical Exam  Constitutional: She is oriented to person, place, and time. She appears well-developed and well-nourished. No distress.  HENT:  Head: Atraumatic.  Eyes: Conjunctivae are normal. Pupils are equal, round, and reactive to light.  Musculoskeletal:       Left foot: There is decreased range of motion, tenderness and bony tenderness. There is no swelling, normal capillary refill and no deformity.       Feet:  Left foot has tenderness to palpation over the first MTP joint and metatarsal. Left ankle has tenderness to palpation over the medial malleolus and course of the posterior tibial tendon.  Neurological: She is alert and oriented to person, place, and time.  Skin: Skin is warm and dry.  Nursing note and vitals reviewed.   ED Course  Procedures  None  Imaging Review Dg Ankle Complete Left  01/15/2016  CLINICAL DATA:  Patient states that she tripped on a curb today, rolling  her left foot and ankle, c/o left medial foot with tenderness over 1st MTP joint and 1st metatarsal, and left medial malleolus ankle pains, hx of previous left foot/ankle injury EXAM: LEFT ANKLE COMPLETE - 3+ VIEW COMPARISON:  12/29/2013 FINDINGS: Slight soft tissue swelling over the lateral malleolus. Plafond and talar dome intact. No malleolar fracture observed. No definite tibiotalar joint effusion. Very small plantar calcaneal spur. IMPRESSION: 1. Small plantar calcaneal spur. 2. Minimal soft tissue swelling overlying the lateral malleolus. 3.  Otherwise, no significant abnormalities are observed. Electronically Signed   By: Gaylyn Rong M.D.   On: 01/15/2016 11:56   Dg Foot Complete  Left  01/15/2016  CLINICAL DATA:  Patient states that she tripped on a curb today, rolling her left foot and ankle, c/o left medial foot with tenderness over 1st MTP joint and 1st metatarsal, and left medial malleolus ankle pains, hx of previous left foot/ankle injury EXAM: LEFT FOOT - COMPLETE 3+ VIEW COMPARISON:  12/29/2013 FINDINGS: Lisfranc joint alignment normal. Metatarsals and phalanges intact. Arches of the foot appear maintained. No foreign body or fracture seen. IMPRESSION: Negative. Electronically Signed   By: Gaylyn Rong M.D.   On: 01/15/2016 11:55      MDM   1. Foot sprain, left, initial encounter; suspect mild sprain of left posterior tibial tendon also    Cam walker dispensed. Apply ice pack for 20 to 30 minutes, 3 to 4 times daily  Continue until pain decreases.  Wear cam walker until improved.  May use crutches at home.  May take Ibuprofen , 4 tabs every 8 hours with food.  Begin range of motion and stretching exercises as tolerated. Followup with Dr. Rodney Langton or Dr. Clementeen [facility] ([facility]) if not improving about two weeks.     Lattie Haw, MD 01/15/16 1230

## 2016-01-15 NOTE — ED Notes (Signed)
Pt reports rolling her left foot while stepping up on a curb about 45 minutes ago. Previous injury to same foot.

## 2016-08-07 ENCOUNTER — Encounter (HOSPITAL_BASED_OUTPATIENT_CLINIC_OR_DEPARTMENT_OTHER): Payer: Self-pay

## 2016-08-07 ENCOUNTER — Emergency Department (HOSPITAL_BASED_OUTPATIENT_CLINIC_OR_DEPARTMENT_OTHER)
Admission: EM | Admit: 2016-08-07 | Discharge: 2016-08-07 | Disposition: A | Discharge status: Home / Self Care | Payer: Medicaid Other | Attending: Emergency Medicine | Admitting: Emergency Medicine

## 2016-08-07 DIAGNOSIS — H66001 Acute suppurative otitis media without spontaneous rupture of ear drum, right ear: Secondary | ICD-10-CM | POA: Diagnosis not present

## 2016-08-07 DIAGNOSIS — J111 Influenza due to unidentified influenza virus with other respiratory manifestations: Secondary | ICD-10-CM | POA: Insufficient documentation

## 2016-08-07 DIAGNOSIS — R69 Illness, unspecified: Secondary | ICD-10-CM

## 2016-08-07 DIAGNOSIS — R509 Fever, unspecified: Secondary | ICD-10-CM | POA: Diagnosis present

## 2016-08-07 LAB — RAPID STREP SCREEN (MED CTR MEBANE ONLY): Streptococcus, Group A Screen (Direct): NEGATIVE

## 2016-08-07 MED ORDER — ACETAMINOPHEN 325 MG PO TABS: 650.0000 mg | ORAL_TABLET | Freq: Once | ORAL | Status: DC

## 2016-08-07 MED ORDER — BENZONATATE 100 MG PO CAPS: 100.0000 mg | ORAL_CAPSULE | Freq: Three times a day (TID) | ORAL | 0 refills | Status: DC

## 2016-08-07 MED ORDER — AMOXICILLIN 500 MG PO CAPS: 1000.0000 mg | ORAL_CAPSULE | Freq: Two times a day (BID) | ORAL | 0 refills | Status: DC

## 2016-08-07 MED ORDER — ACETAMINOPHEN 160 MG/5ML PO SOLN
650.0000 mg | Freq: Once | ORAL | Status: AC
  Administered 2016-08-07: 650 mg via ORAL
  Filled 2016-08-07: qty 20.3

## 2016-08-07 NOTE — ED Provider Notes (Signed)
[facility] Provider Note   CSN: [id number] Arrival date & time: 08/07/16  2056   By signing my name below, I, Clovis PuAvnee Patel, attest that this documentation has been prepared under the direction and in the presence of Melene Planan [place], DO  Electronically Signed: Clovis PuAvnee Patel, ED Scribe. 08/07/16. 9:42 PM.   History   Chief Complaint Chief Complaint  Patient presents with  . URI   The history is provided by the patient. No language interpreter was used.   HPI Comments:  Susan Mcpherson is a 40 y.o. female who presents to the Emergency Department complaining of sudden onset fever (tmax 103) onset several days. Associated symptoms includes trouble breathing, congestion, cough, sore throat and sharp bilateral ear pain. She notes sick contacts at the gym. She has taken tylenol with little relief. Pt denies sinus pain, abdominal pain, emesis, diarrhea, any other associated symptoms and modifying factors at this time.    Past Medical History:  Diagnosis Date  . Ectopic pregnancy   . Migraine   . Seizures (HCC)    not on any medications per MD, last seizure was sometime in 2017    There are no active problems to display for this patient.   Past Surgical History:  Procedure Laterality Date  . Gyn surgery    . Lumps in rt breast    . RHINOPLASTY      OB History    No data available       Home Medications    Prior to Admission medications   Medication Sig Start Date End Date Taking? Authorizing Provider  amoxicillin (AMOXIL) 500 MG capsule Take 2 capsules (1,000 mg total) by mouth 2 (two) times daily. 08/07/16   Melene Planan Vivyan Biggers, DO  benzonatate (TESSALON) 100 MG capsule Take 1 capsule (100 mg total) by mouth every 8 (eight) hours. 08/07/16   Melene Planan Itzamara Casas, DO  norethindrone-ethinyl estradiol-iron (ESTROSTEP FE,TILIA FE,TRI-LEGEST FE) 1-20/1-30/1-35 MG-MCG tablet Take 1 tablet by mouth daily.    Historical Provider, MD    Family History Family History  Problem Relation Age of  Onset  . Hypertension Father     Social History Social History  Substance Use Topics  . Smoking status: Never Smoker  . Smokeless tobacco: Never Used  . Alcohol use No     Allergies   Cinnamon; Codeine; and Latex   Review of Systems Review of Systems  Constitutional: Positive for fever. Negative for chills.  HENT: Positive for congestion, ear pain, postnasal drip and sore throat. Negative for rhinorrhea.   Eyes: Negative for redness and visual disturbance.  Respiratory: Positive for cough. Negative for shortness of breath and wheezing.   Cardiovascular: Negative for chest pain and palpitations.  Gastrointestinal: Negative for abdominal pain, diarrhea, nausea and vomiting.  Genitourinary: Negative for dysuria and urgency.  Musculoskeletal: Negative for arthralgias and myalgias.  Skin: Negative for pallor and wound.  Neurological: Negative for dizziness and headaches.   Physical Exam Updated Vital Signs BP (!) 163/107 (BP Location: Left Arm)   Pulse 71   Temp 99.9 F (37.7 C) (Oral)   Resp 20   Ht 5\' 1"  (1.549 m)   Wt 138 lb (62.6 kg)   LMP 08/05/2016   SpO2 99%   BMI 26.07 kg/m   Physical Exam  Constitutional: She is oriented to person, place, and time. She appears well-developed and well-nourished. No distress.  HENT:  Head: Normocephalic and atraumatic.  Right Ear: Tympanic membrane is bulging. Tympanic membrane is not erythematous. A middle  ear effusion is present.  Nose: Right sinus exhibits no maxillary sinus tenderness and no frontal sinus tenderness. Left sinus exhibits no maxillary sinus tenderness and no frontal sinus tenderness.  Right TM: effusion that appears purulent. Mild bulging, no erythema. Posterior nasal drip, swollen turbinates, no sinus tenderness.   Eyes: EOM are normal. Pupils are equal, round, and reactive to light.  Neck: Normal range of motion. Neck supple.  Cardiovascular: Normal rate and regular rhythm.  Exam reveals no gallop and no  friction rub.   No murmur heard. Pulmonary/Chest: Effort normal and breath sounds normal. She has no wheezes. She has no rales.  Clear lungs.   Abdominal: Soft. She exhibits no distension. There is no tenderness.  Musculoskeletal: She exhibits no edema or tenderness.  Neurological: She is alert and oriented to person, place, and time.  Skin: Skin is warm and dry. She is not diaphoretic.  Psychiatric: She has a normal mood and affect. Her behavior is normal.  Nursing note and vitals reviewed.  ED Treatments / Results  DIAGNOSTIC STUDIES:  Oxygen Saturation is 99% on RA, normal by my interpretation.    COORDINATION OF CARE:  9:38 PM Discussed treatment plan with pt at bedside and pt agreed to plan.  Labs (all labs ordered are listed, but only abnormal results are displayed) Labs Reviewed  RAPID STREP SCREEN (NOT AT [facility])  CULTURE, GROUP A STREP [facility])    EKG  EKG Interpretation None       Radiology No results found.  Procedures Procedures (including critical care time)  Medications Ordered in ED Medications  acetaminophen (TYLENOL) solution 650 mg (650 mg Oral Given 08/07/16 2113)     Initial Impression / Assessment and Plan / ED Course  I have reviewed the triage vital signs and the nursing notes.  Pertinent labs & imaging results that were available during my care of the patient were reviewed by me and considered in my medical decision making (see chart for details).  Clinical Course     40 yo F With a chief complaint of cough congestion fevers and myalgias. Going on for the past 3 or 4 days. Having some bilateral ear pain as well. Multiple sick contacts. On my exam some concern for right-sided otitis media. Will start on antibiotics. Other symptoms sound most likely influenza-like. Symptomatic therapies. PCP follow-up.  10:23 PM:  I have discussed the diagnosis/risks/treatment options with the patient and family and believe the pt to be eligible for discharge  home to follow-up with PCP. We also discussed returning to the ED immediately if new or worsening sx occur. We discussed the sx which are most concerning (e.g., sudden worsening pain, fever, inability to tolerate by mouth) that necessitate immediate return. Medications administered to the patient during their visit and any new prescriptions provided to the patient are listed below.  Medications given during this visit Medications  acetaminophen (TYLENOL) solution 650 mg (650 mg Oral Given 08/07/16 2113)     The patient appears reasonably screen and/or stabilized for discharge and I doubt any other medical condition or other [facility] requiring further screening, evaluation, or treatment in the ED at this time prior to discharge.    Final Clinical Impressions(s) / ED Diagnoses   Final diagnoses:  Acute suppurative otitis media of right ear without spontaneous rupture of tympanic membrane, recurrence not specified  Influenza-like illness    New Prescriptions Discharge Medication List as of 08/07/2016  9:39 PM    START taking these medications   Details  amoxicillin (AMOXIL) 500 MG capsule Take 2 capsules (1,000 mg total) by mouth 2 (two) times daily., Starting Thu 08/07/2016, Print    benzonatate (TESSALON) 100 MG capsule Take 1 capsule (100 mg total) by mouth every 8 (eight) hours., Starting Thu 08/07/2016, Print       I personally performed the services described in this documentation, which was scribed in my presence. The recorded information has been reviewed and is accurate.     Melene Planan Bary Limbach, DO 08/07/16 2223

## 2016-08-07 NOTE — Discharge Instructions (Signed)
Take tylenol 2 pills 4 times a day and motrin 4 pills 3 times a day.  Drink plenty of fluids.  Return for worsening shortness of breath, headache, confusion. Follow up with your family doctor.   

## 2016-08-07 NOTE — ED Triage Notes (Signed)
Pt c/o fever for two days with cough, congestion, sore throat and body aches.  She took 400mg  ibuprofen at 2030 when she states her fever was 103.  Pt denies n/v, is able to tolerate fluids and stay hydrated.

## 2016-08-10 LAB — CULTURE, GROUP A STREP (THRC)

## 2016-09-28 ENCOUNTER — Encounter (HOSPITAL_BASED_OUTPATIENT_CLINIC_OR_DEPARTMENT_OTHER): Payer: Self-pay | Admitting: Emergency Medicine

## 2016-09-28 ENCOUNTER — Emergency Department (HOSPITAL_BASED_OUTPATIENT_CLINIC_OR_DEPARTMENT_OTHER)
Admission: EM | Admit: 2016-09-28 | Discharge: 2016-09-28 | Disposition: A | Discharge status: Home / Self Care | Payer: Medicaid Other | Attending: Emergency Medicine | Admitting: Emergency Medicine

## 2016-09-28 DIAGNOSIS — Z79899 Other long term (current) drug therapy: Secondary | ICD-10-CM | POA: Diagnosis not present

## 2016-09-28 DIAGNOSIS — G43909 Migraine, unspecified, not intractable, without status migrainosus: Secondary | ICD-10-CM | POA: Insufficient documentation

## 2016-09-28 DIAGNOSIS — R197 Diarrhea, unspecified: Secondary | ICD-10-CM | POA: Diagnosis not present

## 2016-09-28 MED ORDER — DIPHENHYDRAMINE HCL 50 MG/ML IJ SOLN
25.0000 mg | Freq: Once | INTRAMUSCULAR | Status: AC
  Administered 2016-09-28: 25 mg via INTRAVENOUS
  Filled 2016-09-28: qty 1

## 2016-09-28 MED ORDER — SODIUM CHLORIDE 0.9 % IV BOLUS (SEPSIS)
1000.0000 mL | Freq: Once | INTRAVENOUS | Status: AC
  Administered 2016-09-28: 1000 mL via INTRAVENOUS

## 2016-09-28 MED ORDER — METOCLOPRAMIDE HCL 5 MG/ML IJ SOLN
10.0000 mg | Freq: Once | INTRAMUSCULAR | Status: AC
  Administered 2016-09-28: 10 mg via INTRAVENOUS
  Filled 2016-09-28: qty 2

## 2016-09-28 NOTE — ED Triage Notes (Signed)
Pt reports migraine headache since waking this morning, similar to past migraines.  Pt no longer takes Rx medications for her migraines and took ibuprofen at approx 11am this morning with no relief.

## 2016-09-28 NOTE — ED Notes (Signed)
Pt discharged to home with family. NAD.  

## 2016-09-28 NOTE — ED Provider Notes (Addendum)
[facility]   CSN: [id number] Arrival date & time: 09/28/16  1545  By signing my name below, I, Alyssa Grove, attest that this documentation has been prepared under the direction and in the presence of Renne Crigler, PA-C. Electronically Signed: Alyssa Grove, ED Scribe. 09/28/16. 6:52 PM.   History   Chief Complaint Chief Complaint  Patient presents with  . Migraine   The history is provided by the patient. No language interpreter was used.   HPI Comments: Susan Mcpherson is a 41 y.o. female with PMHx of Migraines who presents to the Emergency Department complaining of a gradual onset, constant, migraine beginning last night. She states pain comes up from the back of her neck and radiates around her head. Pain is similar to past migraines, but she states they have never been this severe. Pt reports associated nausea, vomiting, diarrhea. She denies recent head injury or trauma. She was taken off Rx medications in 2017 because she was not having migraines. She reports allergies to codeine, cinnamon and latex.   Past Medical History:  Diagnosis Date  . Ectopic pregnancy   . Migraine   . Seizures (HCC)    not on any medications per MD, last seizure was sometime in 2017    There are no active problems to display for this patient.   Past Surgical History:  Procedure Laterality Date  . Gyn surgery    . Lumps in rt breast    . RHINOPLASTY      OB History    No data available       Home Medications    Prior to Admission medications   Medication Sig Start Date End Date Taking? Authorizing Provider  norethindrone-ethinyl estradiol-iron (ESTROSTEP FE,TILIA FE,TRI-LEGEST FE) 1-20/1-30/1-35 MG-MCG tablet Take 1 tablet by mouth daily.   Yes Historical Provider, MD  amoxicillin (AMOXIL) 500 MG capsule Take 2 capsules (1,000 mg total) by mouth 2 (two) times daily. 08/07/16   Melene Plan, DO  benzonatate (TESSALON) 100 MG capsule Take 1 capsule (100 mg total) by  mouth every 8 (eight) hours. 08/07/16   Melene Plan, DO    Family History Family History  Problem Relation Age of Onset  . Hypertension Father     Social History Social History  Substance Use Topics  . Smoking status: Never Smoker  . Smokeless tobacco: Never Used  . Alcohol use No     Allergies   Cinnamon; Codeine; and Latex   Review of Systems Review of Systems  Constitutional: Negative for fever.  HENT: Negative for congestion, dental problem, rhinorrhea and sinus pressure.   Eyes: Negative for photophobia, discharge, redness and visual disturbance.  Respiratory: Negative for shortness of breath.   Cardiovascular: Negative for chest pain.  Gastrointestinal: Positive for diarrhea, nausea and vomiting.  Musculoskeletal: Negative for gait problem, neck pain and neck stiffness.  Skin: Negative for rash.  Neurological: Positive for headaches. Negative for syncope, speech difficulty, weakness, light-headedness and numbness.  Psychiatric/Behavioral: Negative for confusion.   Physical Exam Updated Vital Signs BP (!) 172/102 (BP Location: Left Arm)   Pulse 67   Temp 98 F (36.7 C) (Oral)   Resp 18   Ht 5\' 1"  (1.549 m)   Wt 139 lb (63 kg)   LMP 08/28/2016   SpO2 98%   BMI 26.26 kg/m   Physical Exam  Constitutional: She is oriented to person, place, and time. She appears well-developed and well-nourished.  HENT:  Head: Normocephalic and atraumatic.  Right Ear:  Tympanic membrane, external ear and ear canal normal.  Left Ear: Tympanic membrane, external ear and ear canal normal.  Nose: Nose normal.  Mouth/Throat: Uvula is midline, oropharynx is clear and moist and mucous membranes are normal.  Eyes: Conjunctivae, EOM and lids are normal. Pupils are equal, round, and reactive to light. Right eye exhibits no nystagmus. Left eye exhibits no nystagmus.  Neck: Normal range of motion. Neck supple.  No meningismus.  Cardiovascular: Normal rate and regular rhythm.     Pulmonary/Chest: Effort normal and breath sounds normal.  Abdominal: Soft. There is no tenderness.  Musculoskeletal:       Cervical back: She exhibits normal range of motion, no tenderness and no bony tenderness.  Neurological: She is alert and oriented to person, place, and time. She has normal strength and normal reflexes. No cranial nerve deficit or sensory deficit. She displays a negative Romberg sign. Coordination and gait normal. GCS eye subscore is 4. GCS verbal subscore is 5. GCS motor subscore is 6.  Skin: Skin is warm and dry.  Psychiatric: She has a normal mood and affect.  Nursing note and vitals reviewed.    ED Treatments / Results  DIAGNOSTIC STUDIES: Oxygen Saturation is 98% on RA, normal by my interpretation.    COORDINATION OF CARE: 6:45 PM Discussed treatment plan with pt at bedside which includes IV Fluids and Reglan and Benadryl and pt agreed to plan.  Procedures Procedures (including critical care time)  Medications Ordered in ED Medications  sodium chloride 0.9 % bolus 1,000 mL (0 mLs Intravenous Stopped 09/28/16 2054)  metoCLOPramide (REGLAN) injection 10 mg (10 mg Intravenous Given 09/28/16 1920)  diphenhydrAMINE (BENADRYL) injection 25 mg (25 mg Intravenous Given 09/28/16 1917)      Initial Impression / Assessment and Plan / ED Course  I have reviewed the triage vital signs and the nursing notes.  Pertinent labs & imaging results that were available during my care of the patient were reviewed by me and considered in my medical decision making (see chart for details).      Vital signs reviewed and are as follows: Vitals:   09/28/16 1824 09/28/16 1928  BP: 146/89 162/93  Pulse: 72 64  Resp: 18 18  Temp: 98.2 F (36.8 C) 98.1 F (36.7 C)   On reexam, patient was complete resolution of headache symptoms. She states that she is ready to go. She will follow-up with her PCP as needed for management of her headaches.  Patient urged to return with  worsening symptoms or other concerns. Patient verbalized understanding and agrees with plan.    I personally performed the services described in this documentation, which was scribed in my presence. The recorded information has been reviewed and is accurate.   Final Clinical Impressions(s) / ED Diagnoses   Final diagnoses:  Migraine without status migrainosus, not intractable, unspecified migraine type   Patient without high-risk features of headache including: sudden onset/thunderclap HA, no similar headache in past, altered mental status, accompanying seizure, headache with exertion, age > 450, history of immunocompromise, neck or shoulder pain, fever, use of anticoagulation, family history of spontaneous SAH, concomitant drug use, toxic exposure.   Patient has a normal complete neurological exam, normal vital signs, normal level of consciousness, no signs of meningismus, is well-appearing/non-toxic appearing, no signs of trauma.   Imaging with CT/MRI not indicated given history and physical exam findings.   No dangerous or life-threatening conditions suspected or identified by history, physical exam, and by work-up. No indications for  hospitalization identified.    New Prescriptions New Prescriptions   No medications on file     Renne Crigler, Cordelia Poche 09/28/16 2253    Doug Sou, MD 09/28/16 2302    Renne Crigler, PA-C 10/06/16 1849    Doug Sou, MD 10/06/16 2326

## 2016-09-28 NOTE — ED Notes (Signed)
Pt asked what usually works best for her migraines and she stated that they usually "give her a shot".

## 2016-09-28 NOTE — Discharge Instructions (Signed)
Please read and follow all provided instructions.  Your diagnoses today include:  1. Migraine without status migrainosus, not intractable, unspecified migraine type     Tests performed today include:  Vital signs. See below for your results today.   Medications:  In the Emergency Department you received:  Reglan - antinausea/headache medication  Benadryl - antihistamine to counteract potential side effects of reglan  Take any prescribed medications only as directed.  Additional information:  Follow any educational materials contained in this packet.  You are having a headache. No specific cause was found today for your headache. It may have been a migraine or other cause of headache. Stress, anxiety, fatigue, and depression are common triggers for headaches.   Your headache today does not appear to be life-threatening or require hospitalization, but often the exact cause of headaches is not determined in the emergency department. Therefore, follow-up with your doctor is very important to find out what may have caused your headache and whether or not you need any further diagnostic testing or treatment.   Sometimes headaches can appear benign (not harmful), but then more serious symptoms can develop which should prompt an immediate re-evaluation by your doctor or the emergency department.  BE VERY CAREFUL not to take multiple medicines containing Tylenol (also called acetaminophen). Doing so can lead to an overdose which can damage your liver and cause liver failure and possibly death.   Follow-up instructions: Please follow-up with your primary care provider in the next 3 days for further evaluation of your symptoms.   Return instructions:   Please return to the Emergency Department if you experience worsening symptoms.  Return if the medications do not resolve your headache, if it recurs, or if you have multiple episodes of vomiting or cannot keep down fluids.  Return if you  have a change from the usual headache.  RETURN IMMEDIATELY IF you:  Develop a sudden, severe headache  Develop confusion or become poorly responsive or faint  Develop a fever above 100.62F or problem breathing  Have a change in speech, vision, swallowing, or understanding  Develop new weakness, numbness, tingling, incoordination in your arms or legs  Have a seizure  Please return if you have any other emergent concerns.  Additional Information:  Your vital signs today were: BP 162/93 (BP Location: Left Arm)    Pulse 64    Temp 98.1 F (36.7 C) (Oral)    Resp 18    Ht 5\' 1"  (1.549 m)    Wt 63 kg    LMP 08/28/2016    SpO2 100%    BMI 26.26 kg/m  If your blood pressure (BP) was elevated above 135/85 this visit, please have this repeated by your doctor within one month. --------------

## 2016-10-08 DIAGNOSIS — G43009 Migraine without aura, not intractable, without status migrainosus: Secondary | ICD-10-CM | POA: Insufficient documentation

## 2016-12-26 DIAGNOSIS — Z975 Presence of (intrauterine) contraceptive device: Secondary | ICD-10-CM | POA: Insufficient documentation

## 2018-01-13 ENCOUNTER — Emergency Department
Admission: EM | Admit: 2018-01-13 | Discharge: 2018-01-13 | Disposition: A | Discharge status: Home / Self Care | Payer: Medicaid Other | Source: Home / Self Care | Attending: Family Medicine | Admitting: Family Medicine

## 2018-01-13 ENCOUNTER — Emergency Department (INDEPENDENT_AMBULATORY_CARE_PROVIDER_SITE_OTHER): Payer: Medicaid Other

## 2018-01-13 ENCOUNTER — Encounter: Payer: Self-pay | Admitting: *Deleted

## 2018-01-13 ENCOUNTER — Other Ambulatory Visit: Payer: Self-pay

## 2018-01-13 DIAGNOSIS — S4991XA Unspecified injury of right shoulder and upper arm, initial encounter: Secondary | ICD-10-CM | POA: Diagnosis not present

## 2018-01-13 DIAGNOSIS — M19011 Primary osteoarthritis, right shoulder: Secondary | ICD-10-CM | POA: Diagnosis not present

## 2018-01-13 MED ORDER — HYDROCODONE-ACETAMINOPHEN 5-325 MG PO TABS: 1.0000 | ORAL_TABLET | Freq: Four times a day (QID) | ORAL | 0 refills | Status: DC | PRN

## 2018-01-13 MED ORDER — CYCLOBENZAPRINE HCL 10 MG PO TABS: 10.0000 mg | ORAL_TABLET | Freq: Two times a day (BID) | ORAL | 0 refills | Status: DC | PRN

## 2018-01-13 NOTE — ED Triage Notes (Signed)
Pt c/o LT shoulder pain after dead lifting 2 wks ago and the pain became worse today at 1100 while bench pressing. She took IBF  at 1310, applied ice and KT tape.

## 2018-01-13 NOTE — ED Provider Notes (Signed)
[facility]    CSN: [id number] Arrival date & time: 01/13/18  1614     History   Chief Complaint Chief Complaint  Patient presents with  . Shoulder Pain    HPI Susan Mcpherson is a 42 y.o. female.   HPI Susan Mcpherson is a 42 y.o. female presenting to UC with c/o Right shoulder pain.  She initially injured it about 2 weeks ago doing a deadlift.  Pain became worse today after doing a bench press around 11AM.  She took  ibuprofen at 1310, applied ice and KT tape with minimal relief.  She is unable to move her arm without pain. Pain starts in her trapezius muscle and radiates all the way down her Right arm.  She is concerned she injured her rotator cuff. No prior hx of rotator cuff tear or surgery. Pain is severe with limited movement.  She is Right hand dominant.  She is a Control and instrumentation engineer.   Past Medical History:  Diagnosis Date  . Ectopic pregnancy   . Migraine   . Seizures (HCC)    not on any medications per MD, last seizure was sometime in 2017    There are no active problems to display for this patient.   Past Surgical History:  Procedure Laterality Date  . Gyn surgery    . Lumps in rt breast    . RHINOPLASTY      OB History   None      Home Medications    Prior to Admission medications   Medication Sig Start Date End Date Taking? Authorizing Provider  Galcanezumab-gnlm [facility]) Inject into the skin.   Yes [provider]  cyclobenzaprine (FLEXERIL) 10 MG tablet Take 1 tablet (10 mg total) by mouth 2 (two) times daily as needed. 01/13/18   Lurene Shadow, PA-C  HYDROcodone-acetaminophen (NORCO/VICODIN) 5-325 MG tablet Take 1-2 tablets by mouth every 6 (six) hours as needed. 01/13/18   Lurene Shadow, PA-C  norethindrone-ethinyl estradiol-iron (ESTROSTEP FE,TILIA FE,TRI-LEGEST FE) 1-20/1-30/1-35 MG-MCG tablet Take 1 tablet by mouth daily.    [provider]    Family History Family History  Problem Relation Age  of Onset  . Hypertension Father     Social History Social History   Tobacco Use  . Smoking status: Never Smoker  . Smokeless tobacco: Never Used  Substance Use Topics  . Alcohol use: No  . Drug use: No     Allergies   Cinnamon; Codeine; and Latex   Review of Systems Review of Systems  Musculoskeletal: Positive for arthralgias and myalgias.  Skin: Negative for color change and wound.  Neurological: Positive for weakness and numbness (Right middle finger).     Physical Exam Triage Vital Signs ED Triage Vitals [01/13/18 1706]  Enc Vitals Group     BP (!) 154/97     Pulse Rate 61     Resp 16     Temp 98.3 F (36.8 C)     Temp Source Oral     SpO2 100 %     Weight 134 lb (60.8 kg)     Height  (1.549 m)     Head Circumference      Peak Flow      Pain Score 8     Pain Loc      Pain Edu?      Excl. in GC?    No data found.  Updated Vital Signs BP (!) 154/97 (BP Location: Left Arm)  Pulse 61   Temp 98.3 F (36.8 C) (Oral)   Resp 16   Ht  (1.549 m)   Wt 134 lb (60.8 kg)   LMP 12/14/2017   SpO2 100%   BMI 25.32 kg/m   Visual Acuity Right Eye Distance:   Left Eye Distance:   Bilateral Distance:    Right Eye Near:   Left Eye Near:    Bilateral Near:     Physical Exam  Constitutional: She is oriented to person, place, and time. She appears well-developed and well-nourished. No distress.  HENT:  Head: Normocephalic and atraumatic.  Eyes: EOM are normal.  Neck: Normal range of motion.  Cardiovascular: Normal rate.  Pulses:      Radial pulses are 2+ on the right side.  Pulmonary/Chest: Effort normal.  Musculoskeletal: She exhibits tenderness.  No midline spinal tenderness. Tenderness to Right trapezius, Right shoulder, upper arm and forearm.  Extremely limited ROM due to pain. Unable to fully touch Left shoulder with her Right hand. Pain with full flexion and extension at the elbow. No bony tenderness in the elbow wrist or hand 4/5 grip  strength compared to Left.  Neurological: She is alert and oriented to person, place, and time.  Skin: Skin is warm and dry. Capillary refill takes less than 2 seconds. She is not diaphoretic.  Psychiatric: She has a normal mood and affect. Her behavior is normal.  Nursing note and vitals reviewed.    UC Treatments / Results  Labs (all labs ordered are listed, but only abnormal results are displayed) Labs Reviewed - No data to display  EKG None  Radiology Dg Shoulder Right  Result Date: 01/13/2018 CLINICAL DATA:  Weightlifting injury to right shoulder. EXAM: RIGHT SHOULDER - 2+ VIEW COMPARISON:  None. FINDINGS: There is no evidence of fracture or dislocation. 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 No bony lesions or destruction. Soft tissues are unremarkable. IMPRESSION: No acute findings. 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 Electronically Signed   By: Irish Lack M.D.   On: 01/13/2018 17:45    Procedures Procedures (including critical care time)  Medications Ordered in UC Medications - No data to display  Initial Impression / Assessment and Plan / UC Course  I have reviewed the triage vital signs and the nursing notes.  Pertinent labs & imaging results that were available during my care of the patient were reviewed by me and considered in my medical decision making (see chart for details).     No shoulder dislocation Will tx as likely RCT Pt placed in shoulder immobilizer Home care instructions provided below.    Final Clinical Impressions(s) / UC Diagnoses   Final diagnoses:  Right shoulder injury, initial encounter     Discharge Instructions      Norco/Vicodin (hydrocodone-acetaminophen) is a narcotic pain medication, do not combine these medications with others containing tylenol. While taking, do not drink alcohol, drive, or perform any other activities that requires focus while taking these medications.    Flexeril (cyclobenzaprine) is a muscle relaxer and may cause drowsiness. Do not drink alcohol, drive, or operate heavy machinery while taking.  You may take 400-800mg  ibuprofen every 6-8 hours for pain and inflammation.  Please call to schedule an appointment with either Sports Medicine or an Orthopedist for further evaluation and treatment of your Right shoulder injury.       ED Prescriptions    Medication Sig Dispense Auth. Provider   cyclobenzaprine (FLEXERIL)  10 MG tablet Take 1 tablet (10 mg total) by mouth 2 (two) times daily as needed. 20 tablet Lurene Shadow, [place]   HYDROcodone-acetaminophen (NORCO/VICODIN) 5-325 MG tablet Take 1-2 tablets by mouth every 6 (six) hours as needed. 15 tablet Lurene Shadow, PA-C     Controlled Substance Prescriptions [place] Controlled Substance Registry consulted? Yes, I have consulted the [place] Controlled Substances Registry for this patient, and feel the risk/benefit ratio today is favorable for proceeding with this prescription for a controlled substance.   Lurene Shadow, [place] 01/13/18 1930

## 2018-01-13 NOTE — Discharge Instructions (Signed)
°  Norco/Vicodin (hydrocodone-acetaminophen) is a narcotic pain medication, do not combine these medications with others containing tylenol. While taking, do not drink alcohol, drive, or perform any other activities that requires focus while taking these medications.   Flexeril (cyclobenzaprine) is a muscle relaxer and may cause drowsiness. Do not drink alcohol, drive, or operate heavy machinery while taking.  You may take 400-800mg  ibuprofen every 6-8 hours for pain and inflammation.  Please call to schedule an appointment with either Sports Medicine or an Orthopedist for further evaluation and treatment of your Right shoulder injury.

## 2018-01-14 ENCOUNTER — Ambulatory Visit (INDEPENDENT_AMBULATORY_CARE_PROVIDER_SITE_OTHER): Payer: Medicaid Other | Admitting: Family Medicine

## 2018-01-14 ENCOUNTER — Encounter: Payer: Self-pay | Admitting: Family Medicine

## 2018-01-14 VITALS — BP 142/82 | HR 64 | Ht 61.0 in | Wt 135.0 lb

## 2018-01-14 DIAGNOSIS — M25511 Pain in right shoulder: Secondary | ICD-10-CM

## 2018-01-14 MED ORDER — NAPROXEN 500 MG PO TABS: 500.0000 mg | ORAL_TABLET | Freq: Two times a day (BID) | ORAL | 0 refills | Status: DC

## 2018-01-14 MED ORDER — DICLOFENAC SODIUM 1 % TD GEL: 4.0000 g | Freq: Four times a day (QID) | TRANSDERMAL | 11 refills | Status: DC

## 2018-01-14 NOTE — Patient Instructions (Addendum)
Thank you for coming in today. Apply ice 20 mins or so 1-2x daily.  Work on shoulder range of motion exercises.  Attend PT in 1-2 weeks.  Apply the diclofenac gel 4x daily for pain as needed.  Take naproxen 2x daily for pain.   Recheck with me in 1-2 weeks.

## 2018-01-15 ENCOUNTER — Encounter: Payer: Self-pay | Admitting: Family Medicine

## 2018-01-15 NOTE — Progress Notes (Signed)
Subjective:    I'm seeing this patient as a consultation for:  Waylan Rocher PA-C  CC: Right shoulder injury  HPI: Susan Mcpherson is a healthy active 42 year old woman who participates in competitive weightlifting.  She was in her normal state of health about 2 weeks ago.  She was doing a dead lift and felt some pain in her shoulder then proceeded to benchpress with maximal exertion.  She notes during her final left she felt a pain in her pulling sensation in her right shoulder.  She was able to continue working out that day but notes shortly thereafter significant pain in her anterior shoulder and into her trapezius.  She notes very minimal occasional symptoms radiating down to her  second and third digits of her right hand.  She was seen in urgent care on May 22 where she was provided with shoulder immobilizer which is helped.  X-rays on May 22 in urgent care showed mild degenerative disease at the [facility] joint but otherwise were unremarkable.  She is right-hand dominant and notes this is extremely obnoxious interfering with her quality of life.  She notes the pain is quite severe.  She is tried NSAIDs which have helped a bit.  Past medical history, Surgical history, Family history not pertinant except as noted below, Social history, Allergies, and medications have been entered into the medical record, reviewed, and no changes needed.   Review of Systems: No headache, visual changes, nausea, vomiting, diarrhea, constipation, dizziness, abdominal pain, skin rash, fevers, chills, night sweats, weight loss, swollen lymph nodes, body aches, joint swelling, muscle aches, chest pain, shortness of breath, mood changes, visual or auditory hallucinations.   Objective:    Vitals:   01/14/18 1601  BP: (!) 142/82  Pulse: 64   General: Well Developed, well nourished, and in no acute distress.  Neuro/Psych: Alert and oriented x3, extra-ocular muscles intact, able to move all 4 extremities, sensation grossly  intact. Skin: Warm and dry, no rashes noted.  Respiratory: Not using accessory muscles, speaking in full sentences, trachea midline.  Cardiovascular: Pulses palpable, no extremity edema. Abdomen: Does not appear distended. MSK:  Right shoulder: Elevation of the entire shoulder girdle right compared to left.  Otherwise normal-appearing with no erythema or deformities. Tender to palpation at the anterior aspect of the shoulder Range of motion externally limited due to guarding. Pulses capillary refill and sensation are intact distally.  Hand motion is intact distally.  Limited musculoskeletal ultrasound the right shoulder AC joint effusion Fluid tracking along the biceps tendon sheath however the biceps tendon appears to be intact. Supraspinatus and infraspinatus tendons appear to be intact. Subscapularis tendon difficult to visualize as patient has significant pain with external rotation of the shoulder over with the neutral position in the distal portion of the subscapularis tendon is intact appearing without obvious severe tear.    No results found for this or any previous visit (from the past 24 hour(s)). Dg Shoulder Right  Result Date: 01/13/2018 CLINICAL DATA:  Weightlifting injury to right shoulder. EXAM: RIGHT SHOULDER - 2+ VIEW COMPARISON:  None. FINDINGS: There is no evidence of fracture or dislocation. 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 No bony lesions or destruction. Soft tissues are unremarkable. IMPRESSION: No acute findings. 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 Electronically Signed   By: Irish Lack M.D.   On: 01/13/2018 17:45    Impression and Recommendations:    Assessment and Plan: 42 y.o. female with right  shoulder pain.  Patient very likely suffered strain or injury of the superior pectoralis, subscapularis muscle and now trapezius musculature.  She is significantly guarding today with exam.  Plan to proceed with trial  of relative rest and NSAIDs.  Diclofenac gel and oral naproxen and limited Norco for pain.  Continue shoulder immobilizer as needed but start working on passive shoulder range of motion exercises.  Plan to start physical therapy when able.  Recheck in 1 to 2 weeks.  If not improving would proceed with MRI to evaluate for the potential for rotator cuff tear not visualized well on ultrasound due to positioning and guarding.   Orders Placed This Encounter  Procedures  . Ambulatory referral to Physical Therapy    Referral Priority:   Routine    Referral Type:   Physical Medicine    Referral Reason:   Specialty Services Required    Requested Specialty:   Physical Therapy   Meds ordered this encounter  Medications  . diclofenac sodium (VOLTAREN) 1 % GEL    Sig: Apply 4 g topically 4 (four) times daily. To affected joint.    Dispense:  100 g    Refill:  11  . naproxen (NAPROSYN) 500 MG tablet    Sig: Take 1 tablet (500 mg total) by mouth 2 (two) times daily with a meal.    Dispense:  30 tablet    Refill:  0    Discussed warning signs or symptoms. Please see discharge instructions. Patient expresses understanding.

## 2018-01-20 ENCOUNTER — Ambulatory Visit (INDEPENDENT_AMBULATORY_CARE_PROVIDER_SITE_OTHER): Payer: Medicaid Other | Admitting: Family Medicine

## 2018-01-20 ENCOUNTER — Ambulatory Visit: Payer: Medicaid Other | Admitting: Physical Therapy

## 2018-01-20 ENCOUNTER — Encounter: Payer: Self-pay | Admitting: Family Medicine

## 2018-01-20 VITALS — BP 158/106 | HR 62 | Wt 135.0 lb

## 2018-01-20 DIAGNOSIS — M25511 Pain in right shoulder: Secondary | ICD-10-CM

## 2018-01-20 NOTE — Patient Instructions (Signed)
Thank you for coming in today. Resume exercise.  Gradually increase your shoulder demands.  Continue shoulder band and scapula stabilizing activity.  Recheck with me in 6 weeks.  If clicking or instability gets worse or intolerable let me know and I will order an MRI arthrogram.

## 2018-01-20 NOTE — Progress Notes (Signed)
Susan Mcpherson is a 42 y.o. female who presents to [facility] today for right shoulder pain.  Susan Mcpherson was seen on May 23 for right shoulder pain.  At that time she had severe pain and was guarding.  She had a trial of relative rest and notes that she has had significant improvement in pain.  She is able to move her shoulder almost completely normally without a lot of pain.  She is interested in resuming exercises if able.  She does note some clicking with shoulder abduction motion.  She denies instability.    ROS:  As above  Exam:  BP (!) 158/106   Pulse 62   Wt 135 lb (61.2 kg)   BMI 25.51 kg/m  General: Well Developed, well nourished, and in no acute distress.  Neuro/Psych: Alert and oriented x3, extra-ocular muscles intact, able to move all 4 extremities, sensation grossly intact. Skin: Warm and dry, no rashes noted.  Respiratory: Not using accessory muscles, speaking in full sentences, trachea midline.  Cardiovascular: Pulses palpable, no extremity edema. Abdomen: Does not appear distended. MSK:  Right shoulder normal-appearing with no swelling or ecchymosis. Shoulder range of motion is normal. Palpable clicking present with abduction. Negative Hawkins and Neer's test. Negative empty can test. Mildly positive O'Brien's test. Negative clunk test. Negative sulcus sign. Negative anterior or posterior apprehension test. Strength is intact.   Lab and Radiology Results  Limited musculoskeletal ultrasound right shoulder Mild AC joint effusion Biceps tendon intact and groove. Scapularis tendon appears to be intact Supraspinatus tendon is intact.  There appears to be a slight area of hypoechoic change at the distal bursal side with slight increased Doppler activity.  No large tear seen. Infraspinatus normal-appearing Normal bony structures. Impression: Possible distance partial supraspinatus tear  Assessment and Plan: 42 y.o.  female with  Shoulder pain significant improvement.  Patient is doing much better than expected at this point.  She does have some clicking and some mild pain but overall much improved.  Differential includes partial supraspinatus tear versus labral injury versus strain.  Plan for continued home exercise program as guided by pain.  If not improving or if worsening next step would be MRI arthrogram.  Graduated return to weight lifting.  Recheck in 6 weeks.    No orders of the defined types were placed in this encounter.  No orders of the defined types were placed in this encounter.   Historical information moved to improve visibility of documentation.  Past Medical History:  Diagnosis Date  . Ectopic pregnancy   . Migraine   . Seizures (HCC)    not on any medications per MD, last seizure was sometime in 2017   Past Surgical History:  Procedure Laterality Date  . Gyn surgery    . Lumps in rt breast    . RHINOPLASTY     Social History   Tobacco Use  . Smoking status: Never Smoker  . Smokeless tobacco: Never Used  Substance Use Topics  . Alcohol use: No   family history includes Hypertension in her father.  Medications: Current Outpatient Medications  Medication Sig Dispense Refill  . COPPER PO by Intrauterine route.    . cyclobenzaprine (FLEXERIL) 10 MG tablet Take 1 tablet (10 mg total) by mouth 2 (two) times daily as needed. 20 tablet 0  . diclofenac sodium (VOLTAREN) 1 % GEL Apply 4 g topically 4 (four) times daily. To affected joint. 100 g 11  . frovatriptan (FROVA) 2.5  MG tablet Take by mouth.    Marland Kitchen Galcanezumab-gnlm (EMGALITY [place]) Inject into the skin.    Marland Kitchen HYDROcodone-acetaminophen (NORCO/VICODIN) 5-325 MG tablet Take 1-2 tablets by mouth every 6 (six) hours as needed. 15 tablet 0  . naproxen (NAPROSYN) 500 MG tablet Take 1 tablet (500 mg total) by mouth 2 (two) times daily with a meal. 30 tablet 0  . promethazine (PHENERGAN) 25 MG tablet TK 1 T PO Q 6 H PRF UP TO 7 DAYS  N  0  . topiramate (TOPAMAX) 25 MG tablet TK 2 TS PO QHS  11   No current facility-administered medications for this visit.    Allergies  Allergen Reactions  . Cinnamon   . Codeine   . Latex       Discussed warning signs or symptoms. Please see discharge instructions. Patient expresses understanding.  I spent 15 minutes with this patient, greater than 50% was face-to-face time counseling regarding ddx and treatment plan.

## 2018-01-28 ENCOUNTER — Ambulatory Visit: Payer: Medicaid Other | Admitting: Family Medicine

## 2018-03-15 ENCOUNTER — Other Ambulatory Visit: Payer: Self-pay

## 2018-03-15 ENCOUNTER — Emergency Department (HOSPITAL_BASED_OUTPATIENT_CLINIC_OR_DEPARTMENT_OTHER)
Admission: EM | Admit: 2018-03-15 | Discharge: 2018-03-15 | Disposition: A | Discharge status: Home / Self Care | Payer: Medicaid Other | Attending: Emergency Medicine | Admitting: Emergency Medicine

## 2018-03-15 ENCOUNTER — Encounter (HOSPITAL_BASED_OUTPATIENT_CLINIC_OR_DEPARTMENT_OTHER): Payer: Self-pay

## 2018-03-15 DIAGNOSIS — R0989 Other specified symptoms and signs involving the circulatory and respiratory systems: Secondary | ICD-10-CM

## 2018-03-15 DIAGNOSIS — J029 Acute pharyngitis, unspecified: Secondary | ICD-10-CM | POA: Insufficient documentation

## 2018-03-15 DIAGNOSIS — G43909 Migraine, unspecified, not intractable, without status migrainosus: Secondary | ICD-10-CM | POA: Diagnosis not present

## 2018-03-15 DIAGNOSIS — R51 Headache: Secondary | ICD-10-CM | POA: Diagnosis present

## 2018-03-15 DIAGNOSIS — Z91040 Latex allergy status: Secondary | ICD-10-CM | POA: Insufficient documentation

## 2018-03-15 DIAGNOSIS — R6889 Other general symptoms and signs: Secondary | ICD-10-CM

## 2018-03-15 MED ORDER — METOCLOPRAMIDE HCL 5 MG/ML IJ SOLN
10.0000 mg | Freq: Once | INTRAMUSCULAR | Status: AC
  Administered 2018-03-15: 10 mg via INTRAVENOUS
  Filled 2018-03-15: qty 2

## 2018-03-15 MED ORDER — SODIUM CHLORIDE 0.9 % IV BOLUS
1000.0000 mL | Freq: Once | INTRAVENOUS | Status: AC
Start: 2018-03-15 — End: 2018-03-15
  Administered 2018-03-15: 1000 mL via INTRAVENOUS

## 2018-03-15 MED ORDER — DEXAMETHASONE SODIUM PHOSPHATE 10 MG/ML IJ SOLN
10.0000 mg | Freq: Once | INTRAMUSCULAR | Status: AC
  Administered 2018-03-15: 10 mg via INTRAVENOUS
  Filled 2018-03-15: qty 1

## 2018-03-15 MED ORDER — PREDNISONE 20 MG PO TABS: 40.0000 mg | ORAL_TABLET | Freq: Every day | ORAL | 0 refills | Status: AC

## 2018-03-15 MED ORDER — KETOROLAC TROMETHAMINE 30 MG/ML IJ SOLN
30.0000 mg | Freq: Once | INTRAMUSCULAR | Status: AC
  Administered 2018-03-15: 30 mg via INTRAVENOUS
  Filled 2018-03-15: qty 1

## 2018-03-15 MED ORDER — EPINEPHRINE 0.3 MG/0.3ML IJ SOAJ: 0.3000 mg | Freq: Once | INTRAMUSCULAR | 0 refills | Status: AC

## 2018-03-15 MED ORDER — DIPHENHYDRAMINE HCL 50 MG/ML IJ SOLN
50.0000 mg | Freq: Once | INTRAMUSCULAR | Status: AC
  Administered 2018-03-15: 50 mg via INTRAVENOUS
  Filled 2018-03-15: qty 1

## 2018-03-15 NOTE — Discharge Instructions (Signed)

## 2018-03-15 NOTE — ED Triage Notes (Signed)
Pt presents by [facility] w/ migrane. Pt self administered Imitrex to her right thigh. Pt complains of nausea denies vomiting.

## 2018-03-15 NOTE — ED Provider Notes (Signed)
Emergency Department Provider Note   I have reviewed the triage vital signs and the nursing notes.   HISTORY  Chief Complaint No chief complaint on file.   HPI Susan Mcpherson is a 42 y.o. female with PMH of migraine HA and seizure presents to the emergency department for evaluation of migraine headache and certain for an allergic reaction after administering Imitrex.  The patient has an injectable Imitrex at home and after developing a severe, typical migraine headache for her, today she administered the Imitrex.  She states almost immediately afterwards she began having sore throat and perception of tightness with some difficulty breathing.  She denies any itchy rash or abdominal pain.  She did have some nausea and lightheadedness.  EMS was called and transported the patient.  She is currently complaining of posterior headache and photophobia.  She continues to have some sore throat but no difficulty breathing at this time.  Past Medical History:  Diagnosis Date  . Ectopic pregnancy   . Migraine   . Seizures (HCC)    not on any medications per MD, last seizure was sometime in 2017    Patient Active Problem List   Diagnosis Date Noted  . IUD (intrauterine device) in place 12/26/2016  . Migraine without aura and without status migrainosus, not intractable 10/08/2016  . Elevated BP without diagnosis of hypertension 09/27/2015  . Protein S deficiency (HCC) 07/13/2013  . 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 Gyn surgery    . Lumps in rt breast    . RHINOPLASTY      Allergies Cinnamon; Codeine; and Latex  Family History  Problem Relation Age of Onset  . Hypertension Father     Social History Social History   Tobacco Use  . Smoking status: Never Smoker  . Smokeless tobacco: Never Used  Substance Use Topics  . Alcohol use: No  . Drug use: No    Review of Systems  Constitutional: No fever/chills Eyes: No visual  changes. ENT: Positive sore throat. Cardiovascular: Denies chest pain. Respiratory: Denies shortness of breath. Gastrointestinal: No abdominal pain.  No nausea, no vomiting.  No diarrhea.  No constipation. Genitourinary: Negative for dysuria. Musculoskeletal: Negative for back pain. Skin: Negative for rash. Neurological: Negative for focal weakness or numbness. Positive HA.   10-point ROS otherwise negative.  ____________________________________________   PHYSICAL EXAM:  VITAL SIGNS: ED Triage Vitals  Enc Vitals Group     BP 03/15/18 1653 (!) 164/101     Pulse Rate 03/15/18 1653 63     Resp 03/15/18 1653 19     Temp 03/15/18 1653 98.7 F (37.1 C)     Temp Source 03/15/18 1653 Oral     SpO2 03/15/18 1653 99 %     Weight 03/15/18 1652 134 lb (60.8 kg)     Height 03/15/18 1652 5\' 2"  (1.575 m)     Pain Score 03/15/18 1651 10    Constitutional: Alert and oriented. Tearful and sitting in a dark room.  Eyes: Conjunctivae are normal. Positive photophobia.  Head: Atraumatic. Nose: No congestion/rhinnorhea. Mouth/Throat: Mucous membranes are moist.  Oropharynx non-erythematous. Neck: No stridor.  Cardiovascular: Normal rate, regular rhythm. Good peripheral circulation. Grossly normal heart sounds.   Respiratory: Normal respiratory effort.  No retractions. Lungs CTAB. Gastrointestinal: Soft and nontender. No distention.  Musculoskeletal: No lower extremity tenderness nor edema. No gross deformities of extremities. Neurologic:  Normal speech and language. No gross focal neurologic deficits  are appreciated.  Skin:  Skin is warm, dry and intact. No rash noted.  ____________________________________________  RADIOLOGY  None ____________________________________________   PROCEDURES  Procedure(s) performed:   Procedures  None ____________________________________________   INITIAL IMPRESSION / ASSESSMENT AND PLAN / ED COURSE  Pertinent labs & imaging results that were  available during my care of the patient were reviewed by me and considered in my medical decision making (see chart for details).  Patient presents to the emergency department for evaluation of migraine headache complicated by sore throat after administering Imitrex.  No signs of anaphylaxis on my exam.  She is complaining of sore throat but the posterior pharynx is normal.  She is speaking in a clear voice and managing her oral secretions.  No concern for deep space neck infection.  Patient is feeling much better after meds. No more sore throat or HA. Plan for discharge with steroid burst and PCP follow up plan.   At this time, I do not feel there is any life-threatening condition present. I have reviewed and discussed all results (EKG, imaging, lab, urine as appropriate), exam findings with patient. I have reviewed nursing notes and appropriate previous records.  I feel the patient is safe to be discharged home without further emergent workup. Discussed usual and customary return precautions. Patient and family (if present) verbalize understanding and are comfortable with this plan.  Patient will follow-up with their primary care provider. If they do not have a primary care provider, information for follow-up has been provided to them. All questions have been answered.  ____________________________________________  FINAL CLINICAL IMPRESSION(S) / ED DIAGNOSES  Final diagnoses:  Migraine without status migrainosus, not intractable, unspecified migraine type  Throat tightness     MEDICATIONS GIVEN DURING THIS VISIT:  Medications  sodium chloride 0.9 % bolus 1,000 mL (0 mLs Intravenous Stopped 03/15/18 1837)  ketorolac (TORADOL) 30 MG/ML injection 30 mg (30 mg Intravenous Given 03/15/18 1711)  metoCLOPramide (REGLAN) injection 10 mg (10 mg Intravenous Given 03/15/18 1711)  diphenhydrAMINE (BENADRYL) injection 50 mg (50 mg Intravenous Given 03/15/18 1710)  dexamethasone (DECADRON) injection 10 mg  (10 mg Intravenous Given 03/15/18 1711)     NEW OUTPATIENT MEDICATIONS STARTED DURING THIS VISIT:  Discharge Medication List as of 03/15/2018  6:55 PM    START taking these medications   Details  EPINEPHrine (EPIPEN 2-PAK) 0.3 mg/0.3 mL IJ SOAJ injection Inject 0.3 mLs (0.3 mg total) into the muscle once for 1 dose., Starting Mon 03/15/2018, Print    predniSONE (DELTASONE) 20 MG tablet Take 2 tablets (40 mg total) by mouth daily for 4 days., Starting Tue 03/16/2018, Until Sat 03/20/2018, Print        Note:  This document was prepared using Dragon voice recognition software and may include unintentional dictation errors.  Alona Bene, MD Emergency Medicine    Payden Docter, Arlyss Repress, MD 03/16/18 [id number]

## 2018-03-23 ENCOUNTER — Encounter: Payer: Self-pay | Admitting: Family Medicine

## 2018-03-23 ENCOUNTER — Ambulatory Visit (INDEPENDENT_AMBULATORY_CARE_PROVIDER_SITE_OTHER): Payer: Medicaid Other | Admitting: Family Medicine

## 2018-03-23 VITALS — BP 146/88 | HR 75 | Wt 136.0 lb

## 2018-03-23 DIAGNOSIS — M25511 Pain in right shoulder: Secondary | ICD-10-CM | POA: Diagnosis not present

## 2018-03-23 NOTE — Patient Instructions (Signed)
Thank you for coming in today. You will hear from us about MRI scheduling.  Follow up with me or Dr T prior to the MRI for the injection before the MRI.

## 2018-03-23 NOTE — Progress Notes (Signed)
Susan ScheuermannLeah B Mcpherson is a 42 y.o. female who presents to [facility] today for right shoulder pain.  Susan RuizLeah has been seen previously for right shoulder pain following an injury.  At the time there was concern for rotator cuff tear versus labral tear versus rotator cuff strain and tendinitis.  She had a trial of conservative management and improved.  However she continues to experience persistent shoulder pain especially with abduction and external rotation.  She is unable to lift weights normally without clicking popping and pain.  This is very frustrating as weight lifting is very important for her.  She is done extensive home exercise program and notes that she has not had much improvement.  No radiating pain weakness or numbness fevers or chills.    ROS:  As above  Exam:  BP (!) 146/88   Pulse 75   Wt 136 lb (61.7 kg)   LMP 03/15/2018   BMI 24.87 kg/m  General: Well Developed, well nourished, and in no acute distress.  Neuro/Psych: Alert and oriented x3, extra-ocular muscles intact, able to move all 4 extremities, sensation grossly intact. Skin: Warm and dry, no rashes noted.  Respiratory: Not using accessory muscles, speaking in full sentences, trachea midline.  Cardiovascular: Pulses palpable, no extremity edema. Abdomen: Does not appear distended. MSK:  Well-developed musculature and trapezius is bilaterally.  Right shoulder normal-appearing no deformity. Normal range of motion however significant click and pain with abduction. Strength is intact over pain with abduction and external rotation and internal rotation. Positive O'Brien's test positive click and clunk test. Positive impingement testing. Pulses capillary fill and sensation are intact.  Left shoulder normal-appearing nontender normal motion normal strength negative impingement testing.    Lab and Radiology Results CLINICAL DATA:  Weightlifting injury to right  shoulder.  EXAM: RIGHT SHOULDER - 2+ VIEW  COMPARISON:  None.  FINDINGS: There is no evidence of fracture or dislocation. Mild degenerative disease of the [facility] joint and humeral head. No bony lesions or destruction. Soft tissues are unremarkable.  IMPRESSION: No acute findings. Mild degenerative disease of the [facility] joint and humeral head.   Electronically Signed   By: Irish LackGlenn  Yamagata M.D.   On: 01/13/2018 17:45  I personally (independently) visualized and performed the interpretation of the images attached in this note.   Assessment and Plan: 42 y.o. female with  Persistent right shoulder pain with mechanical symptoms.  This occurred following an injury and is interfering with her quality of life including weight lifting.  Patient is failing conservative management.  Plan for MRI arthrogram for injection or surgical planning.  I spent 25 minutes with this patient, greater than 50% was face-to-face time counseling regarding ddx and plan.   Orders Placed This Encounter  Procedures  . MR SHOULDER RIGHT W CONTRAST    MRI arthrogram    Standing Status:   Future    Standing Expiration Date:   05/25/2019    Order Specific Question:   ** REASON FOR EXAM (FREE TEXT)    Answer:   suspect labrum tear right shoulder Plan MRI arthrtogram    Order Specific Question:   If indicated for the ordered procedure, I authorize the administration of contrast media per Radiology protocol    Answer:   Yes    Order Specific Question:   What is the patient's sedation requirement?    Answer:   No Sedation    Order Specific Question:   Does the patient have a pacemaker or  implanted devices?    Answer:   No    Order Specific Question:   Radiology Contrast Protocol - do NOT remove file path    Answer:   [email or web address]    Order Specific Question:   Preferred imaging location?    Answer:   [facility] (table limit-350lbs)   No orders of the defined types  were placed in this encounter.   Historical information moved to improve visibility of documentation.  Past Medical History:  Diagnosis Date  . Ectopic pregnancy   . Migraine   . Seizures (HCC)    not on any medications per MD, last seizure was sometime in 2017   Past Surgical History:  Procedure Laterality Date  . Gyn surgery    . Lumps in rt breast    . RHINOPLASTY     Social History   Tobacco Use  . Smoking status: Never Smoker  . Smokeless tobacco: Never Used  Substance Use Topics  . Alcohol use: No   family history includes Hypertension in her father.  Medications: Current Outpatient Medications  Medication Sig Dispense Refill  . COPPER PO by Intrauterine route.    . cyclobenzaprine (FLEXERIL) 10 MG tablet Take 1 tablet (10 mg total) by mouth 2 (two) times daily as needed. 20 tablet 0  . diclofenac sodium (VOLTAREN) 1 % GEL Apply 4 g topically 4 (four) times daily. To affected joint. 100 g 11  . frovatriptan (FROVA) 2.5 MG tablet Take by mouth.    Marland Kitchen Galcanezumab-gnlm (EMGALITY [place]) Inject into the skin.    . naproxen (NAPROSYN) 500 MG tablet Take 1 tablet (500 mg total) by mouth 2 (two) times daily with a meal. 30 tablet 0  . promethazine (PHENERGAN) 25 MG tablet TK 1 T PO Q 6 H PRF UP TO 7 DAYS N  0  . topiramate (TOPAMAX) 25 MG tablet TK 2 TS PO QHS  11   No current facility-administered medications for this visit.    Allergies  Allergen Reactions  . Cinnamon   . Codeine   . Latex       Discussed warning signs or symptoms. Please see discharge instructions. Patient expresses understanding.

## 2018-03-29 ENCOUNTER — Ambulatory Visit (INDEPENDENT_AMBULATORY_CARE_PROVIDER_SITE_OTHER): Payer: Medicaid Other

## 2018-03-29 ENCOUNTER — Encounter: Payer: Self-pay | Admitting: Sports Medicine

## 2018-03-29 ENCOUNTER — Ambulatory Visit (INDEPENDENT_AMBULATORY_CARE_PROVIDER_SITE_OTHER): Payer: Medicaid Other | Admitting: Sports Medicine

## 2018-03-29 DIAGNOSIS — M25511 Pain in right shoulder: Secondary | ICD-10-CM | POA: Diagnosis not present

## 2018-03-29 DIAGNOSIS — G8929 Other chronic pain: Secondary | ICD-10-CM

## 2018-03-29 DIAGNOSIS — M75111 Incomplete rotator cuff tear or rupture of right shoulder, not specified as traumatic: Secondary | ICD-10-CM | POA: Diagnosis not present

## 2018-03-29 NOTE — Progress Notes (Signed)
   Procedure: Real-time Ultrasound Guided gadolinium contrast injection of right glenohumeral joint Device: GE Logiq E  Verbal informed consent obtained.  Time-out conducted.  Noted no overlying erythema, induration, or other signs of local infection.  Skin prepped in a sterile fashion.  Local anesthesia: Topical Ethyl chloride.  With sterile technique and under real time ultrasound guidance: Using a 22-gauge spinal needle advanced into the joint and injected 1 cc kenalog 40, 2 cc lidocaine, 2 cc bupivacaine, syringe switched and 0.1 cc gadolinium injected, syringe again switched and 5 mL iodinated contrast injected, syringe again switched in about 5 mL of sterile saline used to fully distend the joint. Joint visualized and capsule seen distending confirming intra-articular placement of contrast material and medication. Completed without difficulty  Advised to call if fevers/chills, erythema, induration, drainage, or persistent bleeding.  Images permanently stored and available for review in the ultrasound unit.  Impression: Technically successful ultrasound guided gadolinium contrast injection for MR arthrography.  Please see separate MR arthrogram report.

## 2018-03-29 NOTE — Assessment & Plan Note (Signed)
Contrast injection in anticipation of MRI arthrogram. See MRI report for further details, further management per ordering provider.

## 2018-03-30 ENCOUNTER — Other Ambulatory Visit: Payer: Self-pay | Admitting: *Deleted

## 2018-03-30 DIAGNOSIS — G8929 Other chronic pain: Secondary | ICD-10-CM

## 2018-03-30 DIAGNOSIS — M25511 Pain in right shoulder: Principal | ICD-10-CM

## 2018-04-05 ENCOUNTER — Other Ambulatory Visit: Payer: Medicaid Other

## 2018-04-19 NOTE — Pre-Procedure Instructions (Signed)
Garnette ScheuermannLeah B Borges  04/19/2018      [id number] - [place], [place] - [address] [place] [id number] Phone: [phone] Fax: [phone]    Your procedure is scheduled on Thursday August 29.  Report to [facility] at 12:30 A.M.  Call this number if you have problems the morning of surgery:  [phone]   Remember:  Do not eat or drink after midnight.    Take these medicines the morning of surgery with A SIP OF WATER:  None  7 days prior to surgery STOP taking any Aspirin(unless otherwise instructed by your surgeon), Aleve, Naproxen, Ibuprofen, Motrin, Advil, Goody's, BC's, all herbal medications, fish oil, and all vitamins     Do not wear jewelry, make-up or nail polish.  Do not wear lotions, powders, or perfumes, or deodorant.  Do not shave 48 hours prior to surgery.  Men may shave face and neck.  Do not bring valuables to the hospital.  [facility] is not responsible for any belongings or valuables.  Contacts, dentures or bridgework may not be worn into surgery.  Leave your suitcase in the car.  After surgery it may be brought to your room.  For patients admitted to the hospital, discharge time will be determined by your treatment team.  Patients discharged the day of surgery will not be allowed to drive home.   Special instructions:    [facility]- Preparing For Surgery  Before surgery, you can play an important role. Because skin is not sterile, your skin needs to be as free of germs as possible. You can reduce the number of germs on your skin by washing with CHG (chlorahexidine gluconate) Soap before surgery.  CHG is an antiseptic cleaner which kills germs and bonds with the skin to continue killing germs even after washing.    Oral Hygiene is also important to reduce your risk of infection.  Remember - BRUSH YOUR TEETH THE MORNING OF SURGERY WITH YOUR REGULAR  TOOTHPASTE  Please do not use if you have an allergy to CHG or antibacterial soaps. If your skin becomes reddened/irritated stop using the CHG.  Do not shave (including legs and underarms) for at least 48 hours prior to first CHG shower. It is OK to shave your face.  Please follow these instructions carefully.   1. Shower the NIGHT BEFORE SURGERY and the MORNING OF SURGERY with CHG.   2. If you chose to wash your hair, wash your hair first as usual with your normal shampoo.  3. After you shampoo, rinse your hair and body thoroughly to remove the shampoo.  4. Use CHG as you would any other liquid soap. You can apply CHG directly to the skin and wash gently with a scrungie or a clean washcloth.   5. Apply the CHG Soap to your body ONLY FROM THE NECK DOWN.  Do not use on open wounds or open sores. Avoid contact with your eyes, ears, mouth and genitals (private parts). Wash Face and genitals (private parts)  with your normal soap.  6. Wash thoroughly, paying special attention to the area where your surgery will be performed.  7. Thoroughly rinse your body with warm water from the neck down.  8. DO NOT shower/wash with your normal soap after using and rinsing off the CHG Soap.  9. Pat yourself dry with a CLEAN TOWEL.  10. Wear CLEAN PAJAMAS  to bed the night before surgery, wear comfortable clothes the morning of surgery  11. Place CLEAN SHEETS on your bed the night of your first shower and DO NOT SLEEP WITH PETS.    Day of Surgery:  Do not apply any deodorants/lotions.  Please wear clean clothes to the hospital/surgery center.   Remember to brush your teeth WITH YOUR REGULAR TOOTHPASTE.    Please read over the following fact sheets that you were given. Coughing and Deep Breathing and Surgical Site Infection Prevention

## 2018-04-20 ENCOUNTER — Encounter (HOSPITAL_COMMUNITY)
Admission: RE | Admit: 2018-04-20 | Discharge: 2018-04-20 | Disposition: A | Discharge status: Home / Self Care | Payer: Medicaid Other | Source: Ambulatory Visit | Attending: Orthopedic Surgery | Admitting: Orthopedic Surgery

## 2018-04-20 ENCOUNTER — Encounter (HOSPITAL_COMMUNITY): Payer: Self-pay

## 2018-04-20 ENCOUNTER — Other Ambulatory Visit: Payer: Self-pay

## 2018-04-20 DIAGNOSIS — Z01818 Encounter for other preprocedural examination: Secondary | ICD-10-CM | POA: Diagnosis present

## 2018-04-20 DIAGNOSIS — M75101 Unspecified rotator cuff tear or rupture of right shoulder, not specified as traumatic: Secondary | ICD-10-CM | POA: Insufficient documentation

## 2018-04-20 NOTE — Progress Notes (Addendum)
PCP: Leana GamerHilary Bucklear, PA-C  Cardiologist: pt denies  EKG: 04/16/18- on chart (medical clearance)  Stress test: pt denies  ECHO: pt denies  Cardiac Cath: pt denies  Chest x-ray: pt denies past year, no recent respiratory infections/complications  CBC, UA, HgbA1C all on medical clearance from 04/16/18-no labs required today

## 2018-04-21 NOTE — Progress Notes (Signed)
Anesthesia Chart Review:  Case:  [id number] Date/Time:  04/22/18 1415   Procedure:  Right shoulder arthroscopy with subacromial decompression, possible distal clavicle resection, rotator cuff repair (Right Shoulder) - 120min   Anesthesia type:  General   Pre-op diagnosis:  right shoulder rotator cuff tear   Location:  [facility] OR ROOM 06 / [facility] OR   Surgeon:  Francena HanlySupple, Kevin, MD      DISCUSSION: 42 yo female never smoker for above procedure. Pertinent hx includes Migranes, Seizures (not on meds, reports last seizure 2017).  Pt has medical clearance from PCP Joyce GrossHilary Buckler, PA-C on Chart dated 04/19/18 stating pt is low risk.  Anticipate she can proceed with surgery as planned barring acute status change.  VS: BP 138/83 Comment: manually rechecked by Posey ProntoEmily Fuller, RN  Pulse 67   Temp 36.4 C (Oral)   Resp 18   Ht 5\' 2"  (1.575 m)   Wt 63.7 kg   LMP 04/19/2018   SpO2 100%   BMI 25.69 kg/m   PROVIDERS: Joyce GrossBuckler, Hilary, PA-C is PCP last seen 04/16/2018   LABS: Labs reviewed: Acceptable for surgery. CBC, UA, A1c performed at PCP's office 04/16/2018, results on chart. Mild anemia with H/H 11.8/35.0. Labs otherwise WNL.   IMAGES: PORTABLE CHEST 1 VIEW 07/05/2015  COMPARISON:  08/16/2014  FINDINGS: The cardiac silhouette, mediastinal and hilar contours are within normal limits given the AP projection of the film. The lungs are clear. No pleural effusion. The bony thorax is intact.  IMPRESSION: No acute cardiopulmonary findings.   EKG: 04/16/2018 (outside record, copy on pt chart): Sinus rhythm.  CV: N/A  Past Medical History:  Diagnosis Date  . Ectopic pregnancy   . Migraine   . Seizures (HCC)    not on any medications per MD, last seizure was sometime in 2017    Past Surgical History:  Procedure Laterality Date  . Gyn surgery    . Lumps in rt breast    . RHINOPLASTY      MEDICATIONS: . amitriptyline (ELAVIL) 10 MG tablet  . COPPER PO  . cyclobenzaprine (FLEXERIL) 10  MG tablet  . diclofenac sodium (VOLTAREN) 1 % GEL  . EMGALITY 120 MG/ML SOAJ  . indomethacin (INDOCIN) 50 MG capsule  . naproxen (NAPROSYN) 500 MG tablet  . promethazine (PHENERGAN) 25 MG tablet   No current facility-administered medications for this encounter.

## 2018-04-22 ENCOUNTER — Ambulatory Visit (HOSPITAL_COMMUNITY)
Admission: RE | Admit: 2018-04-22 | Discharge: 2018-04-22 | Disposition: A | Discharge status: Home / Self Care | Payer: Medicaid Other | Source: Ambulatory Visit | Attending: Orthopedic Surgery | Admitting: Orthopedic Surgery

## 2018-04-22 ENCOUNTER — Encounter (HOSPITAL_COMMUNITY)
Admission: RE | Disposition: A | Discharge status: Home / Self Care | Payer: Self-pay | Source: Ambulatory Visit | Attending: Orthopedic Surgery

## 2018-04-22 ENCOUNTER — Other Ambulatory Visit: Payer: Self-pay

## 2018-04-22 ENCOUNTER — Ambulatory Visit (HOSPITAL_COMMUNITY): Payer: Medicaid Other | Admitting: Physician Assistant

## 2018-04-22 ENCOUNTER — Encounter (HOSPITAL_COMMUNITY): Payer: Self-pay | Admitting: *Deleted

## 2018-04-22 ENCOUNTER — Ambulatory Visit (HOSPITAL_COMMUNITY): Payer: Medicaid Other | Admitting: Anesthesiology

## 2018-04-22 DIAGNOSIS — M25511 Pain in right shoulder: Secondary | ICD-10-CM

## 2018-04-22 DIAGNOSIS — Y93B1 Activity, exercise machines primarily for muscle strengthening: Secondary | ICD-10-CM | POA: Insufficient documentation

## 2018-04-22 DIAGNOSIS — S46011A Strain of muscle(s) and tendon(s) of the rotator cuff of right shoulder, initial encounter: Secondary | ICD-10-CM | POA: Insufficient documentation

## 2018-04-22 DIAGNOSIS — X509XXA Other and unspecified overexertion or strenuous movements or postures, initial encounter: Secondary | ICD-10-CM | POA: Insufficient documentation

## 2018-04-22 DIAGNOSIS — R569 Unspecified convulsions: Secondary | ICD-10-CM | POA: Diagnosis not present

## 2018-04-22 DIAGNOSIS — Z79899 Other long term (current) drug therapy: Secondary | ICD-10-CM | POA: Insufficient documentation

## 2018-04-22 DIAGNOSIS — S43431A Superior glenoid labrum lesion of right shoulder, initial encounter: Secondary | ICD-10-CM | POA: Insufficient documentation

## 2018-04-22 DIAGNOSIS — Z885 Allergy status to narcotic agent status: Secondary | ICD-10-CM | POA: Diagnosis not present

## 2018-04-22 DIAGNOSIS — G8929 Other chronic pain: Secondary | ICD-10-CM

## 2018-04-22 DIAGNOSIS — Z888 Allergy status to other drugs, medicaments and biological substances status: Secondary | ICD-10-CM | POA: Diagnosis not present

## 2018-04-22 DIAGNOSIS — Z91040 Latex allergy status: Secondary | ICD-10-CM | POA: Insufficient documentation

## 2018-04-22 HISTORY — PX: SHOULDER ARTHROSCOPY WITH ROTATOR CUFF REPAIR: SHX5685

## 2018-04-22 LAB — POCT PREGNANCY, URINE: Preg Test, Ur: NEGATIVE

## 2018-04-22 SURGERY — ARTHROSCOPY, SHOULDER, WITH ROTATOR CUFF REPAIR
Anesthesia: Regional | Site: Shoulder | Laterality: Right

## 2018-04-22 MED ORDER — PROPOFOL 10 MG/ML IV BOLUS
INTRAVENOUS | Status: DC | PRN
  Administered 2018-04-22: 120 mg via INTRAVENOUS

## 2018-04-22 MED ORDER — FENTANYL CITRATE (PF) 100 MCG/2ML IJ SOLN
INTRAMUSCULAR | Status: DC | PRN
  Administered 2018-04-22: 50 ug via INTRAVENOUS

## 2018-04-22 MED ORDER — ONDANSETRON HCL 4 MG/2ML IJ SOLN
INTRAMUSCULAR | Status: DC | PRN
  Administered 2018-04-22: 4 mg via INTRAVENOUS

## 2018-04-22 MED ORDER — ROCURONIUM BROMIDE 50 MG/5ML IV SOSY
PREFILLED_SYRINGE | INTRAVENOUS | Status: AC
  Filled 2018-04-22: qty 10

## 2018-04-22 MED ORDER — CEFAZOLIN SODIUM-DEXTROSE 2-4 GM/100ML-% IV SOLN
2.0000 g | INTRAVENOUS | Status: AC
  Administered 2018-04-22: 2 g via INTRAVENOUS
  Filled 2018-04-22: qty 100

## 2018-04-22 MED ORDER — LACTATED RINGERS IV SOLN
INTRAVENOUS | Status: DC
  Administered 2018-04-22 (×2): via INTRAVENOUS

## 2018-04-22 MED ORDER — EPHEDRINE SULFATE-NACL 50-0.9 MG/10ML-% IV SOSY
PREFILLED_SYRINGE | INTRAVENOUS | Status: DC | PRN
  Administered 2018-04-22: 5 mg via INTRAVENOUS
  Administered 2018-04-22: 10 mg via INTRAVENOUS
  Administered 2018-04-22: 5 mg via INTRAVENOUS

## 2018-04-22 MED ORDER — ONDANSETRON HCL 4 MG/2ML IJ SOLN
INTRAMUSCULAR | Status: AC
  Filled 2018-04-22: qty 2

## 2018-04-22 MED ORDER — FENTANYL CITRATE (PF) 250 MCG/5ML IJ SOLN
INTRAMUSCULAR | Status: AC
  Filled 2018-04-22: qty 5

## 2018-04-22 MED ORDER — MIDAZOLAM HCL 2 MG/2ML IJ SOLN
2.0000 mg | Freq: Once | INTRAMUSCULAR | Status: AC
  Administered 2018-04-22: 2 mg via INTRAVENOUS

## 2018-04-22 MED ORDER — CYCLOBENZAPRINE HCL 10 MG PO TABS: 10.0000 mg | ORAL_TABLET | Freq: Three times a day (TID) | ORAL | 0 refills | Status: DC | PRN

## 2018-04-22 MED ORDER — SUGAMMADEX SODIUM 200 MG/2ML IV SOLN
INTRAVENOUS | Status: DC | PRN
  Administered 2018-04-22: 200 mg via INTRAVENOUS

## 2018-04-22 MED ORDER — CHLORHEXIDINE GLUCONATE 4 % EX LIQD: 60.0000 mL | Freq: Once | CUTANEOUS | Status: DC

## 2018-04-22 MED ORDER — FENTANYL CITRATE (PF) 100 MCG/2ML IJ SOLN
INTRAMUSCULAR | Status: AC
  Administered 2018-04-22: 50 ug via INTRAVENOUS
  Filled 2018-04-22: qty 2

## 2018-04-22 MED ORDER — MIDAZOLAM HCL 2 MG/2ML IJ SOLN
INTRAMUSCULAR | Status: AC
  Administered 2018-04-22: 2 mg via INTRAVENOUS
  Filled 2018-04-22: qty 2

## 2018-04-22 MED ORDER — PROMETHAZINE HCL 25 MG/ML IJ SOLN: 6.2500 mg | INTRAMUSCULAR | Status: DC | PRN

## 2018-04-22 MED ORDER — EPHEDRINE 5 MG/ML INJ
INTRAVENOUS | Status: AC
  Filled 2018-04-22: qty 10

## 2018-04-22 MED ORDER — OXYCODONE-ACETAMINOPHEN 5-325 MG PO TABS: 1.0000 | ORAL_TABLET | ORAL | 0 refills | Status: DC | PRN

## 2018-04-22 MED ORDER — ONDANSETRON HCL 4 MG PO TABS: 4.0000 mg | ORAL_TABLET | Freq: Three times a day (TID) | ORAL | 0 refills | Status: DC | PRN

## 2018-04-22 MED ORDER — DEXAMETHASONE SODIUM PHOSPHATE 10 MG/ML IJ SOLN
INTRAMUSCULAR | Status: DC | PRN
  Administered 2018-04-22: 10 mg via INTRAVENOUS

## 2018-04-22 MED ORDER — BUPIVACAINE HCL (PF) 0.5 % IJ SOLN
INTRAMUSCULAR | Status: DC | PRN
  Administered 2018-04-22: 15 mL via PERINEURAL

## 2018-04-22 MED ORDER — NAPROXEN 500 MG PO TABS: 500.0000 mg | ORAL_TABLET | Freq: Two times a day (BID) | ORAL | 1 refills | Status: DC

## 2018-04-22 MED ORDER — PROPOFOL 10 MG/ML IV BOLUS
INTRAVENOUS | Status: AC
  Filled 2018-04-22: qty 20

## 2018-04-22 MED ORDER — FENTANYL CITRATE (PF) 100 MCG/2ML IJ SOLN: 25.0000 ug | INTRAMUSCULAR | Status: DC | PRN

## 2018-04-22 MED ORDER — FENTANYL CITRATE (PF) 100 MCG/2ML IJ SOLN
50.0000 ug | Freq: Once | INTRAMUSCULAR | Status: AC
  Administered 2018-04-22: 50 ug via INTRAVENOUS

## 2018-04-22 MED ORDER — DEXAMETHASONE SODIUM PHOSPHATE 10 MG/ML IJ SOLN
INTRAMUSCULAR | Status: AC
  Filled 2018-04-22: qty 1

## 2018-04-22 MED ORDER — ROCURONIUM BROMIDE 50 MG/5ML IV SOSY
PREFILLED_SYRINGE | INTRAVENOUS | Status: DC | PRN
  Administered 2018-04-22: 40 mg via INTRAVENOUS

## 2018-04-22 MED ORDER — SODIUM CHLORIDE 0.9 % IR SOLN
Status: DC | PRN
  Administered 2018-04-22: 6000 mL

## 2018-04-22 MED ORDER — BUPIVACAINE LIPOSOME 1.3 % IJ SUSP
INTRAMUSCULAR | Status: DC | PRN
  Administered 2018-04-22: 10 mL via PERINEURAL

## 2018-04-22 SURGICAL SUPPLY — 62 items
ANCHOR PEEK 4.75X19.1 SWLK C (Anchor) ×6 IMPLANT
BLADE CUTTER GATOR 3.5 (BLADE) ×2 IMPLANT
BLADE GREAT WHITE 4.2 (BLADE) ×2 IMPLANT
BLADE SURG 11 STRL SS (BLADE) ×2 IMPLANT
BOOTCOVER CLEANROOM LRG (PROTECTIVE WEAR) ×4 IMPLANT
BUR OVAL 4.0 (BURR) ×2 IMPLANT
CANISTER SUCT LVC 12 LTR MEDI- (MISCELLANEOUS) ×2 IMPLANT
CANNULA ACUFLEX KIT 5X76 (CANNULA) ×2 IMPLANT
CANNULA DRILOCK 5.0X75 (CANNULA) ×4 IMPLANT
CANNULA TWIST IN 8.25X7CM (CANNULA) ×4 IMPLANT
CLSR STERI-STRIP ANTIMIC 1/2X4 (GAUZE/BANDAGES/DRESSINGS) ×2 IMPLANT
CONNECTOR 5 IN 1 STRAIGHT STRL (MISCELLANEOUS) ×2 IMPLANT
DRAPE INCISE 23X17 IOBAN STRL (DRAPES) ×1
DRAPE INCISE IOBAN 23X17 STRL (DRAPES) ×1 IMPLANT
DRAPE INCISE IOBAN 66X45 STRL (DRAPES) ×2 IMPLANT
DRAPE ORTHO SPLIT 77X108 STRL (DRAPES) ×2
DRAPE STERI 35X30 U-POUCH (DRAPES) ×2 IMPLANT
DRAPE SURG 17X11 SM STRL (DRAPES) ×2 IMPLANT
DRAPE SURG ORHT 6 SPLT 77X108 (DRAPES) ×2 IMPLANT
DRAPE U-SHAPE 47X51 STRL (DRAPES) ×2 IMPLANT
DRSG PAD ABDOMINAL 8X10 ST (GAUZE/BANDAGES/DRESSINGS) ×4 IMPLANT
DURAPREP 26ML APPLICATOR (WOUND CARE) ×4 IMPLANT
FIBER TAPE 2MM (SUTURE) IMPLANT
GAUZE SPONGE 4X4 12PLY STRL (GAUZE/BANDAGES/DRESSINGS) ×2 IMPLANT
GAUZE SPONGE 4X4 12PLY STRL LF (GAUZE/BANDAGES/DRESSINGS) ×2 IMPLANT
GLOVE BIO SURGEON STRL SZ7.5 (GLOVE) ×2 IMPLANT
GLOVE BIO SURGEON STRL SZ8 (GLOVE) ×2 IMPLANT
GLOVE EUDERMIC 7 POWDERFREE (GLOVE) ×2 IMPLANT
GLOVE SS BIOGEL STRL SZ 7.5 (GLOVE) ×1 IMPLANT
GLOVE SUPERSENSE BIOGEL SZ 7.5 (GLOVE) ×1
GOWN STRL REUS W/ TWL LRG LVL3 (GOWN DISPOSABLE) ×1 IMPLANT
GOWN STRL REUS W/ TWL XL LVL3 (GOWN DISPOSABLE) ×2 IMPLANT
GOWN STRL REUS W/TWL LRG LVL3 (GOWN DISPOSABLE) ×1
GOWN STRL REUS W/TWL XL LVL3 (GOWN DISPOSABLE) ×2
KIT BASIN OR (CUSTOM PROCEDURE TRAY) ×2 IMPLANT
KIT SHOULDER TRACTION (DRAPES) ×2 IMPLANT
KIT TURNOVER KIT B (KITS) ×2 IMPLANT
MANIFOLD NEPTUNE II (INSTRUMENTS) ×2 IMPLANT
NEEDLE SCORPION MULTI FIRE (NEEDLE) ×2 IMPLANT
NEEDLE SPNL 18GX3.5 QUINCKE PK (NEEDLE) ×2 IMPLANT
NS IRRIG 1000ML POUR BTL (IV SOLUTION) ×2 IMPLANT
PACK SHOULDER (CUSTOM PROCEDURE TRAY) ×2 IMPLANT
PAD ABD 8X10 STRL (GAUZE/BANDAGES/DRESSINGS) ×2 IMPLANT
PAD ARMBOARD 7.5X6 YLW CONV (MISCELLANEOUS) ×4 IMPLANT
PROBE APOLLO 90XL (SURGICAL WAND) ×2 IMPLANT
SET ARTHROSCOPY TUBING (MISCELLANEOUS) ×1
SET ARTHROSCOPY TUBING LN (MISCELLANEOUS) ×1 IMPLANT
SLING ARM FOAM STRAP LRG (SOFTGOODS) ×2 IMPLANT
SLING ARM FOAM STRAP MED (SOFTGOODS) IMPLANT
SPONGE LAP 4X18 RFD (DISPOSABLE) IMPLANT
STRIP CLOSURE SKIN 1/2X4 (GAUZE/BANDAGES/DRESSINGS) ×2 IMPLANT
SUT MNCRL AB 3-0 PS2 18 (SUTURE) ×2 IMPLANT
SUT PDS AB 0 CT 36 (SUTURE) IMPLANT
SUT RETRIEVER GRASP 30 DEG (SUTURE) IMPLANT
SUT TIGER TAPE 7 IN WHITE (SUTURE) IMPLANT
SYR 20CC LL (SYRINGE) ×2 IMPLANT
TAPE CLOTH SURG 4X10 WHT LF (GAUZE/BANDAGES/DRESSINGS) ×2 IMPLANT
TAPE PAPER 3X10 WHT MICROPORE (GAUZE/BANDAGES/DRESSINGS) ×2 IMPLANT
TOWEL OR 17X24 6PK STRL BLUE (TOWEL DISPOSABLE) ×2 IMPLANT
TOWEL OR 17X26 10 PK STRL BLUE (TOWEL DISPOSABLE) ×2 IMPLANT
WAND SUCTION MAX 4MM 90S (SURGICAL WAND) ×2 IMPLANT
WATER STERILE IRR 1000ML POUR (IV SOLUTION) ×2 IMPLANT

## 2018-04-22 NOTE — Op Note (Addendum)
04/22/2018  4:08 PM  PATIENT:   Garnette ScheuermannLeah B Keadle  42 y.o. female  PRE-OPERATIVE DIAGNOSIS: Traumatic right shoulder rotator cuff tear  POST-OPERATIVE DIAGNOSIS: Same with additional finding of glenoid labral tear  PROCEDURE:  1.  Right shoulder examination under anesthesia  2.  Right shoulder glenohumeral joint diagnostic arthroscopy  3.  Debridement of labral tear  4.  Arthroscopic subacromial bursectomy  5.  Arthroscopic rotator cuff repair  SURGEON:  Lajoya Dombek, Vania ReaKevin M. M.D.  ASSISTANTS: Ralene Batheracy Shuford, PA-C  ANESTHESIA:   General endotracheal as well as an interscalene block with Exparel  EBL: Minimal  SPECIMEN: None  Drains: None   PATIENT DISPOSITION:  PACU - hemodynamically stable.    PLAN OF CARE: Discharge to home after PACU  Brief history:  Patient is a 42 year old female who injured her right shoulder while weightlifting is had continued difficulties with pain and limited motion.  Preoperative MRI scan confirms a full-thickness rotator cuff tear.  No obvious AC joint arthritis due to her ongoing pain and functional mentation she is brought to the operating this time for planned right shoulder arthroscopy with anticipated rotator cuff repair.  Preoperatively and counseled Ms. Su GrandSeymour regarding treatment options as well as the potential risks versus benefits thereof.  Possible surgical complications were reviewed including potential for bleeding, infection, neurovascular injury, persistent pain, loss of motion, recurrence of rotator cuff tear, anesthetic complication, and possible need for additional surgery.  She understands and accepts and agrees with her planned procedure.  Procedure in detail:  After undergoing routine preop evaluation patient received prophylactic antibiotics and interscalene block with Exparel was performed in the holding area by the anesthesia department.  Brought to the operating placed supine on the operative table underwent smooth  induction of a general endotracheal anesthesia.  Turned to the left lateral decubitus position on the beanbag and appropriate padding protected.  Right shoulder examination under anesthesia revealed full motion.  No instability patterns were noted.  Right arm was then suspended at 70 degrees of abduction with 10 pounds of traction in the right shoulder girdle region was sterilely prepped and draped in standard fashion.  Timeout was called.  Posterior portal established into the glenohumeral joint intraportal established under direct visualization.  Capsular volume was within normal limits.  Articular surfaces were pristine condition.  There is an anterior superior labral tear which were debrided with shaver back to stable margin.  The biceps tendon was of normal caliber with no evidence for proximal or distal instability.  Rotator cuff showed a obvious full-thickness defect involving the distal supraspinatus to did perform a debridement of the rotator cuff tear from the articular side.  Balance of the glenoid joint showed no additional pathologies.  Fluid and instruments were then removed.  The arms it dropped down to 30 degrees of abduction with arthroscope introduced into the subacromial space of the posterior portal and a direct lateral portal established in the subacromial space.  Abundant dense bursal tissue was encountered and this was excised and bursectomy was then completed.  Hemostasis was obtained.  We identified the rotator cuff tear this is debrided from the bursal side back to healthy tissue and the greater tuberosity was then prepared removing soft tissue with the ablator and then abrading the bone of the tuberosity to bleeding bed  Apparent was approximately 2 cm in width.  Through a stab wound off the lateral margin of the acromion placed an Arthrex peek swivel lock suture anchor loaded with a fiber tape.  The  4 suture limbs were then shuttled equidistant across the width of the rotator cuff tear  using the scorpion suture passer.  The suture limbs were then placed into 2 Lateral Row swivel lock suture anchors creating a double repair which allowed excellent re-apposition of the rotator cuff tendon down to the bony bed and tuberosity overall construct is much to our satisfaction.  Suture limbs were then clipped.  Final hemostasis was obtained.  Fluid analysis removed.  The portals were closed with Monocryl and a Steri-Strip.  Dry dressing taped at the right shoulder right and was placed in sling immobilizer and the patient was awakened extubated and taken to the recovery room in stable condition  Ralene Bathe PA-C was used as an Geophysicist/field seismologist throughout this case essential for help with positioning the patient, position extremity, tissue ablation, suture management, wound closure, and intraoperative decision-making.  Senaida Lange MD   Contact # [phone]

## 2018-04-22 NOTE — Anesthesia Preprocedure Evaluation (Addendum)
Anesthesia Evaluation  Patient identified by MRN, date of birth, ID band Patient awake    Reviewed: Allergy & Precautions, NPO status , Patient's Chart, lab work & pertinent test results  Airway Mallampati: II  TM Distance: >3 FB Neck ROM: Full    Dental  (+) Dental Advisory Given, Teeth Intact   Pulmonary neg pulmonary ROS,    breath sounds clear to auscultation       Cardiovascular negative cardio ROS   Rhythm:Regular Rate:Normal     Neuro/Psych  Headaches, Seizures -,     GI/Hepatic negative GI ROS, Neg liver ROS,   Endo/Other  negative endocrine ROS  Renal/GU negative Renal ROS     Musculoskeletal   Abdominal   Peds  Hematology negative hematology ROS (+)   Anesthesia Other Findings   Reproductive/Obstetrics                             Anesthesia Physical Anesthesia Plan  ASA: I  Anesthesia Plan: General   Post-op Pain Management:  Regional for Post-op pain   Induction: Intravenous  PONV Risk Score and Plan: 3 and Ondansetron, Dexamethasone, Midazolam and Treatment may vary due to age or medical condition  Airway Management Planned: Oral ETT  Additional Equipment:   Intra-op Plan:   Post-operative Plan: Extubation in OR  Informed Consent: I have reviewed the patients History and Physical, chart, labs and discussed the procedure including the risks, benefits and alternatives for the proposed anesthesia with the patient or authorized representative who has indicated his/her understanding and acceptance.   Dental advisory given  Plan Discussed with: CRNA  Anesthesia Plan Comments:         Anesthesia Quick Evaluation

## 2018-04-22 NOTE — Progress Notes (Signed)
Orthopedic Tech Progress Note Patient Details:  Susan ScheuermannLeah B Mcpherson Oct 17, 1975 [id number]  Ortho Devices Type of Ortho Device: Abduction pillow Ortho Device/Splint Location: Roe Coombson joy       Saul FordyceJennifer C Brinlynn Gorton 04/22/2018, 5:13 PM

## 2018-04-22 NOTE — Anesthesia Procedure Notes (Signed)
Procedure Name: Intubation Date/Time: 04/22/2018 3:01 PM Performed by: Kyung Rudd, CRNA Pre-anesthesia Checklist: Patient identified, Emergency Drugs available, Suction available, Patient being monitored and Timeout performed Patient Re-evaluated:Patient Re-evaluated prior to induction Oxygen Delivery Method: Circle system utilized Preoxygenation: Pre-oxygenation with 100% oxygen Induction Type: IV induction Ventilation: Mask ventilation without difficulty Laryngoscope Size: Mac and 3 Grade View: Grade I Tube type: Oral Tube size: 7.0 mm Number of attempts: 1 Airway Equipment and Method: Stylet Placement Confirmation: ETT inserted through vocal cords under direct vision,  positive ETCO2 and breath sounds checked- equal and bilateral Secured at: 21 cm Tube secured with: Tape Dental Injury: Teeth and Oropharynx as per pre-operative assessment

## 2018-04-22 NOTE — Discharge Instructions (Signed)
° °Kevin M. Supple, M.D., F.A.A.O.S. °Orthopaedic Surgery °Specializing in Arthroscopic and Reconstructive °Surgery of the Shoulder and Knee °[phone] °[address] - [place], [place] [address] - Fax [phone] ° °POST-OP SHOULDER ARTHROSCOPIC ROTATOR CUFF REPAIR INSTRUCTIONS ° °1. Call the office at [phone] to schedule your first post-op appointment 7-10 days from the date of your surgery. ° °2. Leave the steri-strips in place over your incisions when performing dressing changes and showering. You may remove your dressings and begin showering 72 hours from surgery. You can expect drainage that is clear to bloody in nature that occasionally will soak through your dressings. If this occurs go ahead and perform a dressing change. The drainage should lessen daily and when there is no drainage from your incisions feel free to go without a dressing. ° °3. Wear your sling/immobilizer at all times except to perform the exercises below or to occasionally let your arm dangle by your side to stretch your elbow. You also need to sleep in your sling immobilizer until instructed otherwise. ° °4. Range of motion to your elbow, wrist, and hand are encouraged 3-5 times daily. Exercise to your hand and fingers helps to reduce swelling you may experience. ° °5. Utilize ice to the shoulder 3-4 times minimum a day and additionally if you are experiencing pain. ° °6. You may one-armed drive when safely off of narcotics and muscle relaxants. You may use your hand that is in the sling to support the steering wheel only. However, should it be your right arm that is in the sling it is not to be used for gear shifting in a manual transmission. ° °7. If you had a block pre-operatively to provide post-op pain relief you may want to go ahead and begin utilizing your pain meds as your arm begins to wake up. Blocks can sometimes last up to 16-18 hours. If you are still pain-free prior to going to bed you may want to  strongly consider taking a pain medication to avoid being awakened in the night with the onset of pain. A muscle relaxant is also provided for you should you experience muscle spasms. It is recommended that if you are experiencing pain that your pain medication alone is not controlling, add the muscle relaxant along with the pain medication which can give additional pain relief. The first one to two days is generally the most severe of your pain and then should gradually decrease. As your pain lessens it is recommended that you decrease your use of the pain medications to an "as needed basis" only and to always comply with the recommended dosages of the pain medications. ° °8. Pain medications can produce constipation along with their use. If you experience this, the use of an over the counter stool softener or laxative daily is recommended.  ° °9. For additional questions or concerns, please do not hesitate to call the office. If after hours there is an answering service to forward your concerns to the physician on call. ° °10.Pain control following an exparel block ° °To help control your post-operative pain you received a nerve block  performed with Exparel which is a long acting anesthetic (numbing agent) which can provide pain relief and sensations of numbness (and relief of pain) in the operative shoulder and arm for up to 3 days. Sometimes it provides mixed relief, meaning you may still have numbness in certain areas of the arm but can still be able to move  parts of that arm, hand, and   fingers. We recommend that your prescribed pain medications  be used as needed. We do not feel it is necessary to "pre medicate" and "stay ahead" of pain.  Taking narcotic pain medications when you are not having any pain can lead to unnecessary and potentially dangerous side effects.  ° °POST-OP EXERCISES ° °Pendulum Exercises ° °Perform pendulum exercises while standing and bending at the waist. Support your uninvolved arm  on a table or chair and allow your operated arm to hang freely. Make sure to do these exercises passively - not using you shoulder muscle. ° °Repeat 20 times. Do 3 sessions per day. ° ° °

## 2018-04-22 NOTE — Anesthesia Procedure Notes (Addendum)
Anesthesia Regional Block: Interscalene brachial plexus block   Pre-Anesthetic Checklist: ,, timeout performed, Correct Patient, Correct Site, Correct Laterality, Correct Procedure, Correct Position, site marked, Risks and benefits discussed,  Surgical consent,  Pre-op evaluation,  At surgeon's request and post-op pain management  Laterality: Right  Prep: chloraprep       Needles:  Injection technique: Single-shot  Needle Type: Echogenic Stimulator Needle     Needle Length: 9cm  Needle Gauge: 21     Additional Needles:   Procedures:, nerve stimulator,,, ultrasound used (permanent image in chart),,,,   Nerve Stimulator or Paresthesia:  Response: deltoid, 0.5 mA,   Additional Responses:   Narrative:  Start time: 04/22/2018 2:02 PM End time: 04/22/2018 2:09 PM Injection made incrementally with aspirations every 5 mL.  Performed by: Personally  Anesthesiologist: Marcene DuosFitzgerald, Jinnie Onley, MD

## 2018-04-22 NOTE — H&P (Signed)
Susan ScheuermannLeah B Mcpherson    Chief Complaint: right shoulder rotator cuff tear HPI: The patient is a 42 y.o. female with persistent right shoulder pain after weightlifting injury several months ago.  Clinical examination shows painful and guarded motion with localized weakness on rotator cuff testing.  MRI scan confirms a full-thickness rotator cuff tear.  Past Medical History:  Diagnosis Date  . Ectopic pregnancy   . Migraine   . Seizures (HCC)    not on any medications per MD, last seizure was sometime in 2017    Past Surgical History:  Procedure Laterality Date  . Gyn surgery    . Lumps in rt breast    . RHINOPLASTY      Family History  Problem Relation Age of Onset  . Hypertension Father     Social History:  reports that she has never smoked. She has never used smokeless tobacco. She reports that she does not drink alcohol or use drugs.   Medications Prior to Admission  Medication Sig Dispense Refill  . amitriptyline (ELAVIL) 10 MG tablet Take 10 mg by mouth at bedtime.  11  . COPPER PO 1 Device by Intrauterine route once.     Marland Kitchen. EMGALITY 120 MG/ML SOAJ Inject 120 mg into the skin every 28 (twenty-eight) days.  11  . indomethacin (INDOCIN) 50 MG capsule Take 50 mg by mouth 2 (two) times daily as needed. inflammation  1  . promethazine (PHENERGAN) 25 MG tablet Take 25 mg by mouth every 6 (six) hours as needed (for nausea due to migraine headaches).   0  . cyclobenzaprine (FLEXERIL) 10 MG tablet Take 1 tablet (10 mg total) by mouth 2 (two) times daily as needed. (Patient not taking: Reported on 03/29/2018) 20 tablet 0  . diclofenac sodium (VOLTAREN) 1 % GEL Apply 4 g topically 4 (four) times daily. To affected joint. (Patient not taking: Reported on 04/14/2018) 100 g 11  . naproxen (NAPROSYN) 500 MG tablet Take 1 tablet (500 mg total) by mouth 2 (two) times daily with a meal. (Patient not taking: Reported on 04/14/2018) 30 tablet 0     Physical Exam: Right shoulder examination is as  documented the recent office visits.  Painful and guarded motion with weakness to isolated testing of the rotator cuff.  Vitals  Temp:  [98.2 F (36.8 C)] 98.2 F (36.8 C) (08/29 1246) Pulse Rate:  [65-72] 67 (08/29 1422) Resp:  [12-20] 20 (08/29 1422) BP: (161-179)/(96-102) 171/96 (08/29 1422) SpO2:  [98 %-100 %] 98 % (08/29 1422) Weight:  [59 kg-59.9 kg] 59 kg (08/29 1304)  Assessment/Plan  Impression: right shoulder rotator cuff tear  Plan of Action: Procedure(s): Right shoulder arthroscopy with subacromial decompression, possible distal clavicle resection, rotator cuff repair  Susan Mcpherson M Laberta Wilbon 04/22/2018, 2:31 PM Contact # [phone]

## 2018-04-22 NOTE — Transfer of Care (Signed)
Immediate Anesthesia Transfer of Care Note  Patient: Susan Mcpherson  Procedure(s) Performed: Right shoulder arthroscopy with subacromial decompression, distal clavicle resection, rotator cuff repair (Right Shoulder)  Patient Location: PACU  Anesthesia Type:GA combined with regional for post-op pain  Level of Consciousness: awake, alert  and oriented  Airway & Oxygen Therapy: Patient Spontanous Breathing and Patient connected to nasal cannula oxygen  Post-op Assessment: Report given to RN, Post -op Vital signs reviewed and stable and Patient moving all extremities  Post vital signs: Reviewed and stable  Last Vitals:  Vitals Value Taken Time  BP 138/84 04/22/2018  4:31 PM  Temp 36.4 C 04/22/2018  4:30 PM  Pulse 71 04/22/2018  4:35 PM  Resp 16 04/22/2018  4:35 PM  SpO2 100 % 04/22/2018  4:35 PM  Vitals shown include unvalidated device data.  Last Pain:  Vitals:   04/22/18 1630  TempSrc:   PainSc: 0-No pain         Complications: No apparent anesthesia complications

## 2018-04-23 ENCOUNTER — Encounter (HOSPITAL_COMMUNITY): Payer: Self-pay | Admitting: Orthopedic Surgery

## 2018-04-23 ENCOUNTER — Encounter: Payer: Self-pay | Admitting: Family Medicine

## 2018-04-23 NOTE — Progress Notes (Signed)
[facility]  Patient found to have full-thickness tear of the supraspinatus on MRI arthrogram.  Plan for surgical repair Will be sent to scan.

## 2018-04-23 NOTE — Anesthesia Postprocedure Evaluation (Signed)
Anesthesia Post Note  Patient: Susan Mcpherson  Procedure(s) Performed: Right shoulder arthroscopy with subacromial decompression, distal clavicle resection, rotator cuff repair (Right Shoulder)     Patient location during evaluation: PACU Anesthesia Type: General Level of consciousness: awake and alert Pain management: pain level controlled Vital Signs Assessment: post-procedure vital signs reviewed and stable Respiratory status: spontaneous breathing, nonlabored ventilation and respiratory function stable Cardiovascular status: blood pressure returned to baseline and stable Postop Assessment: no apparent nausea or vomiting Anesthetic complications: no    Last Vitals:  Vitals:   04/22/18 1708 04/22/18 1714  BP: (!) 150/87 (!) 153/98  Pulse: 71 66  Resp: 16 18  Temp: 36.6 C   SpO2: 100% 100%    Last Pain:  Vitals:   04/22/18 1714  TempSrc:   PainSc: 0-No pain                 Beryle Lathehomas E Maudie Shingledecker

## 2018-07-29 ENCOUNTER — Encounter: Payer: Self-pay | Admitting: Family Medicine

## 2018-07-29 ENCOUNTER — Telehealth: Payer: Self-pay | Admitting: Family Medicine

## 2018-07-29 NOTE — Telephone Encounter (Signed)
[facility].  Patient is having significant improvement 14 weeks status post rotator cuff repair.  Doing well with physical therapy and will be proceeding to advance activity as tolerated in gym.

## 2018-08-18 ENCOUNTER — Encounter (HOSPITAL_BASED_OUTPATIENT_CLINIC_OR_DEPARTMENT_OTHER): Payer: Self-pay | Admitting: Emergency Medicine

## 2018-08-18 ENCOUNTER — Other Ambulatory Visit: Payer: Self-pay

## 2018-08-18 ENCOUNTER — Emergency Department (HOSPITAL_BASED_OUTPATIENT_CLINIC_OR_DEPARTMENT_OTHER)
Admission: EM | Admit: 2018-08-18 | Discharge: 2018-08-18 | Disposition: A | Discharge status: Home / Self Care | Payer: Medicaid Other | Attending: Emergency Medicine | Admitting: Emergency Medicine

## 2018-08-18 DIAGNOSIS — Z91040 Latex allergy status: Secondary | ICD-10-CM | POA: Insufficient documentation

## 2018-08-18 DIAGNOSIS — G43909 Migraine, unspecified, not intractable, without status migrainosus: Secondary | ICD-10-CM | POA: Diagnosis not present

## 2018-08-18 DIAGNOSIS — Z79899 Other long term (current) drug therapy: Secondary | ICD-10-CM | POA: Insufficient documentation

## 2018-08-18 DIAGNOSIS — R51 Headache: Secondary | ICD-10-CM | POA: Diagnosis present

## 2018-08-18 MED ORDER — KETOROLAC TROMETHAMINE 30 MG/ML IJ SOLN
30.0000 mg | Freq: Once | INTRAMUSCULAR | Status: AC
  Administered 2018-08-18: 30 mg via INTRAVENOUS
  Filled 2018-08-18: qty 1

## 2018-08-18 MED ORDER — METOCLOPRAMIDE HCL 5 MG/ML IJ SOLN
10.0000 mg | Freq: Once | INTRAMUSCULAR | Status: AC
  Administered 2018-08-18: 10 mg via INTRAVENOUS
  Filled 2018-08-18: qty 2

## 2018-08-18 MED ORDER — DIPHENHYDRAMINE HCL 50 MG/ML IJ SOLN
25.0000 mg | Freq: Once | INTRAMUSCULAR | Status: AC
  Administered 2018-08-18: 25 mg via INTRAVENOUS
  Filled 2018-08-18: qty 1

## 2018-08-18 MED ORDER — SODIUM CHLORIDE 0.9 % IV BOLUS
1000.0000 mL | Freq: Once | INTRAVENOUS | Status: AC
  Administered 2018-08-18: 1000 mL via INTRAVENOUS

## 2018-08-18 MED ORDER — DEXAMETHASONE SODIUM PHOSPHATE 10 MG/ML IJ SOLN
10.0000 mg | Freq: Once | INTRAMUSCULAR | Status: AC
  Administered 2018-08-18: 10 mg via INTRAVENOUS
  Filled 2018-08-18: qty 1

## 2018-08-18 NOTE — ED Provider Notes (Signed)
[facility] EMERGENCY DEPARTMENT Provider Note   CSN: [id number] Arrival date & time: 08/18/18  1850     History   Chief Complaint Chief Complaint  Patient presents with  . Migraine    HPI Susan Mcpherson is a 42 y.o. female.  The history is provided by the patient, a relative and medical records. No language interpreter was used.  Migraine  This is a recurrent problem. The current episode started yesterday. The problem occurs constantly. The problem has not changed since onset.Associated symptoms include headaches. Pertinent negatives include no chest pain, no abdominal pain and no shortness of breath. Nothing aggravates the symptoms. Nothing relieves the symptoms. She has tried nothing for the symptoms. The treatment provided no relief.    Past Medical History:  Diagnosis Date  . Ectopic pregnancy   . Migraine   . Seizures (HCC)    not on any medications per MD, last seizure was sometime in 2017    Patient Active Problem List   Diagnosis Date Noted  . Right shoulder pain 03/29/2018  . IUD (intrauterine device) in place 12/26/2016  . Migraine without aura and without status migrainosus, not intractable 10/08/2016  . Elevated BP without diagnosis of hypertension 09/27/2015  . Protein S deficiency (HCC) 07/13/2013  . 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 Gyn surgery    . Lumps in rt breast    . RHINOPLASTY    . SHOULDER ARTHROSCOPY WITH ROTATOR CUFF REPAIR Right 04/22/2018   Procedure: Right shoulder arthroscopy with subacromial decompression, distal clavicle resection, rotator cuff repair;  Surgeon: Francena HanlySupple, Kevin, MD;  Location: [facility] OR;  Service: Orthopedics;  Laterality: Right;  120min     OB History   No obstetric history on file.      Home Medications    Prior to Admission medications   Medication Sig Start Date End Date Taking? Authorizing Provider  COPPER PO 1 Device by Intrauterine route once.   12/26/16   [provider]  EMGALITY 120 MG/ML SOAJ Inject 120 mg into the skin every 28 (twenty-eight) days. 03/28/18   [provider]    Family History Family History  Problem Relation Age of Onset  . Hypertension Father     Social History Social History   Tobacco Use  . Smoking status: Never Smoker  . Smokeless tobacco: Never Used  Substance Use Topics  . Alcohol use: No  . Drug use: No     Allergies   Sumatriptan; Cinnamon; Codeine; and Latex   Review of Systems Review of Systems  Constitutional: Negative for chills, fatigue and fever.  HENT: Positive for ear pain. Negative for congestion and rhinorrhea.   Eyes: Positive for photophobia. Negative for visual disturbance.  Respiratory: Negative for cough, chest tightness and shortness of breath.   Cardiovascular: Negative for chest pain.  Gastrointestinal: Positive for nausea and vomiting. Negative for abdominal pain, constipation and diarrhea.  Genitourinary: Negative for dysuria.  Musculoskeletal: Negative for back pain, neck pain and neck stiffness.  Skin: Negative for rash and wound.  Neurological: Positive for headaches. Negative for dizziness, seizures, weakness, light-headedness and numbness.  Psychiatric/Behavioral: Negative for agitation.     Physical Exam Updated Vital Signs BP (!) 164/107 (BP Location: Right Arm)   Pulse 61   Temp 97.8 F (36.6 C) (Oral)   Resp 16   Ht 5\' 2"  (1.575 m)   Wt 63.5 kg   LMP 08/12/2018   SpO2 100%  BMI 25.61 kg/m   Physical Exam Vitals signs and nursing note reviewed.  Constitutional:      General: She is not in acute distress.    Appearance: Normal appearance. She is well-developed and normal weight. She is not toxic-appearing or diaphoretic.  HENT:     Head: Normocephalic and atraumatic.     Right Ear: External ear normal.     Left Ear: External ear normal.     Nose: Nose normal. No congestion or rhinorrhea.     Mouth/Throat:     Mouth:  Mucous membranes are moist.     Pharynx: No oropharyngeal exudate.  Eyes:     Conjunctiva/sclera: Conjunctivae normal.     Pupils: Pupils are equal, round, and reactive to light.  Neck:     Musculoskeletal: Normal range of motion and neck supple. No neck rigidity or muscular tenderness.     Vascular: No carotid bruit.  Cardiovascular:     Rate and Rhythm: Normal rate.     Pulses: Normal pulses.     Heart sounds: Normal heart sounds. No murmur.  Pulmonary:     Effort: No respiratory distress.     Breath sounds: No stridor.  Abdominal:     General: There is no distension.     Tenderness: There is no abdominal tenderness. There is no rebound.  Musculoskeletal:        General: No tenderness.  Lymphadenopathy:     Cervical: No cervical adenopathy.  Skin:    General: Skin is warm.     Capillary Refill: Capillary refill takes less than 2 seconds.     Findings: No erythema or rash.  Neurological:     General: No focal deficit present.     Mental Status: She is alert and oriented to person, place, and time.     Cranial Nerves: No cranial nerve deficit.     Sensory: No sensory deficit.     Motor: No weakness or abnormal muscle tone.     Coordination: Coordination normal.     Deep Tendon Reflexes: Reflexes are normal and symmetric.  Psychiatric:        Mood and Affect: Mood normal.      ED Treatments / Results  Labs (all labs ordered are listed, but only abnormal results are displayed) Labs Reviewed - No data to display  EKG None  Radiology No results found.  Procedures Procedures (including critical care time)  Medications Ordered in ED Medications - No data to display   Initial Impression / Assessment and Plan / ED Course  I have reviewed the triage vital signs and the nursing notes.  Pertinent labs & imaging results that were available during my care of the patient were reviewed by me and considered in my medical decision making (see chart for details).      Susan ScheuermannLeah B Speir is a 42 y.o. female with a past medical history significant for migraines, seizures, and prior ectopic pregnancy who presents with headache.  Patient reports that since yesterday she was having severe migraines.  She reports photophobia, phonophobia, nausea, and vomiting.  She denies vision changes.  She reports this feels "just like a severe migraine ".  She reports that she tried Imitrex at home and unfortunately, it did not help her symptoms significantly.  She says last time this happened, she had to come to this emergency department to get a "headache cocktail".  Patient reports no recent trauma, no fevers, no chills.  She reported some mild bilateral ear aching  but denied any neck stiffness.  She did report some mild neck aching bilaterally.  Patient denies congestion, cough, rhinorrhea, abdominal pain, urinary symptoms or constipation/diarrhea.  On exam, patient has normal pupil exam with normal extraocular movements.  Normal sensation throughout.  Normal strength and sensation in all extremities.  No nuchal rigidity.  Lungs clear chest is nontender.  Abdomen nontender.  Ears showed no evidence of otitis media bilaterally.  Based on her reported similar symptoms and her exam, I suspect she has recurrent migraine.  Patient requested the same medications be given from her last visit which helped significantly.  Chart review shows she received Reglan, Benadryl, Toradol, Decadron, and fluids.  These were ordered.  Low suspicion for meningitis based on report and history.  Anticipate discharge if patient is feeling better.  9:57 PM Patient reports her headache is resolved after cocktail.  She wants to go home.  Given resolution of headache, patient will be discharged.  Patient will follow-up with PCP and understood return precautions.  Patient discharged in good condition.      Final Clinical Impressions(s) / ED Diagnoses   Final diagnoses:  Migraine without status migrainosus,  not intractable, unspecified migraine type    ED Discharge Orders    None      Clinical Impression: 1. Migraine without status migrainosus, not intractable, unspecified migraine type     Disposition: Discharge  Condition: Good  I have discussed the results, Dx and Tx plan with the pt(& family if present). He/she/they expressed understanding and agree(s) with the plan. Discharge instructions discussed at great length. Strict return precautions discussed and pt &/or family have verbalized understanding of the instructions. No further questions at time of discharge.    New Prescriptions   No medications on file    Follow Up: [facility] [address] [place] [address] [phone] Schedule an appointment as soon as possible for a visit    [facility] [place] [facility] [address] [phone] [address] [place] [address] [phone]       Tegeler, Canary Brim, MD 08/18/18 2200

## 2018-08-18 NOTE — ED Triage Notes (Signed)
Migraine with vomiting since yesterday.  Imitrex not working.  Sts this is the worst one she has ever had.  If Imitrex does not work she sts she needs the "migraine cocktail."

## 2018-08-18 NOTE — Discharge Instructions (Signed)
Your history and exam today were consistent with migraine headache.  As you felt better after medications, we feel you are safe for discharge home.  Please follow-up with your primary doctor and stay hydrated.  If any symptoms change or worsen, please return to the nearest emergency department.

## 2018-08-18 NOTE — ED Notes (Signed)
Pt ambulatory to bathroom, states "I feel much better, can I go home?" MD made aware.

## 2020-01-25 DIAGNOSIS — R569 Unspecified convulsions: Secondary | ICD-10-CM

## 2020-03-18 ENCOUNTER — Emergency Department (HOSPITAL_BASED_OUTPATIENT_CLINIC_OR_DEPARTMENT_OTHER): Payer: Medicaid Other

## 2020-03-18 ENCOUNTER — Emergency Department (HOSPITAL_BASED_OUTPATIENT_CLINIC_OR_DEPARTMENT_OTHER)
Admission: EM | Admit: 2020-03-18 | Discharge: 2020-03-18 | Disposition: A | Discharge status: Home / Self Care | Payer: Medicaid Other | Attending: Emergency Medicine | Admitting: Emergency Medicine

## 2020-03-18 ENCOUNTER — Other Ambulatory Visit: Payer: Self-pay

## 2020-03-18 ENCOUNTER — Encounter (HOSPITAL_BASED_OUTPATIENT_CLINIC_OR_DEPARTMENT_OTHER): Payer: Self-pay | Admitting: Emergency Medicine

## 2020-03-18 DIAGNOSIS — S93601A Unspecified sprain of right foot, initial encounter: Secondary | ICD-10-CM | POA: Insufficient documentation

## 2020-03-18 DIAGNOSIS — W228XXA Striking against or struck by other objects, initial encounter: Secondary | ICD-10-CM | POA: Insufficient documentation

## 2020-03-18 DIAGNOSIS — Y999 Unspecified external cause status: Secondary | ICD-10-CM | POA: Diagnosis not present

## 2020-03-18 DIAGNOSIS — Y929 Unspecified place or not applicable: Secondary | ICD-10-CM | POA: Diagnosis not present

## 2020-03-18 DIAGNOSIS — Y9389 Activity, other specified: Secondary | ICD-10-CM | POA: Diagnosis not present

## 2020-03-18 DIAGNOSIS — S99921A Unspecified injury of right foot, initial encounter: Secondary | ICD-10-CM | POA: Diagnosis present

## 2020-03-18 NOTE — ED Notes (Signed)
Pt seen and assessed by the PA in triage room 1  - pt d/c's with crutches and ortho shoe

## 2020-03-18 NOTE — ED Triage Notes (Signed)
Patient states that she was kicking something out of the way when she heard a " crunch" and started to have pain to her right foot. The patient walks to triage with a limp

## 2020-03-18 NOTE — ED Provider Notes (Signed)
[facility] EMERGENCY DEPARTMENT Provider Note   CSN: [id number] Arrival date & time: 03/18/20  1801     History Chief Complaint  Patient presents with  . Foot Injury    Susan Mcpherson is a 44 y.o. female.  44 year old female with complaint of right foot pain.  Patient states that she went to kick laundry on the stairs this evening and hyperflexed her right great toe resulting in pain in the right great toe as well as along the lateral aspect of the right foot.  She has been unable to bear weight without significant pain in the foot since this happened.  Prior history of right foot injury in September of last year.  No other injuries or concerns.        Past Medical History:  Diagnosis Date  . Ectopic pregnancy   . Migraine   . Seizures (HCC)    not on any medications per MD, last seizure was sometime in 2017    Patient Active Problem List   Diagnosis Date Noted  . Right shoulder pain 03/29/2018  . IUD (intrauterine device) in place 12/26/2016  . Migraine without aura and without status migrainosus, not intractable 10/08/2016  . Elevated BP without diagnosis of hypertension 09/27/2015  . Protein S deficiency (HCC) 07/13/2013  . 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 Gyn surgery    . Lumps in rt breast    . RHINOPLASTY    . SHOULDER ARTHROSCOPY WITH ROTATOR CUFF REPAIR Right 04/22/2018   Procedure: Right shoulder arthroscopy with subacromial decompression, distal clavicle resection, rotator cuff repair;  Surgeon: Francena Hanly, MD;  Location: [facility] OR;  Service: Orthopedics;  Laterality: Right;      OB History   No obstetric history on file.     Family History  Problem Relation Age of Onset  . Hypertension Father     Social History   Tobacco Use  . Smoking status: Never Smoker  . Smokeless tobacco: Never Used  Vaping Use  . Vaping Use: Never used  Substance Use Topics  . Alcohol use: No  .  Drug use: No    Home Medications Prior to Admission medications   Medication Sig Start Date End Date Taking? Authorizing Provider  COPPER PO 1 Device by Intrauterine route once.  12/26/16   [provider]  EMGALITY 120 MG/ML SOAJ Inject 120 mg into the skin every 28 (twenty-eight) days. 03/28/18   [provider]    Allergies    Sumatriptan, Cinnamon, Codeine, and Latex  Review of Systems   Review of Systems  Constitutional: Negative for fever.  Musculoskeletal: Positive for arthralgias and myalgias. Negative for joint swelling.  Skin: Negative for rash and wound.  Neurological: Negative for weakness and numbness.    Physical Exam Updated Vital Signs BP 121/65 (BP Location: Left Arm)   Pulse 68   Temp 98.5 F (36.9 C)   Resp 18   Ht 5\' 1"  (1.549 m)   Wt 74.8 kg   LMP 03/06/2020   SpO2 100%   BMI 31.18 kg/m   Physical Exam Vitals and nursing note reviewed.  Constitutional:      General: She is not in acute distress.    Appearance: She is well-developed. She is not diaphoretic.  HENT:     Head: Normocephalic and atraumatic.  Cardiovascular:     Pulses: Normal pulses.  Pulmonary:     Effort: Pulmonary effort is normal.  Musculoskeletal:        General: Tenderness present. No swelling or deformity.       Legs:     Comments: Tenderness to right great toe as well as lateral aspect of right foot.,  Slight ecchymosis to medial aspect of right great toe.  Skin:    General: Skin is warm and dry.     Capillary Refill: Capillary refill takes less than 2 seconds.     Findings: No erythema or rash.  Neurological:     Mental Status: She is alert and oriented to person, place, and time.     Sensory: No sensory deficit.  Psychiatric:        Behavior: Behavior normal.     ED Results / Procedures / Treatments   Labs (all labs ordered are listed, but only abnormal results are displayed) Labs Reviewed - No data to display  EKG None  Radiology DG Foot  Complete Right  Result Date: 03/18/2020 CLINICAL DATA:  44 year old female with trauma to the right foot. EXAM: RIGHT FOOT COMPLETE - 3+ VIEW COMPARISON:  None. FINDINGS: There is no evidence of fracture or dislocation. There is no evidence of arthropathy or other focal bone abnormality. Soft tissues are unremarkable. IMPRESSION: Negative. Electronically Signed   By: Elgie Collard M.D.   On: 03/18/2020 18:35    Procedures Procedures (including critical care time)  Medications Ordered in ED Medications - No data to display  ED Course  I have reviewed the triage vital signs and the nursing notes.  Pertinent labs & imaging results that were available during my care of the patient were reviewed by me and considered in my medical decision making (see chart for details).  Clinical Course as of Mar 18 2130  Sun Mar 18, 2020  1035 44 year old female with right foot injury.  Patient has tenderness to the right great toe and lateral aspect of the right foot, no ankle pain.  X-ray is negative for acute bony injury.  Patient was placed in a postop shoe and given crutches for comfort, recommend weight-bear as tolerated, Motrin, Tylenol, ice and elevate and follow-up with her orthopedist.   [LM]    Clinical Course User Index [LM] Alden Hipp   MDM Rules/Calculators/A&P                          Final Clinical Impression(s) / ED Diagnoses Final diagnoses:  Sprain of right foot, initial encounter    Rx / DC Orders ED Discharge Orders    None       Jeannie Fend, PA-C 03/18/20 2131    Melene Plan, DO 03/18/20 2158

## 2020-03-18 NOTE — Discharge Instructions (Addendum)
Follow-up with your orthopedist. Take Motrin and Tylenol as needed as directed.  You can apply ice to the foot for 20 minutes at a time and elevate to help with pain. Use crutches and weight-bear as tolerated.

## 2021-03-03 ENCOUNTER — Ambulatory Visit: Payer: Self-pay

## 2021-05-11 ENCOUNTER — Emergency Department (HOSPITAL_BASED_OUTPATIENT_CLINIC_OR_DEPARTMENT_OTHER): Payer: Medicaid Other

## 2021-05-11 ENCOUNTER — Encounter (HOSPITAL_BASED_OUTPATIENT_CLINIC_OR_DEPARTMENT_OTHER): Payer: Self-pay | Admitting: Emergency Medicine

## 2021-05-11 ENCOUNTER — Other Ambulatory Visit: Payer: Self-pay

## 2021-05-11 ENCOUNTER — Emergency Department (HOSPITAL_BASED_OUTPATIENT_CLINIC_OR_DEPARTMENT_OTHER)
Admission: EM | Admit: 2021-05-11 | Discharge: 2021-05-11 | Disposition: A | Discharge status: Home / Self Care | Payer: Medicaid Other | Attending: Emergency Medicine | Admitting: Emergency Medicine

## 2021-05-11 DIAGNOSIS — Z91040 Latex allergy status: Secondary | ICD-10-CM | POA: Insufficient documentation

## 2021-05-11 DIAGNOSIS — S0990XA Unspecified injury of head, initial encounter: Secondary | ICD-10-CM | POA: Diagnosis present

## 2021-05-11 DIAGNOSIS — S060X0A Concussion without loss of consciousness, initial encounter: Secondary | ICD-10-CM | POA: Diagnosis not present

## 2021-05-11 DIAGNOSIS — Y9364 Activity, baseball: Secondary | ICD-10-CM | POA: Diagnosis not present

## 2021-05-11 DIAGNOSIS — W228XXA Striking against or struck by other objects, initial encounter: Secondary | ICD-10-CM | POA: Diagnosis not present

## 2021-05-11 DIAGNOSIS — S0083XA Contusion of other part of head, initial encounter: Secondary | ICD-10-CM | POA: Insufficient documentation

## 2021-05-11 MED ORDER — OXYCODONE-ACETAMINOPHEN 5-325 MG PO TABS: 1.0000 | ORAL_TABLET | Freq: Four times a day (QID) | ORAL | 0 refills | Status: DC | PRN

## 2021-05-11 MED ORDER — IBUPROFEN 400 MG PO TABS
600.0000 mg | ORAL_TABLET | Freq: Once | ORAL | Status: AC
  Administered 2021-05-11: 600 mg via ORAL
  Filled 2021-05-11: qty 1

## 2021-05-11 MED ORDER — ONDANSETRON 4 MG PO TBDP: 4.0000 mg | ORAL_TABLET | Freq: Three times a day (TID) | ORAL | 0 refills | Status: DC | PRN

## 2021-05-11 MED ORDER — OXYCODONE-ACETAMINOPHEN 5-325 MG PO TABS
1.0000 | ORAL_TABLET | Freq: Once | ORAL | Status: AC
  Administered 2021-05-11: 1 via ORAL
  Filled 2021-05-11: qty 1

## 2021-05-11 MED ORDER — ONDANSETRON 4 MG PO TBDP
4.0000 mg | ORAL_TABLET | Freq: Once | ORAL | Status: AC
  Administered 2021-05-11: 4 mg via ORAL
  Filled 2021-05-11: qty 1

## 2021-05-11 NOTE — Discharge Instructions (Signed)
Please read and follow all provided instructions.  Your diagnoses today include:  1. Concussion without loss of consciousness, initial encounter   2. Contusion of face, initial encounter   3. Injury of head, initial encounter     Tests performed today include: CT scan of your head that did not show any serious injury. Vital signs. See below for your results today.   Medications prescribed:  Percocet (oxycodone/acetaminophen) - narcotic pain medication  DO NOT drive or perform any activities that require you to be awake and alert because this medicine can make you drowsy. BE VERY CAREFUL not to take multiple medicines containing Tylenol (also called acetaminophen). Doing so can lead to an overdose which can damage your liver and cause liver failure and possibly death.  Zofran (ondansetron) - for nausea and vomiting  Take any prescribed medications only as directed.  Home care instructions:  Follow any educational materials contained in this packet.  BE VERY CAREFUL not to take multiple medicines containing Tylenol (also called acetaminophen). Doing so can lead to an overdose which can damage your liver and cause liver failure and possibly death.   Follow-up instructions: Please follow-up with your primary care provider or the concussion clinic listed in the next 3 days for further evaluation of your symptoms if not improving.   Return instructions:  SEEK IMMEDIATE MEDICAL ATTENTION IF: There is confusion or drowsiness (although children frequently become drowsy after injury).  You cannot awaken the injured person.  You have more than one episode of vomiting.  You notice dizziness or unsteadiness which is getting worse, or inability to walk.  You have convulsions or unconsciousness.  You experience severe, persistent headaches not relieved by Tylenol. You cannot use arms or legs normally.  There are changes in pupil sizes. (This is the black center in the colored part of the eye)   There is clear or bloody discharge from the nose or ears.  You have change in speech, vision, swallowing, or understanding.  Localized weakness, numbness, tingling, or change in bowel or bladder control. You have any other emergent concerns.  Additional Information: You have had a head injury which does not appear to require admission at this time.  Your vital signs today were: BP (!) 162/95 (BP Location: Right Arm)   Pulse 60   Temp 98 F (36.7 C) (Oral)   Resp 18   Ht 5\' 1"  (1.549 m)   Wt 69.4 kg   LMP 04/10/2021   SpO2 100%   BMI 28.91 kg/m  If your blood pressure (BP) was elevated above 135/85 this visit, please have this repeated by your doctor within one month. --------------

## 2021-05-11 NOTE — ED Notes (Signed)
Discharged by [employer] RN

## 2021-05-11 NOTE — ED Triage Notes (Signed)
Pt reports she was hit  on LT side of head with baseball at her son's birthday party at 1400; she is not sure if ball was thrown or hit; c/o HA, "everything is fuzzy" and "I feel drunk"; pt amb to triage with minimal assistance

## 2021-05-11 NOTE — ED Provider Notes (Signed)
[facility] EMERGENCY DEPARTMENT Provider Note   CSN: [id number] Arrival date & time: 05/11/21  2050     History Chief Complaint  Patient presents with   Headache   Head Injury    Susan Mcpherson is a 45 y.o. female.  Patient presents the emergency department for head and facial pain after an injury today.  Patient was hit in the side of the head, just above the left ear, by a baseball.  She did not lose consciousness but was dazed afterwards.  She treated at home with Aleve.  This evening she continued to have a bad headache, significant left-sided facial pain.  She has some dizziness and imbalance sensation with walking.  No vomiting or confusion.  No weakness, numbness, or tingling in the arms of the legs.  Headache is worse on the left side around where she was hit.  Patient is able to ambulate independently but does need some assistance to help keep her steady.  No focal neck pain but she does have stiffness with movement.  No other injuries.  Onset of symptoms acute.  Movement makes the pain worse.  Nothing makes it better.      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     Family History  Problem Relation Age of Onset   Hypertension Father     Social History   Tobacco Use   Smoking status: Never   Smokeless tobacco: Never  Vaping Use   Vaping Use: Never used  Substance Use Topics   Alcohol use: No   Drug use: No    Home Medications Prior to Admission medications   Medication Sig Start Date End Date Taking? Authorizing Provider  ondansetron (ZOFRAN ODT) 4 MG disintegrating tablet Take 1 tablet (4 mg total) by mouth every 8 (eight) hours as needed for nausea or vomiting. 05/11/21  Yes Renne Crigler, PA-C  oxyCODONE-acetaminophen (PERCOCET/ROXICET) 5-325 MG tablet Take 1 tablet by mouth every 6 (six) hours as needed for severe pain. 05/11/21  Yes Renne Crigler, PA-C  COPPER PO 1 Device by Intrauterine route once.  12/26/16   [provider]  EMGALITY 120 MG/ML SOAJ Inject 120 mg into the skin every 28 (twenty-eight) days. 03/28/18   [provider]    Allergies    Sumatriptan, Cinnamon, Codeine, and Latex  Review of Systems   Review of Systems  Constitutional:  Negative for fatigue.  HENT:  Positive for facial swelling. Negative for tinnitus.   Eyes:  Positive for  photophobia. Negative for pain and visual disturbance.  Respiratory:  Negative for shortness of breath.   Cardiovascular:  Negative for chest pain.  Gastrointestinal:  Positive for nausea. Negative for vomiting.  Musculoskeletal:  Positive for neck pain. Negative for back pain and gait problem.  Skin:  Negative for wound.  Neurological:  Positive for dizziness and headaches. Negative for weakness, light-headedness and numbness.  Psychiatric/Behavioral:  Negative for confusion and decreased concentration.    Physical Exam Updated Vital Signs BP (!) 162/95 (BP Location: Right Arm)   Pulse 60   Temp 98 F (36.7 C) (Oral)   Resp 18   Ht 5\' 1"  (1.549 m)   Wt  69.4 kg   LMP 04/10/2021   SpO2 100%   BMI 28.91 kg/m   Physical Exam Vitals and nursing note reviewed.  Constitutional:      Appearance: She is well-developed.  HENT:     Head: Normocephalic. No raccoon eyes or Battle's sign.     Jaw: There is normal jaw occlusion. Tenderness and pain on movement present. No trismus, swelling or malocclusion.      Comments: Patient struck just above the left ear.  No deformities or swelling noted.  She is tender in this area and about the TMJ posterior to the ear.  She has mild pain over the left zygoma without deformity.  Dentition appears normal.    Right Ear: Tympanic membrane, ear canal and external ear normal. No hemotympanum.     Left Ear: Tympanic membrane, ear canal and external ear normal. No hemotympanum.     Nose: Nose normal.     Mouth/Throat:     Mouth: Mucous membranes are moist.     Pharynx: Uvula midline.  Eyes:     General: Lids are normal.     Extraocular Movements:     Right eye: No nystagmus.     Left eye: No nystagmus.     Conjunctiva/sclera: Conjunctivae normal.     Pupils: Pupils are equal, round, and reactive to light.     Comments: No visible hyphema noted  Cardiovascular:     Rate and Rhythm: Normal rate and regular rhythm.  Pulmonary:     Effort: Pulmonary effort is normal.     Breath sounds: Normal breath sounds.  Abdominal:     Palpations: Abdomen is soft.     Tenderness: There is no abdominal tenderness.  Musculoskeletal:     Cervical back: Normal range of motion and neck supple. No tenderness or bony tenderness.     Thoracic back: No tenderness or bony tenderness.     Lumbar back: No tenderness or bony tenderness.  Skin:    General: Skin is warm and dry.  Neurological:     Mental Status: She is alert and oriented to person, place, and time.     GCS: GCS eye subscore is 4. GCS verbal subscore is 5. GCS motor subscore is 6.     Cranial Nerves: No cranial nerve deficit.     Sensory: No sensory deficit.      Coordination: Coordination normal.     Deep Tendon Reflexes: Reflexes are normal and symmetric.    ED Results / Procedures / Treatments   Labs (all labs ordered are listed, but only abnormal results are displayed) Labs Reviewed - No data to display  EKG None  Radiology CT HEAD WO CONTRAST (04/12/2021)  Result Date: 05/11/2021 CLINICAL DATA:  Hit left side of head with baseball her [place] birthday party. "everything is fuzzy"  and "I feel drunk"; pt amb to triage with minimal assistance EXAM: CT HEAD WITHOUT CONTRAST TECHNIQUE: Contiguous axial images were obtained from the base of the skull through the vertex without intravenous contrast. COMPARISON:  None. FINDINGS: Brain: No evidence of large-territorial acute infarction. No parenchymal hemorrhage. No mass lesion. No extra-axial collection. No mass effect or midline shift. No hydrocephalus. Basilar cisterns are patent. Vascular: No hyperdense vessel. Skull: No acute fracture or focal lesion. Sinuses/Orbits: Paranasal sinuses and mastoid air cells are clear. The orbits are unremarkable. Other: None. IMPRESSION: No acute intracranial abnormality. Electronically Signed   By: Tish Frederickson M.D.   On: 05/11/2021 21:58    Procedures Procedures   Medications Ordered in ED Medications  oxyCODONE-acetaminophen (PERCOCET/ROXICET) 5-325 MG per tablet 1 tablet (1 tablet Oral Given 05/11/21 2259)  ibuprofen (ADVIL) tablet 600 mg (600 mg Oral Given 05/11/21 2259)  ondansetron (ZOFRAN-ODT) disintegrating tablet 4 mg (4 mg Oral Given 05/11/21 2301)    ED Course  I have reviewed the triage vital signs and the nursing notes.  Pertinent labs & imaging results that were available during my care of the patient were reviewed by me and considered in my medical decision making (see chart for details).  Patient seen and examined.  CT performed prior to exam and was negative.  I reviewed this personally to ensure that the areas where patient was having bony pain was  visualized.  They were and I do not feel that the patient requires additional imaging with CT maxillofacial protocol.  Patient appears to have had a concussion.  We discussed signs and symptoms of this and treatment.  We discussed cognitive rest.  She will be given medication for pain control and headache.  She may use Tylenol at home as well as ibuprofen if desired.  Will give prescription for Zofran given nausea.  Referral to [place] [facility] concussion [facility].  Patient and family verbalized understanding agree with plan.  Patient counseled on use of narcotic pain medications. Counseled not to combine these medications with others containing tylenol. Urged not to drink alcohol, drive, or perform any other activities that requires focus while taking these medications. The patient verbalizes understanding and agrees with the plan.  Vital signs reviewed and are as follows: BP (!) 162/95 (BP Location: Right Arm)   Pulse 60   Temp 98 F (36.7 C) (Oral)   Resp 18   Ht 5\' 1"  (1.549 m)   Wt 69.4 kg   LMP 04/10/2021   SpO2 100%   BMI 28.91 kg/m      MDM Rules/Calculators/A&P                           Patient here with concussion symptoms after being struck in the head by a baseball.  Head CT is negative.  No fractures.  Patient with tenderness over the jaw and cheek, however no visible fractures on CT imaging.  No signs of neurologic compromise.  No malocclusion or other findings to suggest significant jaw fracture.  Suspect contusion at this point.  Patient will need to monitor closely and return with worsening as above.    Final Clinical Impression(s) / ED Diagnoses Final diagnoses:  Concussion without loss of consciousness, initial encounter  Contusion of face, initial encounter  Injury of head, initial encounter    Rx / DC Orders ED Discharge Orders          Ordered    oxyCODONE-acetaminophen (PERCOCET/ROXICET) 5-325 MG  tablet  Every 6 hours PRN        05/11/21 2327     ondansetron (ZOFRAN ODT) 4 MG disintegrating tablet  Every 8 hours PRN        05/11/21 2327             Renne Crigler, PA-C 05/11/21 2350    Terrilee Files, MD 05/12/21 1028

## 2021-05-13 ENCOUNTER — Emergency Department (HOSPITAL_BASED_OUTPATIENT_CLINIC_OR_DEPARTMENT_OTHER)
Admission: EM | Admit: 2021-05-13 | Discharge: 2021-05-13 | Disposition: A | Discharge status: Home / Self Care | Payer: Medicaid Other | Attending: Emergency Medicine | Admitting: Emergency Medicine

## 2021-05-13 ENCOUNTER — Other Ambulatory Visit: Payer: Self-pay

## 2021-05-13 ENCOUNTER — Encounter (HOSPITAL_BASED_OUTPATIENT_CLINIC_OR_DEPARTMENT_OTHER): Payer: Self-pay | Admitting: Emergency Medicine

## 2021-05-13 DIAGNOSIS — W2103XD Struck by baseball, subsequent encounter: Secondary | ICD-10-CM | POA: Diagnosis not present

## 2021-05-13 DIAGNOSIS — Z91040 Latex allergy status: Secondary | ICD-10-CM | POA: Diagnosis not present

## 2021-05-13 DIAGNOSIS — H9202 Otalgia, left ear: Secondary | ICD-10-CM | POA: Diagnosis not present

## 2021-05-13 DIAGNOSIS — S060X0D Concussion without loss of consciousness, subsequent encounter: Secondary | ICD-10-CM | POA: Diagnosis not present

## 2021-05-13 DIAGNOSIS — S0990XD Unspecified injury of head, subsequent encounter: Secondary | ICD-10-CM | POA: Diagnosis present

## 2021-05-13 MED ORDER — MECLIZINE HCL 25 MG PO TABS: 25.0000 mg | ORAL_TABLET | Freq: Three times a day (TID) | ORAL | 0 refills | Status: DC | PRN

## 2021-05-13 MED ORDER — ONDANSETRON 4 MG PO TBDP
4.0000 mg | ORAL_TABLET | Freq: Once | ORAL | Status: AC
  Administered 2021-05-13: 4 mg via ORAL
  Filled 2021-05-13: qty 1

## 2021-05-13 MED ORDER — MECLIZINE HCL 25 MG PO TABS
25.0000 mg | ORAL_TABLET | Freq: Once | ORAL | Status: AC
  Administered 2021-05-13: 25 mg via ORAL
  Filled 2021-05-13: qty 1

## 2021-05-13 NOTE — Discharge Instructions (Addendum)
You were seen in the emergency department today after being hit in the head by a baseball on Saturday.  You were also seen in the emergency department on Saturday for similar symptoms.  At this time there is no worrisome finding that would require [place] to scan your head again since Saturday.  It is likely that you are having ongoing concussion symptoms.  The provider on Saturday provided you with pain medication and nausea medication which you should continue to take for symptomatic relief.  They also sent a referral to the concussion clinic.  Please call them tomorrow and set up an appointment.  You have a work note from your visit on Saturday that goes until Wednesday.  I have also prescribed a medication called meclizine which she can use for dizziness which may help with some of your symptoms.  Continue with cognitive rest until going back to work.  We also discussed you avoiding activities that exacerbate your symptoms.  Please return to the emergency department for any reason.

## 2021-05-13 NOTE — ED Provider Notes (Signed)
[facility] EMERGENCY DEPARTMENT Provider Note   CSN: [id number] Arrival date & time: 05/13/21  1442     History Chief Complaint  Patient presents with   Dizziness    Susan Mcpherson is a 45 y.o. female.  With past medical history of migraines, recent visit to the emergency department after being struck with a baseball who presents emergency department with dizziness and pain.  Describes the pain primarily in her left ear since Saturday, describes it as constant, stabbing, 10/10.  She also describes pain behind the ear and in front of the ear.  Denies any discharge from the ear.  Denies facial swelling.  Endorses nausea without vomiting.  Also states that she has been intermittently dizzy, fatigued and "sleeping all the time."  She states that her daughter said that she was talking, " crazy," since Saturday intermittently.  He states that her symptoms have been getting worse since Saturday.  Endorses using medication she was prescribed.  States she has not followed up with concussion clinic.  Denies vomiting, numbness or tingling in the arms or legs, weakness, fevers.  Of note she was seen on Saturday, 05/11/2021 after being hit in the side of the head, just above the ear by a baseball.  She did not lose consciousness during the event but was dazed afterwards.  During emergency department visit had CT head without contrast which was unremarkable.  Decision was made to not obtain CT maxillofacial.  She was prescribed oxycodone, Zofran and instructed to follow-up with concussion clinic.   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     Family History  Problem Relation Age of Onset   Hypertension Father     Social History   Tobacco Use   Smoking status: Never   Smokeless tobacco: Never  Vaping Use   Vaping Use: Never used  Substance Use Topics   Alcohol use: No   Drug use: No    Home Medications Prior to Admission medications   Medication Sig Start Date End Date Taking? Authorizing Provider  COPPER PO 1 Device by Intrauterine route once.  12/26/16   [provider]  EMGALITY 120 MG/ML SOAJ Inject 120 mg into the skin every 28 (twenty-eight) days. 03/28/18   [provider]  ondansetron (ZOFRAN ODT) 4 MG disintegrating tablet Take 1 tablet (4 mg total) by mouth every 8 (eight) hours as needed for nausea or  vomiting. 05/11/21   Renne Crigler, PA-C  oxyCODONE-acetaminophen (PERCOCET/ROXICET) 5-325 MG tablet Take 1 tablet by mouth every 6 (six) hours as needed for severe pain. 05/11/21   Renne Crigler, PA-C    Allergies    Sumatriptan, Cinnamon, Codeine, and Latex  Review of Systems   Review of Systems  Constitutional:  Positive for fatigue. Negative for fever.  HENT:  Positive for ear pain. Negative for ear discharge.   Musculoskeletal:  Negative for neck pain and neck stiffness.  Neurological:  Positive for dizziness and headaches. Negative for  seizures, syncope and weakness.  Psychiatric/Behavioral:  Positive for confusion.   All other systems reviewed and are negative.  Physical Exam Updated Vital Signs BP (!) 150/95 (BP Location: Left Arm)   Pulse 69   Temp 98.4 F (36.9 C) (Oral)   Resp 16   Ht 5\' 1"  (1.549 m)   Wt 69.4 kg   SpO2 99%   BMI 28.91 kg/m   Physical Exam Vitals and nursing note reviewed.  Constitutional:      General: She is not in acute distress.    Appearance: Normal appearance. She is not toxic-appearing.  HENT:     Head: Normocephalic and atraumatic. No Battle's sign, contusion or left periorbital erythema.      Right Ear: Tympanic membrane, ear canal and external ear normal. Tenderness present. There is no impacted cerumen. Tympanic membrane is not perforated or erythematous.     Left Ear: Tympanic membrane, ear canal and external ear normal. There is no impacted cerumen.     Ears:     Comments: Pain with pulling of the left earlobe.  Tenderness to palpation just posterior to the ear around the mastoid.  There is no mastoid erythema, swelling.  She has tenderness to palpation near the tragus and the face just in front of the left ear.  There is no swelling, redness, bony crepitus.  The ear canal is without erythema or swelling.  TM is intact with good cone of light. Eyes:     General: No scleral icterus.    Extraocular Movements: Extraocular movements intact.     Conjunctiva/sclera: Conjunctivae normal.     Pupils: Pupils are equal, round, and reactive to light.  Pulmonary:     Effort: Pulmonary effort is normal. No respiratory distress.  Musculoskeletal:        General: Normal range of motion.     Cervical back: Normal range of motion and neck supple.  Skin:    General: Skin is warm and dry.     Capillary Refill: Capillary refill takes less than 2 seconds.     Findings: No rash.  Neurological:     General: No focal deficit present.     Mental Status: She is alert and oriented to person,  place, and time. Mental status is at baseline.     Cranial Nerves: No cranial nerve deficit.     Gait: Gait normal.  Psychiatric:        Mood and Affect: Mood normal.        Behavior: Behavior normal.        Thought Content: Thought content normal.        Judgment: Judgment normal.    ED Results / Procedures / Treatments   Labs (all labs ordered are listed, but only abnormal results are displayed) Labs Reviewed - No data to display  EKG None  Radiology CT HEAD WO CONTRAST ( )  Result Date: 05/11/2021 CLINICAL DATA:  Hit left side  of head with baseball her [place] birthday party. "everything is fuzzy" and "I feel drunk"; pt amb to triage with minimal assistance EXAM: CT HEAD WITHOUT CONTRAST TECHNIQUE: Contiguous axial images were obtained from the base of the skull through the vertex without intravenous contrast. COMPARISON:  None. FINDINGS: Brain: No evidence of large-territorial acute infarction. No parenchymal hemorrhage. No mass lesion. No extra-axial collection. No mass effect or midline shift. No hydrocephalus. Basilar cisterns are patent. Vascular: No hyperdense vessel. Skull: No acute fracture or focal lesion. Sinuses/Orbits: Paranasal sinuses and mastoid air cells are clear. The orbits are unremarkable. Other: None. IMPRESSION: No acute intracranial abnormality. Electronically Signed   By: Tish Frederickson M.D.   On: 05/11/2021 21:58    Procedures Procedures   Medications Ordered in ED Medications  meclizine (ANTIVERT) tablet 25 mg (has no administration in time range)  ondansetron (ZOFRAN-ODT) disintegrating tablet 4 mg (4 mg Oral Given 05/13/21 1637)    ED Course  I have reviewed the triage vital signs and the nursing notes.  Pertinent labs & imaging results that were available during my care of the patient were reviewed by me and considered in my medical decision making (see chart for details).    MDM Rules/Calculators/A&P                        45 year old female  who presents emergency department with HPI, physical exam as described above.  She has no new focal neurological findings or symptoms concerning for intracranial hemorrhage.  There is no bony crepitus over the orbits, zygomatic bone, mastoid.  There is no appreciable swelling of the face or mastoid region.  No indication to reimage her at this time. Pain is near but not over the temporal artery and there is no palpated for, erythema or tenderness over the temporal artery so doubt temporal arteritis.  Is likely ongoing concussion syndrome.  Given Zofran and meclizine while here in the emergency department.  Discussed concussion syndrome with patient and need for follow-up with the concussion clinic.  I told her to call them tomorrow morning.  Discussed that her symptoms may be ongoing for quite some time.  Reeducated her on cognitive rest which she verbalized understanding.  She has a work note until Wednesday that was given to her on Saturday.  She already has oxycodone and Zofran prescribed to her on Saturday.  We will give her prescription for meclizine to help with dizziness.  Given her strict return precautions she understands. I spoke to Dr. Charm Barges about patient and he agrees with the plan at this time. Hemodynamically stable and safe for discharge.   Final Clinical Impression(s) / ED Diagnoses Final diagnoses:  Concussion without loss of consciousness, subsequent encounter    Rx / DC Orders ED Discharge Orders          Ordered    meclizine (ANTIVERT) 25 MG tablet  3 times daily PRN        05/13/21 1656             Cristopher Peru, PA-C 05/13/21 1712    Terrilee Files, MD 05/14/21 Ernestina [place]

## 2021-05-13 NOTE — ED Triage Notes (Signed)
Pt seen here sat after injury to left side of head when she was hit with a baseball, states increased left ear pain, dizziness, fatigue, and balance issues.

## 2021-05-21 ENCOUNTER — Other Ambulatory Visit: Payer: Self-pay

## 2021-05-21 ENCOUNTER — Ambulatory Visit (INDEPENDENT_AMBULATORY_CARE_PROVIDER_SITE_OTHER): Payer: Medicaid Other | Admitting: Sports Medicine

## 2021-05-21 VITALS — BP 140/90 | HR 74 | Ht 61.0 in | Wt 150.0 lb

## 2021-05-21 DIAGNOSIS — S060X9A Concussion with loss of consciousness of unspecified duration, initial encounter: Secondary | ICD-10-CM

## 2021-05-21 DIAGNOSIS — S060X1A Concussion with loss of consciousness of 30 minutes or less, initial encounter: Secondary | ICD-10-CM

## 2021-05-21 NOTE — Progress Notes (Signed)
Susan Mcpherson D.[facility] [address] [place] [address] Phone: [phone]  Assessment and Plan:     1. Concussion with brief LOC -Acute with uncertain prognosis, initial sports medicine visit - Not cleared to return to work due to significant vestibular symptoms. - Refer to vestibular therapy - Ambulatory referral to Physical Therapy      Date of injury was 05/11/21. Symptom severity scores of 11 and 58 today. The patient was counseled on the nature of the injury, typical course and potential options for further evaluation and treatment. Discussed the importance of compliance with the below recommendations.   - Start light aerobic activity while keeping symptoms <3/10 as long as >48 hours from concussive event -  Eliminate screen time as much as possible for first 48 hours from concussive event, then continue limited screen time   - Headache:  OTC analgesics prn headache, encouraged not use then determine school/sports progression   - Encouraged to RTC in 1 week for reassessment or sooner for any concerns or acute changes  - Patient stated understanding of this plan and willingness to comply. All questions were answered.        Pertinent previous records reviewed include ER note x2, CT head     Subjective:   I, Susan Mcpherson, am serving as a scribe for Dr. Richardean Sale  Chief Complaint: concussion  HPI:   05/21/21 Patient states that she was hit by a baseball on the left side of her head just above her ear. Initially patient was dazed but afterward symptoms included dizziness, headaches, left sided head pain, and balance issues. Patient has been taking aleve to help with the headaches. Patient was seen at the ED the same day as the injury and was given Zofran and Oxycodone. Patient was seen at the ED on 9/19 because symptoms were worse. Patient experiencing left ear pain 10/10 constant stabbing pain, Nausea without vomiting, dizziness,  fatigue, and sleeping all the time. Patient was prescribed meclizine and instructed to follow up with concussion clinic.    Concussion HPI:  - Injury date: 05/11/21   - Mechanism of injury: hit on side of head with baseball  - LOC: yes - Initial evaluation: 05/11/21 ED, 05/13/21 ED, 05/21/21 Sports medicine  - Previous head injuries/concussions: yes    - Previous imaging: yes CT of head    - Social history: works at [employer], loading trucks and moving boxes   Hospitalization for head injury? No Diagnosed/treated for headache disorder or migraines? yes Diagnosed with learning disability Susan Mcpherson? No Diagnosed with ADD/ADHD? No Diagnose with Depression, anxiety, or other Psychiatric Disorder? No   Current medications:  Current Outpatient Medications  Medication Sig Dispense Refill   COPPER PO 1 Device by Intrauterine route once.      EMGALITY 120 MG/ML SOAJ Inject 120 mg into the skin every 28 (twenty-eight) days.  11   meclizine (ANTIVERT) 25 MG tablet Take 1 tablet (25 mg total) by mouth 3 (three) times daily as needed for dizziness. (Patient not taking: Reported on 05/21/2021) 30 tablet 0   ondansetron (ZOFRAN ODT) 4 MG disintegrating tablet Take 1 tablet (4 mg total) by mouth every 8 (eight) hours as needed for nausea or vomiting. (Patient not taking: Reported on 05/21/2021) 10 tablet 0   oxyCODONE-acetaminophen (PERCOCET/ROXICET) 5-325 MG tablet Take 1 tablet by mouth every 6 (six) hours as needed for severe pain. (Patient not taking: Reported on 05/21/2021) 8 tablet 0   No current facility-administered  medications for this visit.      Objective:     Vitals:   05/21/21 0902  BP: 140/90  Pulse: 74  SpO2: 98%  Weight: 150 lb (68 kg)  Height: 5\' 1"  (1.549 m)      Body mass index is 28.34 kg/m.    Physical Exam:     General: Well-appearing, cooperative, sitting comfortably in no acute distress.  Psychiatric: Mood and affect are appropriate.     Today's Symptom Severity  Score:  Scores: 0-6  Headache:6 "Pressure in head":4  Neck Pain:4  Nausea or vomiting:0  Dizziness:0  Blurred vision:0  Balance problems:0  Sensitivity to light:0  Sensitivity to noise:0  Feeling slowed down:5  Feeling like "in a fog":6  "Don't feel right":5  Difficulty concentrating:5  Difficulty remembering:6  Fatigue or low energy:6  Confusion:5  Drowsiness:6  More emotional:0  Irritability:0  Sadness:0  Nervous or Anxious:0  Trouble falling asleep:0   Total number of symptoms: 11/22  Symptom Severity index: 58/132  Worse with physical activity? yes Worse with mental activity? yes Percent improved: 90%    Full pain-free cervical PROM: yes    Tandem gait: - Forward, eyes open: 5 errors - Backward, eyes open: 7 errors - Forward, eyes closed: Too unsteady to complete - Backward, eyes closed: Too unsteady to complete  VOMS:   - Baseline symptoms: dizzy, 5/10  - Smooth pursuits: dizzy 7/10  - Vertical Saccades: dizzy 7/10  - Horizontal Saccades: dizzy 7/10   - Vertical Vestibular-Ocular Reflex:dizzy 7/10  - Horizontal Vestibular-Ocular Reflex: dizzy 7/10      Electronically signed by:  12/22 [facility] 9:57 AM 05/21/21

## 2021-05-21 NOTE — Patient Instructions (Addendum)
Good to see you  Out of work for a week Referral to vestibular therapy  Start meletonin 2-5mg  nightly See me again in 1 weeks

## 2021-05-27 NOTE — Progress Notes (Addendum)
Aleen Sells D.[facility] [address] [place] [address] Phone: [phone]  Assessment and Plan:     1. Concussion without loss of consciousness, subsequent encounter -Acute, improving, uncertain prognosis, subsequent sports medicine visit - Decreased overall symptoms despite total symptom score mildly increasing.  Elevated symptom score today likely due to patient's acute migraine - Patient unable to get into vestibular therapy due to insurance.  As she is improving significantly without therapy, will allow for another week of rest.  The patient still has significant vestibular symptoms at follow-up visit would recommend establishing care with vestibular therapy  2.  Jaw pain - Acute, improving, subsequent visit - Left-sided jaw pain from trauma with baseball - May be triggering patient's migraine symptoms - Start meloxicam 15 mg daily x2 weeks and may use remaining as needed for pain control   3.  Visual disturbance - Convergence 20, 20 cm - Encouraged patient to establish care with eye doctor - No acute change in vision or red flag signs  Date of injury was 05/11/21. Symptom severity scores of 10 and 65 today. Original symptom severity scores were 11 and 58. The patient was counseled on the nature of the injury, typical course and potential options for further evaluation and treatment. Discussed the importance of compliance with the below recommendations.   - Start light aerobic activity while keeping symptoms <3/10 as long as >48 hours from concussive event -  Eliminate screen time as much as possible for first 48 hours from concussive event, then continue limited screen time  - Work: Not cleared for return to work due to continued vestibular symptoms.  1 week work note provided   - Headache:  OTC analgesics prn headache, encouraged not use then determine school/sports progression - Vestibular symptoms: With persistent vestibular symptoms consider  vestibular rehab referral   - Encouraged to RTC in 1 week for reassessment or sooner for any concerns or acute changes  - Patient stated understanding of this plan and willingness to comply. All questions were answered.        Pertinent previous records reviewed include previous office note     Subjective:   I, Debbe Odea, am serving as a scribe for Dr. Richardean Sale  Chief Complaint: concussion follow up   HPI:   05/21/21 Patient states that she was hit by a baseball on the left side of her head just above her ear. Initially patient was dazed but afterward symptoms included dizziness, headaches, left sided head pain, and balance issues. Patient has been taking aleve to help with the headaches. Patient was seen at the ED the same day as the injury and was given Zofran and Oxycodone. Patient was seen at the ED on 9/19 because symptoms were worse. Patient experiencing left ear pain 10/10 constant stabbing pain, Nausea without vomiting, dizziness, fatigue, and sleeping all the time. Patient was prescribed meclizine and instructed to follow up with concussion clinic.   05/27/21 Patient states that she has her good days and bad days. Today is one of her bad days. Patient is having worse migraines and has noticed that her jaw is in a lot of pain because she is clenching her jaw a lot. Patient states that would happen a lot when having her seizures. Patient states that she feels about 95% better since her balance issues are a lot better she still notices issues with depth perception.   Concussion HPI:  - Injury date: 05/11/21   - Mechanism of  injury: hit on side of head with baseball  - LOC: yes - Initial evaluation: 05/11/21 ED, 05/13/21 ED, 05/21/21 Sports medicine  - Previous head injuries/concussions: yes    - Previous imaging: yes CT of head    - Social history: works at [employer], loading trucks and moving boxes   Hospitalization for head injury? No Diagnosed/treated for headache  disorder or migraines? yes Diagnosed with learning disability [name]? No Diagnosed with ADD/ADHD? No Diagnose with Depression, anxiety, or other Psychiatric Disorder? No   Current medications:  Current Outpatient Medications  Medication Sig Dispense Refill   COPPER PO 1 Device by Intrauterine route once.      EMGALITY 120 MG/ML SOAJ Inject 120 mg into the skin every 28 (twenty-eight) days.  11   meclizine (ANTIVERT) 25 MG tablet Take 1 tablet (25 mg total) by mouth 3 (three) times daily as needed for dizziness. (Patient not taking: Reported on 05/21/2021) 30 tablet 0   ondansetron (ZOFRAN ODT) 4 MG disintegrating tablet Take 1 tablet (4 mg total) by mouth every 8 (eight) hours as needed for nausea or vomiting. (Patient not taking: Reported on 05/21/2021) 10 tablet 0   oxyCODONE-acetaminophen (PERCOCET/ROXICET) 5-325 MG tablet Take 1 tablet by mouth every 6 (six) hours as needed for severe pain. (Patient not taking: Reported on 05/21/2021) 8 tablet 0   No current facility-administered medications for this visit.      Objective:     There were no vitals filed for this visit.    There is no height or weight on file to calculate BMI.    Physical Exam:     General: Well-appearing, cooperative, sitting comfortably in no acute distress.  Psychiatric: Mood and affect are appropriate.     Today's Symptom Severity Score:  Scores: 0-6  Headache:6 "Pressure in head":6  Neck Pain:6  Nausea or vomiting:0  Dizziness:0  Blurred vision:6  Balance problems:0  Sensitivity to light:6  Sensitivity to noise:0  Feeling slowed down:6  Feeling like "in a fog":6  "Don't feel right":0  Difficulty concentrating:0  Difficulty remembering:5  Fatigue or low energy:6  Confusion:6  Drowsiness:6  More emotional:0  Irritability:0  Sadness:0  Nervous or Anxious:0  Trouble falling asleep:0   Total number of symptoms: 10/22  Symptom Severity index: 65/132  Worse with physical activity? Yes Worse  with mental activity? Yes Percent improved: 95%    Full pain-free cervical PROM: yes    Tandem gait: - Forward, eyes open: 4 errors - Backward, eyes open: 6 errors - Forward, eyes closed: Unable to complete due to imbalance - Backward, eyes closed: Unable to complete due to imbalance  VOMS:   - Baseline symptoms: 0 - Smooth pursuits: Dizzy 5/10  - Vertical Saccades: 0/10  - Horizontal Saccades: 0/10  - Vertical Vestibular-Ocular Reflex: 0/10  - Horizontal Vestibular-Ocular Reflex: Dizzy 5/10  - Visual Motion Sensitivity Test: Dizzy 5/10  - Convergence: 20, 20 cm (<5 cm normal)     Electronically signed by:  Aleen Sells [facility] 4:11 PM 05/27/21

## 2021-05-28 ENCOUNTER — Ambulatory Visit (INDEPENDENT_AMBULATORY_CARE_PROVIDER_SITE_OTHER): Payer: Medicaid Other | Admitting: Sports Medicine

## 2021-05-28 ENCOUNTER — Other Ambulatory Visit: Payer: Self-pay

## 2021-05-28 VITALS — BP 124/86 | HR 72 | Ht 61.0 in | Wt 150.0 lb

## 2021-05-28 DIAGNOSIS — S060X0D Concussion without loss of consciousness, subsequent encounter: Secondary | ICD-10-CM

## 2021-05-28 DIAGNOSIS — R6884 Jaw pain: Secondary | ICD-10-CM

## 2021-05-28 DIAGNOSIS — H539 Unspecified visual disturbance: Secondary | ICD-10-CM | POA: Diagnosis not present

## 2021-05-28 MED ORDER — MELOXICAM 15 MG PO TABS: 15.0000 mg | ORAL_TABLET | Freq: Every day | ORAL | 0 refills | Status: DC

## 2021-05-28 NOTE — Patient Instructions (Addendum)
Good to see you  Start meloxicam 15mg  take for two weeks the rest as needed Work note given  See me again in 1 week

## 2021-05-29 ENCOUNTER — Encounter: Payer: Self-pay | Admitting: Sports Medicine

## 2021-06-03 ENCOUNTER — Other Ambulatory Visit: Payer: Self-pay

## 2021-06-03 ENCOUNTER — Ambulatory Visit: Payer: Medicaid Other | Attending: Sports Medicine | Admitting: Physical Therapy

## 2021-06-03 ENCOUNTER — Encounter: Payer: Self-pay | Admitting: Physical Therapy

## 2021-06-03 DIAGNOSIS — R2689 Other abnormalities of gait and mobility: Secondary | ICD-10-CM | POA: Insufficient documentation

## 2021-06-03 DIAGNOSIS — R2681 Unsteadiness on feet: Secondary | ICD-10-CM | POA: Diagnosis present

## 2021-06-03 DIAGNOSIS — R42 Dizziness and giddiness: Secondary | ICD-10-CM | POA: Insufficient documentation

## 2021-06-03 NOTE — Patient Instructions (Addendum)
Gaze Stabilization: Tip Card  1.Target must remain in focus, not blurry, and appear stationary while head is in motion. 2.Perform exercises with small head movements (45 to either side of midline). 3.Increase speed of head motion so long as target is in focus. 4.If you wear eyeglasses, be sure you can see target through lens (therapist will give specific instructions for bifocal / progressive lenses). 5.These exercises may provoke dizziness or nausea. Work through these symptoms. If too dizzy, slow head movement slightly. Rest between each exercise. 6.Exercises demand concentration; avoid distractions. 7.For safety, perform standing exercises close to a counter, wall, corner, or next to someone.      Gaze Stabilization: Standing Feet Apart    Feet shoulder width apart, keeping eyes on target on wall __5-6__ feet away, tilt head down 15-30 and move head side to side for _60___ seconds. Repeat while moving head up and down for __60__ seconds. Do __3-5__ sessions per day. Repeat using target on pattern background.   Feet Together (Compliant Surface) Head Motion - Eyes Closed; also do with eyes open - add head turns for increased challenge     Stand on compliant surface: pillows________ with feet together. Close eyes and move head slowly, up and down. Repeat __1_ times per session. Do __1-2__ sessions per day.

## 2021-06-03 NOTE — Therapy (Signed)
[facility] [address] [place], [place], [address] Phone: [phone]   Fax:  [phone]  Physical Therapy Evaluation  Patient Details  Name: Susan Mcpherson MRN: [id number] Date of Birth: 03-10-1976 Referring Provider (Susan Mcpherson): Richardean Sale, DO   Encounter Date: 06/03/2021   Susan Mcpherson End of Session - 06/03/21 1858     Visit Number 1    Number of Visits 5   eval + 4 visits   Date for Susan Mcpherson Re-Evaluation 07/05/21    Authorization Type UHC  Medicaid    Susan Mcpherson Start Time 0801    Susan Mcpherson Stop Time 0847    Susan Mcpherson Time Calculation (min) 46 min    Activity Tolerance Patient tolerated treatment well    Behavior During Therapy [facility] for tasks assessed/performed             Past Medical History:  Diagnosis Date   Ectopic pregnancy    Migraine    Seizures (HCC)    not on any medications per MD, last seizure was sometime in 2017    Past Surgical History:  Procedure Laterality Date   Gyn surgery     Lumps in rt breast     RHINOPLASTY     SHOULDER ARTHROSCOPY WITH ROTATOR CUFF REPAIR Right 04/22/2018   Procedure: Right shoulder arthroscopy with subacromial decompression, distal clavicle resection, rotator cuff repair;  Surgeon: Francena Hanly, MD;  Location: [facility] OR;  Service: Orthopedics;  Laterality: Right;     There were no vitals filed for this visit.    Subjective Assessment - 06/03/21 0805     Subjective Susan Mcpherson was hit on left side of head by a baseball on 05-11-21; Susan Mcpherson reports she has dizziness occasionally; states she is much better at this time; still gets headaches but "that could be because of my migraines"; reports some short term memory loss but not bad; walking downstairs makes dizziness worse "I still have depth perception issue - a fun house effect"; reports some problems with balance in the dark. Reports problem with turning head side to side quickly; Marland Kitchen Got dizzy on Saturday as she was watching kids on a ride that went  around in circles.  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    Pertinent History h/o migraines; recent increase in Lt jaw pain (TMJ?) - Susan Mcpherson reports wearing night guard splint at night time which has really helped, h/o seizures (most recent 2017 per chart)    Limitations Lifting;Other (comment)   bending over, turning quickly - provokes dizziness   Diagnostic tests CT w/o contrast    Patient Stated Goals Improve balance to be able to return to work as Academic librarian at [employer]    Currently in Pain? No/denies                [facility] Susan Mcpherson Assessment - 06/03/21 0826       Assessment   Medical Diagnosis Post concussion syndrome    Referring Provider (Susan Mcpherson) Richardean Sale, DO    Onset Date/Surgical Date 05/11/21    Next MD Visit 06-04-21    Prior Therapy None      Precautions   Precautions None      Restrictions   Weight Bearing Restrictions No      Balance Screen   Has the patient fallen in the past 6 months No    Has the  patient had a decrease in activity level because of a fear of falling?  No    Is the patient reluctant to leave their home because of a fear of falling?  No      Prior Function   Level of Independence Independent    Vocation Full time employment    Vocation Requirements Susan Mcpherson is a Academic librarian at [employer] - must be able to turn quickly and lift packages      Observation/Other Assessments   Focus on Therapeutic Outcomes (FOTO)  was not captured by front office   DHI score 22%     Ambulation/Gait   Ambulation/Gait Yes    Ambulation/Gait Assistance 7: Independent    Assistive device None    Gait Pattern Within Functional Limits    Stairs Yes    Stairs Assistance 6: Modified independent (Device/Increase time)    Stair Management Technique Alternating pattern;Other (comment)   no rail; slower with descension due to problem with depth perception   Number of Stairs 4    Height of  Stairs 6      Functional Gait  Assessment   Gait assessed  Yes    Gait Level Surface Walks 20 ft in less than 5.5 sec, no assistive devices, good speed, no evidence for imbalance, normal gait pattern, deviates no more than 6 in outside of the 12 in walkway width.   4.6   Change in Gait Speed Able to smoothly change walking speed without loss of balance or gait deviation. Deviate no more than 6 in outside of the 12 in walkway width.    Gait with Horizontal Head Turns Performs head turns smoothly with slight change in gait velocity (eg, minor disruption to smooth gait path), deviates 6-10 in outside 12 in walkway width, or uses an assistive device.    Gait with Vertical Head Turns Performs head turns with no change in gait. Deviates no more than 6 in outside 12 in walkway width.    Gait and Pivot Turn Pivot turns safely within 3 sec and stops quickly with no loss of balance.    Step Over Obstacle Is able to step over 2 stacked shoe boxes taped together (9 in total height) without changing gait speed. No evidence of imbalance.    Gait with Narrow Base of Support Is able to ambulate for 10 steps heel to toe with no staggering.    Gait with Eyes Closed Walks 20 ft, no assistive devices, good speed, no evidence of imbalance, normal gait pattern, deviates no more than 6 in outside 12 in walkway width. Ambulates 20 ft in less than 7 sec.    Ambulating Backwards Walks 20 ft, no assistive devices, good speed, no evidence for imbalance, normal gait    Steps Alternating feet, no rail.    Total Score 29                    Vestibular Assessment - 06/03/21 0815       Vestibular Assessment   General Observation Susan Mcpherson is a 45 yr old female amb independently without device with c/o intermittent dizziness and imbalance due to getting hit on Lt side of head by a baseball on 05-11-21; Susan Mcpherson states symptoms have signficantly improved in past week      Symptom Behavior   Subjective history of current problem Susan Mcpherson  was hit on Lt side of head by a basebal on 05-11-21; does report onset of Lt jaw pain (TMJ?) but Susan Mcpherson states she  has started wearing a night splint while she sleeps and says this has helped significantly;  Susan Mcpherson reports dizziness is provoked with quick head turns and quick body turns; states she has problems with depth perception but has appt with opthalmologist in Feb. (as early as she could get)    Type of Dizziness  Blurred vision;Imbalance;Spinning;Unsteady with head/body turns;Lightheadedness    Frequency of Dizziness depends on activity    Duration of Dizziness secs    Symptom Nature Motion provoked    Aggravating Factors Turning body quickly;Turning head quickly;Supine to sit    Relieving Factors Rest;Slow movements    Progression of Symptoms Better    History of similar episodes None      Oculomotor Exam   Oculomotor Alignment Normal    Ocular ROM WNL's    Spontaneous Absent    Gaze-induced  Absent    Smooth Pursuits Intact    Saccades Intact      Oculomotor Exam-Fixation Suppressed    Ocular Alignment normal    Spontaneous Nystagmus none      Visual Acuity   Static line 11    Dynamic line 7      Positional Sensitivities   Head Turning x 5 No dizziness    Head Nodding x 5 No dizziness   Susan Mcpherson stated dizziness would be provoked if performed at fast speed   Pivot Right in Standing --   reports dizziness would be provoked if perfomed at fast speed               Objective measurements completed on examination: See above findings.                Susan Mcpherson Education - 06/03/21 1853     Education Details Susan Mcpherson was instructed in x1 viewing in standing - horizontal & vertical; standing on pillows with feet together - EO and EC    Person(s) Educated Patient    Methods Explanation;Demonstration;Handout    Comprehension Verbalized understanding;Returned demonstration              Susan Mcpherson Short Term Goals - 06/03/21 1913       Susan Mcpherson SHORT TERM GOAL #1   Title same as LTG's                Susan Mcpherson Long Term Goals - 06/03/21 1913       Susan Mcpherson LONG TERM GOAL #1   Title Improve DVA to </= 2 line difference for improved gaze stabilization.    Baseline 4 line difference (line 11/line 7) on 06-03-21    Time 4    Period Weeks    Status New    Target Date 07/05/21      Susan Mcpherson LONG TERM GOAL #2   Title Improve DHI score to </= 10% to indicate improvement in dizziness and functional status.    Baseline 22% - 06-03-21    Time 4    Period Weeks    Status New    Target Date 07/05/21      Susan Mcpherson LONG TERM GOAL #3   Title Perform SOT and establish LTG as appropriate.    Baseline To be assessed next session    Time 4    Period Weeks    Status New    Target Date 07/05/21      Susan Mcpherson LONG TERM GOAL #4   Title Susan Mcpherson will perform simulated work activiity of moving boxes from Rt/Lt sides and bending over picking up 5# box without c/o increased dizziness.  Baseline to be assessed    Time 4    Period Weeks    Status New    Target Date 07/05/21      Susan Mcpherson LONG TERM GOAL #5   Title Susan Mcpherson will amb. 64' with intermittent horizontal head turns without LOB or c/o dizziness for incr. safety with environmental scanning.    Baseline Susan Mcpherson amb. 20' with mod deviation in path due to c/o dizziness/LOB    Time 4    Period Weeks    Status New    Target Date 07/05/21                    Plan - 06/03/21 1859     Clinical Impression Statement Susan Mcpherson is a 45 yr old lady with post concussion syndrome due to getting hit on Lt side of her head by a baseball on 05-11-21.  Susan Mcpherson reports symptoms have much improved in past couple of weeks but she continues to c/o dizziness with quick head turns, both horizontal and vertical, and has abnormal VOR with a 4 line difference with DVA.  Susan Mcpherson also has difficulty with convergence with Lt eye not converging compared to convergence of Rt eye.  Susan Mcpherson has unsteadiness with amb. with horizontal head turns and mild postural sway on condition 4 of MCTSIB but no total loss of  balance.  Susan Mcpherson's FGA score is 29/30; DHI score 22% (mild handicap due to dizziness).  Susan Mcpherson will benefit from vestibular rehab to address visual deficits including gaze stabilization and decr. vestibular input in maintaining balance.    Personal Factors and Comorbidities Comorbidity 2    Comorbidities h/o migraines, h/o seizures    Examination-Activity Limitations Bend;Squat;Lift;Carry;Locomotion Level    Examination-Participation Restrictions Occupation;Yard Work;Cleaning;Interpersonal Relationship    Stability/Clinical Decision Making Stable/Uncomplicated    Clinical Decision Making Low    Rehab Potential Good    Susan Mcpherson Frequency 1x / week    Susan Mcpherson Duration 4 weeks    Susan Mcpherson Treatment/Interventions ADLs/Self Care Home Management;Gait training;Stair training;Therapeutic exercise;Therapeutic activities;Balance training;Vestibular;Patient/family education;Neuromuscular re-education    Susan Mcpherson Next Visit Plan please do SOT; check HEP for any questions/problems (issued at eval) - x1 viewing and standing on foam with feet together; dynamic gait activities with visual/vestibular input incorporated    Susan Mcpherson Home Exercise Plan x1 viewing, balance on foam    Recommended Other Services Susan Mcpherson reports she has opthalmologist appt in Feb. 2023    Consulted and Agree with Plan of Care Patient             Patient will benefit from skilled therapeutic intervention in order to improve the following deficits and impairments:  Dizziness, Decreased balance, Decreased activity tolerance, Difficulty walking  Visit Diagnosis: Dizziness and giddiness - Plan: Susan Mcpherson plan of care cert/re-cert  Other abnormalities of gait and mobility - Plan: Susan Mcpherson plan of care cert/re-cert  Unsteadiness on feet - Plan: Susan Mcpherson plan of care cert/re-cert     Problem List Patient Active Problem List   Diagnosis Date Noted   Right shoulder pain 03/29/2018   IUD (intrauterine device) in place 12/26/2016   Migraine without aura and without status migrainosus, not  intractable 10/08/2016   Elevated BP without diagnosis of hypertension 09/27/2015   Protein S deficiency (HCC) 07/13/2013   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

## 2021-06-04 ENCOUNTER — Ambulatory Visit (INDEPENDENT_AMBULATORY_CARE_PROVIDER_SITE_OTHER): Payer: Medicaid Other | Admitting: Sports Medicine

## 2021-06-04 ENCOUNTER — Other Ambulatory Visit: Payer: Self-pay

## 2021-06-04 VITALS — BP 118/82 | HR 67 | Resp 98 | Ht 61.0 in | Wt 150.0 lb

## 2021-06-04 DIAGNOSIS — S060X0D Concussion without loss of consciousness, subsequent encounter: Secondary | ICD-10-CM

## 2021-06-04 DIAGNOSIS — H538 Other visual disturbances: Secondary | ICD-10-CM

## 2021-06-04 NOTE — Patient Instructions (Addendum)
Good to see you  Work note given Referral to ophthalmology hard copy given to see if you can get in sooner  See me again as needed

## 2021-06-04 NOTE — Progress Notes (Addendum)
Susan Mcpherson [facility] [address] [place] [address] Phone: [phone]  Assessment and Plan:     1. Concussion without loss of consciousness, subsequent encounter 2. Blurred vision, bilateral -Acute, nearly improved, subsequent visit -Symptoms have nearly resolved.  Most prominent remaining symptom is continued blurred vision and abnormal convergence - Recommend evaluation by ophthalmology.  Referral sent - May return to work, full days.  Advised to start with 3 days a week and advance as tolerated looking for return of symptoms - Ambulatory referral to Ophthalmology   Date of injury was 05/11/21. Symptom severity scores of 2 and 2 today. Original symptom severity scores were 11 and 58. The patient was counseled on the nature of the injury, typical course and potential options for further evaluation and treatment. Discussed the importance of compliance with the below recommendations.   - Start light aerobic activity while keeping symptoms <3/10 as long as >48 hours from concussive event -  Eliminate screen time as much as possible for first 48 hours from concussive event, then continue limited screen time - Headache:  OTC analgesics prn headache, encouraged not use then determine school/sports progression - Vestibular symptoms: Continue to complete vestibular therapy - Encouraged to RTC as needed - Patient stated understanding of this plan and willingness to comply. All questions were answered.        Pertinent previous records reviewed include previous office visit     Subjective:   I, Susan Mcpherson, am serving as a scribe for Dr. Richardean Mcpherson  Chief Complaint: concussion follow up  HPI:   05/21/21 Patient states that she was hit by a baseball on the left side of her head just above her ear. Initially patient was dazed but afterward symptoms included dizziness, headaches, left sided head pain, and balance issues. Patient has been taking  aleve to help with the headaches. Patient was seen at the ED the same day as the injury and was given Zofran and Oxycodone. Patient was seen at the ED on 9/19 because symptoms were worse. Patient experiencing left ear pain 10/10 constant stabbing pain, Nausea without vomiting, dizziness, fatigue, and sleeping all the time. Patient was prescribed meclizine and instructed to follow up with concussion clinic.    05/27/21 Patient states that she has her good days and bad days. Today is one of her bad days. Patient is having worse migraines and has noticed that her jaw is in a lot of pain because she is clenching her jaw a lot. Patient states that would happen a lot when having her seizures. Patient states that she feels about 95% better since her balance issues are a lot better she still notices issues with depth perception.  06/04/21 Patient states that she is doing great. Patient went to PT yesterday and was told she did great. Patient hs gotten better since mychart about her migraines. Patient states that she only had blurred vision yesterday at PT when the therapist was putting her finger close to her face.    Concussion HPI:  - Injury date: 05/11/21   - Mechanism of injury: hit on side of head with baseball  - LOC: yes - Initial evaluation: 05/11/21 ED, 05/13/21 ED, 05/21/21 Sports medicine  - Previous head injuries/concussions: yes    - Previous imaging: yes CT of head    - Social history: works at [employer], loading trucks and moving boxes   Hospitalization for head injury? No Diagnosed/treated for headache disorder or migraines? yes Diagnosed with  learning disability Susan Mcpherson? No Diagnosed with ADD/ADHD? No Diagnose with Depression, anxiety, or other Psychiatric Disorder? No     Current medications:  Current Outpatient Medications  Medication Sig Dispense Refill   COPPER PO 1 Device by Intrauterine route once.      EMGALITY 120 MG/ML SOAJ Inject 120 mg into the skin every 28 (twenty-eight)  days.  11   lamoTRIgine (LAMICTAL) 25 MG tablet Take 25 mg by mouth QID.     levETIRAcetam (KEPPRA) 500 MG tablet Take 500 mg by mouth 2 (two) times daily.     meclizine (ANTIVERT) 25 MG tablet Take 1 tablet (25 mg total) by mouth 3 (three) times daily as needed for dizziness. (Patient not taking: Reported on 06/03/2021) 30 tablet 0   meloxicam (MOBIC) 15 MG tablet Take 1 tablet (15 mg total) by mouth daily. (Patient taking differently: Take 15 mg by mouth as needed.) 30 tablet 0   No current facility-administered medications for this visit.      Objective:     There were no vitals filed for this visit.    There is no height or weight on file to calculate BMI.    Physical Exam:     General: Well-appearing, cooperative, sitting comfortably in no acute distress.  Psychiatric: Mood and affect are appropriate.     Today's Symptom Severity Score:  Scores: 0-6  Headache:0 "Pressure in head":0  Neck Pain:0  Nausea or vomiting:0  Dizziness:0  Blurred vision:1  Balance problems:0  Sensitivity to light:0  Sensitivity to noise:0  Feeling slowed down:0  Feeling like "in a fog":0  "Don't feel right":0  Difficulty concentrating:0  Difficulty remembering:0  Fatigue or low energy:1  Confusion:0  Drowsiness:1  More emotional:0  Irritability:0  Sadness:0  Nervous or Anxious:0  Trouble falling asleep:0   Total number of symptoms: 2/22  Symptom Severity index: 2/132  Worse with physical activity? No Worse with mental activity? No Percent improved: 99.99%    Full pain-free cervical PROM: yes    Tandem gait: - Forward, eyes open: 0 errors - Backward, eyes open: 0 errors - Forward, eyes closed: 2 errors - Backward, eyes closed: 3 errors  VOMS:   - Baseline symptoms: 0 - Smooth pursuits: 0/10  - Vertical Saccades: 0/10  - Horizontal Saccades:  0/10  - Vertical Vestibular-Ocular Reflex: 0/10  - Horizontal Vestibular-Ocular Reflex: 0/10  - Visual Motion Sensitivity Test:   0/10  - Convergence: 15, 15 cm (<5 cm normal)     Electronically signed by:  Susan Mcpherson [facility] 7:37 AM 06/04/21

## 2021-06-10 ENCOUNTER — Ambulatory Visit: Payer: Medicaid Other | Admitting: Physical Therapy

## 2021-06-17 ENCOUNTER — Ambulatory Visit: Payer: Medicaid Other | Admitting: Physical Therapy

## 2021-06-26 ENCOUNTER — Other Ambulatory Visit: Payer: Self-pay | Admitting: Sports Medicine

## 2021-06-26 ENCOUNTER — Encounter: Payer: Medicaid Other | Admitting: Physical Therapy

## 2021-07-01 ENCOUNTER — Encounter: Payer: Medicaid Other | Admitting: Physical Therapy

## 2021-12-01 ENCOUNTER — Other Ambulatory Visit: Payer: Self-pay

## 2021-12-01 ENCOUNTER — Encounter (HOSPITAL_BASED_OUTPATIENT_CLINIC_OR_DEPARTMENT_OTHER): Payer: Self-pay | Admitting: Emergency Medicine

## 2021-12-01 ENCOUNTER — Emergency Department (HOSPITAL_BASED_OUTPATIENT_CLINIC_OR_DEPARTMENT_OTHER)
Admission: EM | Admit: 2021-12-01 | Discharge: 2021-12-01 | Disposition: A | Discharge status: Home / Self Care | Payer: Medicaid Other | Attending: Emergency Medicine | Admitting: Emergency Medicine

## 2021-12-01 DIAGNOSIS — J069 Acute upper respiratory infection, unspecified: Secondary | ICD-10-CM | POA: Diagnosis not present

## 2021-12-01 DIAGNOSIS — Z20822 Contact with and (suspected) exposure to covid-19: Secondary | ICD-10-CM | POA: Diagnosis not present

## 2021-12-01 DIAGNOSIS — N3000 Acute cystitis without hematuria: Secondary | ICD-10-CM | POA: Diagnosis not present

## 2021-12-01 DIAGNOSIS — J029 Acute pharyngitis, unspecified: Secondary | ICD-10-CM | POA: Diagnosis present

## 2021-12-01 LAB — RESP PANEL BY RT-PCR (FLU A&B, COVID) ARPGX2
Influenza A by PCR: NEGATIVE
Influenza B by PCR: NEGATIVE
SARS Coronavirus 2 by RT PCR: NEGATIVE

## 2021-12-01 LAB — URINALYSIS, MICROSCOPIC (REFLEX)

## 2021-12-01 LAB — GROUP A STREP BY PCR: Group A Strep by PCR: NOT DETECTED

## 2021-12-01 LAB — URINALYSIS, ROUTINE W REFLEX MICROSCOPIC
Bilirubin Urine: NEGATIVE
Glucose, UA: NEGATIVE mg/dL
Ketones, ur: NEGATIVE mg/dL
Leukocytes,Ua: NEGATIVE
Nitrite: POSITIVE — AB
Protein, ur: NEGATIVE mg/dL
Specific Gravity, Urine: 1.025 (ref 1.005–1.030)
pH: 7 (ref 5.0–8.0)

## 2021-12-01 MED ORDER — CEPHALEXIN 500 MG PO CAPS: 500.0000 mg | ORAL_CAPSULE | Freq: Two times a day (BID) | ORAL | 0 refills | Status: AC

## 2021-12-01 MED ORDER — ONDANSETRON HCL 4 MG PO TABS: 4.0000 mg | ORAL_TABLET | Freq: Three times a day (TID) | ORAL | 0 refills | Status: DC | PRN

## 2021-12-01 NOTE — ED Triage Notes (Signed)
Pt reports sore throat, cough, chills since yesterday ?

## 2021-12-01 NOTE — ED Provider Notes (Signed)
?[facility] EMERGENCY DEPARTMENT ?Provider Note ? ? ?CSN: [id number] ?Arrival date & time: 12/01/21  1816 ? ?  ? ?History ? ?Chief Complaint  ?Patient presents with  ? Sore Throat  ? Cough  ? ? ?Susan Mcpherson is a 46 y.o. female. ? ?The history is provided by the patient. No language interpreter was used.  ?Sore Throat ?This is a new problem. The current episode started yesterday. The problem occurs constantly. The problem has not changed since onset.Associated symptoms include headaches. Pertinent negatives include no chest pain, no abdominal pain and no shortness of breath. Nothing aggravates the symptoms. Nothing relieves the symptoms. She has tried nothing for the symptoms. The treatment provided no relief.  ?Cough ?Associated symptoms: chills, fever, headaches and sore throat   ?Associated symptoms: no chest pain, no diaphoresis, no shortness of breath and no wheezing   ? ?  ? ?Home Medications ?Prior to Admission medications   ?Medication Sig Start Date End Date Taking? Authorizing Provider  ?COPPER PO 1 Device by Intrauterine route once.  12/26/16   [provider]  ?EMGALITY 120 MG/ML SOAJ Inject 120 mg into the skin every 28 (twenty-eight) days. 03/28/18   [provider]  ?lamoTRIgine (LAMICTAL) 25 MG tablet Take 25 mg by mouth QID.    [provider]  ?levETIRAcetam (KEPPRA) 500 MG tablet Take 500 mg by mouth 2 (two) times daily.    [provider]  ?meloxicam (MOBIC) 15 MG tablet Take 1 tablet (15 mg total) by mouth daily. ?Patient taking differently: Take 15 mg by mouth as needed. 05/28/21   Glennon Mac, DO  ?   ? ?Allergies    ?Sumatriptan, Cinnamon, Codeine, and Latex   ? ?Review of Systems   ?Review of Systems  ?Constitutional:  Positive for chills, fatigue and fever. Negative for diaphoresis.  ?HENT:  Positive for congestion and sore throat.   ?Eyes:  Negative for visual disturbance.  ?Respiratory:  Positive for cough. Negative for chest tightness,  shortness of breath, wheezing and stridor.   ?Cardiovascular:  Negative for chest pain and palpitations.  ?Gastrointestinal:  Negative for abdominal distention, abdominal pain, constipation, diarrhea, nausea and vomiting.  ?Genitourinary:  Positive for dysuria, frequency and urgency. Negative for flank pain.  ?Musculoskeletal:  Negative for back pain, gait problem, neck pain and neck stiffness.  ?Neurological:  Positive for headaches. Negative for dizziness, weakness, light-headedness and numbness.  ?Psychiatric/Behavioral:  Negative for agitation and confusion.   ?All other systems reviewed and are negative. ? ?Physical Exam ?Updated Vital Signs ?BP (!) 144/90 (BP Location: Left Arm)   Pulse 86   Temp 98.4 ?F (36.9 ?C)   Resp 18   Ht 5' (1.524 m)   Wt 66.7 kg   LMP 12/01/2021   SpO2 98%   BMI 28.71 kg/m?  ?Physical Exam ?Vitals and nursing note reviewed.  ?Constitutional:   ?   General: She is not in acute distress. ?   Appearance: She is well-developed. She is not ill-appearing, toxic-appearing or diaphoretic.  ?HENT:  ?   Head: Normocephalic and atraumatic.  ?   Nose: Congestion and rhinorrhea present.  ?   Mouth/Throat:  ?   Mouth: Mucous membranes are moist.  ?   Pharynx: No oropharyngeal exudate or posterior oropharyngeal erythema.  ?Eyes:  ?   Extraocular Movements: Extraocular movements intact.  ?   Conjunctiva/sclera: Conjunctivae normal.  ?   Pupils: Pupils are equal, round, and reactive to light.  ?Cardiovascular:  ?  Rate and Rhythm: Normal rate and regular rhythm.  ?   Heart sounds: No murmur heard. ?Pulmonary:  ?   Effort: Pulmonary effort is normal. No respiratory distress.  ?   Breath sounds: Normal breath sounds. No rhonchi or rales.  ?Chest:  ?   Chest wall: No tenderness.  ?Abdominal:  ?   General: Abdomen is flat. There is no distension.  ?   Palpations: Abdomen is soft.  ?   Tenderness: There is no abdominal tenderness. There is no right CVA tenderness, left CVA tenderness, guarding or  rebound.  ?Musculoskeletal:     ?   General: No swelling or tenderness.  ?   Cervical back: Neck supple. No tenderness.  ?Skin: ?   General: Skin is warm and dry.  ?   Capillary Refill: Capillary refill takes less than 2 seconds.  ?   Findings: No erythema or rash.  ?Neurological:  ?   General: No focal deficit present.  ?   Mental Status: She is alert. Mental status is at baseline.  ?   Sensory: No sensory deficit.  ?   Motor: No weakness.  ?Psychiatric:     ?   Mood and Affect: Mood normal.  ? ? ?ED Results / Procedures / Treatments   ?Labs ?(all labs ordered are listed, but only abnormal results are displayed) ?Labs Reviewed  ?URINALYSIS, ROUTINE W REFLEX MICROSCOPIC - Abnormal; Notable for the following components:  ?    Result Value  ? APPearance HAZY (*)   ? Hgb urine dipstick SMALL (*)   ? Nitrite POSITIVE (*)   ? All other components within normal limits  ?URINALYSIS, MICROSCOPIC (REFLEX) - Abnormal; Notable for the following components:  ? Bacteria, UA MANY (*)   ? All other components within normal limits  ?RESP PANEL BY RT-PCR (FLU A&B, COVID) [id number]  ?GROUP A STREP BY PCR  ?URINE CULTURE  ? ? ?EKG ?None ? ?Radiology ?No results found. ? ?Procedures ?Procedures  ? ? ?Medications Ordered in ED ?Medications - No data to display ? ?ED Course/ Medical Decision Making/ A&P ?  ?                        ?Medical Decision Making ?Amount and/or Complexity of Data Reviewed ?Labs: ordered. ? ? ? ?Susan ScheuermannLeah B Mcpherson is a 46 y.o. female with a past medical history significant for seizures, migraines, previous ectopic pregnancy, and protein S deficiency who presents with 2 days of subjective fevers, chills, cough, sore throat, myalgias, and fatigue.  According to patient, all the symptoms have begun over the last few days.  She has felt chills but has not taken her temperature at home.  She reported sore throat and some pain with swallowing.  She reports she still able to eat and drink however.  She reports no significant  nausea, vomiting, constipation, or diarrhea.  She does report she has had some urgency and some frequency with her urine and feels like previous UTIs.  She denies any rashes.  She reports she is not having neck stiffness or neck rigidity.  She does report some headaches.  She denies any trauma.  She reports the fatigue and malaise and myalgias started and then the other symptoms all developed.  She denies any chest pain or shortness of breath or wheezing.  She reports the cough has been intermittently productive but no hemoptysis was described. ? ?On exam, lungs were clear and chest was nontender.  Abdomen  was nontender.  Patient had no rash on my exam.  No nuchal rigidity or stiffness.  Oropharyngeal exam had no evidence of PTA or RPA.  There was some visible and audible congestion and drainage seen.  Abdomen was nontender.  Chest was nontender.  Exam was otherwise unremarkable. ? ?Clinically I suspect a viral infection.  Given the ongoing pandemic, will check for COVID and flu.  For the the sore throat, patient had a strep swab obtained in triage.  With the urinary symptoms now described, will check urinalysis and culture. ? ?If work-up is reassuring, suspect viral infection.  Anticipate reassessment after work-up. ? ?9:12 PM ?Work-up returned confirming urinary tract infection but otherwise COVID/flu and strep test were negative.  Suspect viral infection otherwise.  Lungs remain clear and vital signs did not reveal hypotension, tachycardia, or hypoxia.  Still agreed to hold on chest x-ray at this time. ? ?Patient agrees with plan of care and will give prescription for antibiotics for the UTI and work note for her.  She agrees with return precautions and follow-up instructions and was discharged in good condition. ? ? ? ? ? ? ? ? ?Final Clinical Impression(s) / ED Diagnoses ?Final diagnoses:  ?Upper respiratory tract infection, unspecified type  ?Acute cystitis without hematuria  ? ? ?Rx / DC Orders ?ED Discharge  Orders   ? ?      Ordered  ?  cephALEXin (KEFLEX) 500 MG capsule  2 times daily       ? 12/01/21 2115  ?  ondansetron (ZOFRAN) 4 MG tablet  Every 8 hours PRN       ? 12/01/21 2115  ? ?  ?  ? ?  ? ? ?Clinical Impres

## 2021-12-01 NOTE — ED Notes (Signed)
Rx x 2 given  Written and verbal inst to pt  Verbalized an understanding  To home  

## 2021-12-01 NOTE — Discharge Instructions (Signed)
Your history, exam, work-up today are consistent with a likely viral upper respiratory infection causing the congestion cough and malaise symptoms and we did discover urinary tract infection as well.  Please take the antibiotics to treat the UTI and rest and stay hydrated.  Please use the nausea medicine if you have any nausea as I want you to stay hydrated.  Please rest.  If any symptoms change or worsen acutely, please return to the nearest emergency department and please follow-up with your primary care physician.  ?

## 2021-12-04 LAB — URINE CULTURE: Culture: >=100000 — AB

## 2021-12-05 ENCOUNTER — Telehealth: Payer: Self-pay

## 2021-12-05 NOTE — Telephone Encounter (Signed)
Post ED Visit - Positive Culture Follow-up: Successful Patient Follow-Up ? ?Culture assessed and recommendations reviewed by: ? ?[x]  Nason Wise, Pharm.D. ?[]  , Pharm.D., BCPS AQ-ID ?[]  , Pharm.D., BCPS ?[]  Celedonio Miyamoto, Pharm.D., BCPS ?[]  [place], Garvin Fila.D., BCPS, AAHIVP ?[]  , Pharm.D., BCPS, AAHIVP ?[]  Georgina Pillion, PharmD, BCPS ?[]  , PharmD, BCPS ?[]  [place], PharmD, BCPS ?[]  [address], PharmD ? ?Positive urine culture ? ?[]  Patient discharged without antimicrobial prescription and treatment is now indicated ?[x]  Organism is resistant to prescribed ED discharge antimicrobial ?[]  Patient with positive blood cultures ? ?Changes discussed with ED provider: , MD ?New antibiotic prescription Bactrim DS 1 tab BID x 3 days  ?Called to [facility] in ? ?Contacted patient, date 12/05/2021, time 1805 ? ? ?Phillips Climes ?12/05/2021, 6:09 PM ? ?  ?

## 2021-12-20 ENCOUNTER — Emergency Department (HOSPITAL_BASED_OUTPATIENT_CLINIC_OR_DEPARTMENT_OTHER)
Admission: EM | Admit: 2021-12-20 | Discharge: 2021-12-20 | Disposition: A | Discharge status: Home / Self Care | Payer: Medicaid Other | Attending: Emergency Medicine | Admitting: Emergency Medicine

## 2021-12-20 ENCOUNTER — Other Ambulatory Visit: Payer: Self-pay

## 2021-12-20 ENCOUNTER — Emergency Department (HOSPITAL_BASED_OUTPATIENT_CLINIC_OR_DEPARTMENT_OTHER): Payer: Medicaid Other

## 2021-12-20 ENCOUNTER — Encounter (HOSPITAL_BASED_OUTPATIENT_CLINIC_OR_DEPARTMENT_OTHER): Payer: Self-pay

## 2021-12-20 DIAGNOSIS — N390 Urinary tract infection, site not specified: Secondary | ICD-10-CM

## 2021-12-20 DIAGNOSIS — Z79899 Other long term (current) drug therapy: Secondary | ICD-10-CM | POA: Diagnosis not present

## 2021-12-20 DIAGNOSIS — Z91040 Latex allergy status: Secondary | ICD-10-CM | POA: Insufficient documentation

## 2021-12-20 DIAGNOSIS — R1031 Right lower quadrant pain: Secondary | ICD-10-CM | POA: Diagnosis present

## 2021-12-20 DIAGNOSIS — Z96611 Presence of right artificial shoulder joint: Secondary | ICD-10-CM | POA: Diagnosis not present

## 2021-12-20 DIAGNOSIS — N200 Calculus of kidney: Secondary | ICD-10-CM

## 2021-12-20 DIAGNOSIS — N132 Hydronephrosis with renal and ureteral calculous obstruction: Secondary | ICD-10-CM | POA: Insufficient documentation

## 2021-12-20 LAB — URINALYSIS, MICROSCOPIC (REFLEX)

## 2021-12-20 LAB — URINALYSIS, ROUTINE W REFLEX MICROSCOPIC
Glucose, UA: NEGATIVE mg/dL
Ketones, ur: NEGATIVE mg/dL
Nitrite: NEGATIVE
Protein, ur: 100 mg/dL — AB
Specific Gravity, Urine: >=1.03 (ref 1.005–1.030)
pH: 5.5 (ref 5.0–8.0)

## 2021-12-20 LAB — COMPREHENSIVE METABOLIC PANEL
ALT: 41 U/L (ref 0–44)
AST: 25 U/L (ref 15–41)
Albumin: 3.3 g/dL — ABNORMAL LOW (ref 3.5–5.0)
Alkaline Phosphatase: 90 U/L (ref 38–126)
Anion gap: 7 (ref 5–15)
BUN: 12 mg/dL (ref 6–20)
CO2: 24 mmol/L (ref 22–32)
Calcium: 8.4 mg/dL — ABNORMAL LOW (ref 8.9–10.3)
Chloride: 101 mmol/L (ref 98–111)
Creatinine, Ser: 0.85 mg/dL (ref 0.44–1.00)
GFR, Estimated: >60 mL/min (ref >60)
Glucose, Bld: 103 mg/dL — ABNORMAL HIGH (ref 70–99)
Potassium: 4 mmol/L (ref 3.5–5.1)
Sodium: 132 mmol/L — ABNORMAL LOW (ref 135–145)
Total Bilirubin: 0.3 mg/dL (ref 0.3–1.2)
Total Protein: 6.5 g/dL (ref 6.5–8.1)

## 2021-12-20 LAB — LIPASE, BLOOD: Lipase: 26 U/L (ref 11–51)

## 2021-12-20 LAB — CBC
HCT: 31.1 % — ABNORMAL LOW (ref 36.0–46.0)
Hemoglobin: 10.7 g/dL — ABNORMAL LOW (ref 12.0–15.0)
MCH: 31.5 pg (ref 26.0–34.0)
MCHC: 34.4 g/dL (ref 30.0–36.0)
MCV: 91.5 fL (ref 80.0–100.0)
Platelets: 150 K/uL (ref 150–400)
RBC: 3.4 MIL/uL — ABNORMAL LOW (ref 3.87–5.11)
RDW: 13.6 % (ref 11.5–15.5)
WBC: 5.4 K/uL (ref 4.0–10.5)
nRBC: 0 % (ref 0.0–0.2)

## 2021-12-20 LAB — PREGNANCY, URINE: Preg Test, Ur: NEGATIVE

## 2021-12-20 MED ORDER — ACETAMINOPHEN 325 MG PO TABS: 650.0000 mg | ORAL_TABLET | Freq: Four times a day (QID) | ORAL | 0 refills | Status: AC | PRN

## 2021-12-20 MED ORDER — ONDANSETRON HCL 4 MG/2ML IJ SOLN
4.0000 mg | Freq: Once | INTRAMUSCULAR | Status: AC
  Administered 2021-12-20: 4 mg via INTRAVENOUS
  Filled 2021-12-20: qty 2

## 2021-12-20 MED ORDER — HYDROMORPHONE HCL 1 MG/ML IJ SOLN: 1.0000 mg | Freq: Once | INTRAMUSCULAR | Status: DC

## 2021-12-20 MED ORDER — IBUPROFEN 600 MG PO TABS: 600.0000 mg | ORAL_TABLET | Freq: Four times a day (QID) | ORAL | 0 refills | Status: AC | PRN

## 2021-12-20 MED ORDER — CEFUROXIME AXETIL 500 MG PO TABS: 500.0000 mg | ORAL_TABLET | Freq: Two times a day (BID) | ORAL | 0 refills | Status: DC

## 2021-12-20 MED ORDER — KETOROLAC TROMETHAMINE 30 MG/ML IJ SOLN
30.0000 mg | Freq: Once | INTRAMUSCULAR | Status: AC
  Administered 2021-12-20: 30 mg via INTRAVENOUS
  Filled 2021-12-20: qty 1

## 2021-12-20 MED ORDER — OXYCODONE HCL 5 MG PO TABS: 5.0000 mg | ORAL_TABLET | ORAL | 0 refills | Status: DC | PRN

## 2021-12-20 MED ORDER — SODIUM CHLORIDE 0.9 % IV BOLUS
1000.0000 mL | Freq: Once | INTRAVENOUS | Status: AC
  Administered 2021-12-20: 1000 mL via INTRAVENOUS

## 2021-12-20 MED ORDER — IOHEXOL 300 MG/ML  SOLN
100.0000 mL | Freq: Once | INTRAMUSCULAR | Status: AC | PRN
  Administered 2021-12-20: 100 mL via INTRAVENOUS

## 2021-12-20 MED ORDER — SODIUM CHLORIDE 0.9 % IV SOLN
1.0000 g | Freq: Once | INTRAVENOUS | Status: AC
  Administered 2021-12-20: 1 g via INTRAVENOUS
  Filled 2021-12-20: qty 10

## 2021-12-20 MED ORDER — TAMSULOSIN HCL 0.4 MG PO CAPS: 0.4000 mg | ORAL_CAPSULE | Freq: Every day | ORAL | 0 refills | Status: DC

## 2021-12-20 MED ORDER — HYDROMORPHONE HCL 1 MG/ML IJ SOLN
1.0000 mg | Freq: Once | INTRAMUSCULAR | Status: AC
  Administered 2021-12-20: 1 mg via INTRAVENOUS
  Filled 2021-12-20: qty 1

## 2021-12-20 NOTE — Discharge Instructions (Addendum)
It was a pleasure caring for you today in the emergency department. ? ?Please return to the emergency department for any worsening or worrisome symptoms. ? ?You may stop taking Keflex, please take Ceftin instead ? ?Please follow-up with urology ?

## 2021-12-20 NOTE — ED Triage Notes (Signed)
Pt reports dx with 4 mm kidney stone Wednesday am at [facility]. Given hydrocodone and antibiotic. No relief in pain . Reports temp 103.3 during the night ?

## 2021-12-20 NOTE — ED Provider Notes (Addendum)
[facility] EMERGENCY DEPARTMENT Provider Note   CSN: [id number] Arrival date & time: 12/20/21  1043     History  Chief Complaint  Patient presents with   Abdominal Pain    Susan Mcpherson is a 46 y.o. female.   Patient as above with significant medical history as below, including ectopic pregnancy, migraine, seizures not on AEDs, kidney stone who presents to the ED with complaint of abdominal pain. Eval OSH where patient received CT 4/26 with 4 mm right-sided ureteral stone with possible right-sided fornix rupture.  Urology was consulted recommended empiric antibiotics, Flomax and urology follow-up x1 week.  Symptoms worsen.  Fevers and chills, ongoing pain.  She has been taking Motrin and Tylenol for discomfort, oral antibiotics.  Right flank and right lower quadrant pain. Sharp, stabbing. Some dysuria, feels like she has a UTI.      Past Medical History: No date: Ectopic pregnancy No date: Migraine No date: Seizures [facility])     Comment:  not on any medications per MD, last seizure was sometime              in 2017  Past Surgical History: No date: Gyn surgery No date: HERNIA REPAIR No date: KIDNEY STONE SURGERY; Right No date: Lumps in rt breast No date: RHINOPLASTY 04/22/2018: SHOULDER ARTHROSCOPY WITH ROTATOR CUFF REPAIR; Right     Comment:  Procedure: Right shoulder arthroscopy with subacromial               decompression, distal clavicle resection, rotator cuff               repair;  Surgeon: Francena Hanly, MD;  Location: [facility] OR;                Service: Orthopedics;  Laterality: Right;      Abdominal Pain Associated symptoms: dysuria and fever   Associated symptoms: no chest pain, no chills, no cough, no hematuria, no nausea, no shortness of breath and no vomiting       Home Medications Prior to Admission medications   Medication Sig Start Date End Date Taking? Authorizing Provider  acetaminophen (TYLENOL) 325 MG tablet Take 2 tablets (650 mg  total) by mouth every 6 (six) hours as needed. 12/20/21  Yes Tanda Rockers A, DO  cefUROXime (CEFTIN) 500 MG tablet Take 1 tablet (500 mg total) by mouth 2 (two) times daily with a meal for 7 days. 12/20/21 12/27/21 Yes Tanda Rockers A, DO  ibuprofen (ADVIL) 600 MG tablet Take 1 tablet (600 mg total) by mouth every 6 (six) hours as needed. 12/20/21  Yes Tanda Rockers A, DO  oxyCODONE (ROXICODONE) 5 MG immediate release tablet Take 1 tablet (5 mg total) by mouth every 4 (four) hours as needed for severe pain. 12/20/21  Yes Tanda Rockers A, DO  tamsulosin (FLOMAX) 0.4 MG CAPS capsule Take 1 capsule (0.4 mg total) by mouth daily for 14 days. 12/20/21 01/03/22 Yes Tanda Rockers A, DO  COPPER PO 1 Device by Intrauterine route once.  12/26/16   [provider]  EMGALITY 120 MG/ML SOAJ Inject 120 mg into the skin every 28 (twenty-eight) days. 03/28/18   [provider]  lamoTRIgine (LAMICTAL) 25 MG tablet Take 25 mg by mouth QID.    [provider]  levETIRAcetam (KEPPRA) 500 MG tablet Take 500 mg by mouth 2 (two) times daily.    [provider]  meloxicam (MOBIC) 15 MG tablet Take 1 tablet (15 mg total) by mouth daily.  Patient taking differently: Take 15 mg by mouth as needed. 05/28/21   Richardean Sale, DO  ondansetron (ZOFRAN) 4 MG tablet Take 1 tablet (4 mg total) by mouth every 8 (eight) hours as needed for nausea or vomiting. 12/01/21   Tegeler, Canary Brim, MD      Allergies    Sumatriptan, Cinnamon, Codeine, and Latex    Review of Systems   Review of Systems  Constitutional:  Positive for fever. Negative for chills.  HENT:  Negative for facial swelling and trouble swallowing.   Eyes:  Negative for photophobia and visual disturbance.  Respiratory:  Negative for cough and shortness of breath.   Cardiovascular:  Negative for chest pain and palpitations.  Gastrointestinal:  Positive for abdominal pain. Negative for nausea and vomiting.  Endocrine: Negative for polydipsia and  polyuria.  Genitourinary:  Positive for dysuria. Negative for difficulty urinating and hematuria.  Musculoskeletal:  Negative for gait problem and joint swelling.  Skin:  Negative for pallor and rash.  Neurological:  Negative for syncope and headaches.  Psychiatric/Behavioral:  Negative for agitation and confusion.    Physical Exam Updated Vital Signs BP 118/72   Pulse 61   Temp 98.3 F (36.8 C) (Oral)   Resp 17   Ht 5' (1.524 m)   Wt 66.6 kg   LMP 12/01/2021 Comment: neg upreg in er today.  SpO2 94%   BMI 28.68 kg/m  Physical Exam Vitals and nursing note reviewed.  Constitutional:      General: She is in acute distress.     Appearance: Normal appearance. She is not ill-appearing or diaphoretic.     Comments: Acute distress secondary to discomfort  HENT:     Head: Normocephalic and atraumatic.     Right Ear: External ear normal.     Left Ear: External ear normal.     Nose: Nose normal.     Mouth/Throat:     Mouth: Mucous membranes are moist.  Eyes:     General: No scleral icterus.       Right eye: No discharge.        Left eye: No discharge.  Cardiovascular:     Rate and Rhythm: Normal rate and regular rhythm.     Pulses: Normal pulses.     Heart sounds: Normal heart sounds.  Pulmonary:     Effort: Pulmonary effort is normal. No respiratory distress.     Breath sounds: Normal breath sounds.  Abdominal:     General: Abdomen is flat.     Palpations: Abdomen is soft.     Tenderness: There is abdominal tenderness in the right lower quadrant. There is guarding.     Comments: Not peritoneal  Musculoskeletal:        General: Normal range of motion.     Cervical back: Full passive range of motion without pain and normal range of motion.     Right lower leg: No edema.     Left lower leg: No edema.  Skin:    General: Skin is warm and dry.     Capillary Refill: Capillary refill takes less than 2 seconds.  Neurological:     Mental Status: She is alert.  Psychiatric:         Mood and Affect: Mood normal.        Behavior: Behavior normal.    ED Results / Procedures / Treatments   Labs (all labs ordered are listed, but only abnormal results are displayed) Labs Reviewed  COMPREHENSIVE METABOLIC PANEL -  Abnormal; Notable for the following components:      Result Value   Sodium 132 (*)    Glucose, Bld 103 (*)    Calcium 8.4 (*)    Albumin 3.3 (*)    All other components within normal limits  CBC - Abnormal; Notable for the following components:   RBC 3.40 (*)    Hemoglobin 10.7 (*)    HCT 31.1 (*)    All other components within normal limits  URINALYSIS, ROUTINE W REFLEX MICROSCOPIC - Abnormal; Notable for the following components:   APPearance CLOUDY (*)    Hgb urine dipstick LARGE (*)    Bilirubin Urine SMALL (*)    Protein, ur 100 (*)    Leukocytes,Ua SMALL (*)    All other components within normal limits  URINALYSIS, MICROSCOPIC (REFLEX) - Abnormal; Notable for the following components:   Bacteria, UA MANY (*)    All other components within normal limits  URINE CULTURE  LIPASE, BLOOD  PREGNANCY, URINE    EKG None  Radiology CT ABDOMEN PELVIS W CONTRAST  Result Date: 12/20/2021 CLINICAL DATA:  Right lower quadrant abdominal pain. Recent CT with possible forniceal rupture. EXAM: CT ABDOMEN AND PELVIS WITH CONTRAST TECHNIQUE: Multidetector CT imaging of the abdomen and pelvis was performed using the standard protocol following bolus administration of intravenous contrast. RADIATION DOSE REDUCTION: This exam was performed according to the departmental dose-optimization program which includes automated exposure control, adjustment of the mA and/or kV according to patient size and/or use of iterative reconstruction technique. CONTRAST:  OMNIPAQUE IOHEXOL 300 MG/ML  SOLN COMPARISON:  CT December 18, 2021 FINDINGS: Lower chest: Hypoventilatory change in the lung bases. Hepatobiliary: No suspicious hepatic lesion. Gallbladder is unremarkable. No  biliary ductal dilation. Pancreas: No pancreatic ductal dilation or evidence of acute inflammation. Spleen: No splenomegaly. Adrenals/Urinary Tract: Bilateral adrenal glands appear normal. Mild right hydroureteronephrosis to the level of a 4 mm distal ureteral stone, decreased from prior. Mild perinephric and periureteric stranding may be reactive or reflect sequela of superimposed infection. Left kidney is unremarkable. Urinary bladder is unremarkable for degree of distension. Stomach/Bowel: No radiopaque enteric contrast material. No evidence of bowel obstruction. Moderate volume of formed stool throughout the colon. Vascular/Lymphatic: Normal caliber abdominal aorta. No pathologically enlarged abdominal or pelvic lymph nodes. Reproductive: Intrauterine device.  No suspicious adnexal mass. Other: Small volume pelvic free fluid. Musculoskeletal: No acute osseous abnormality. IMPRESSION: 1. Decreased now mild right hydroureteronephrosis to the level of a 4 mm distal ureteral stone. Mild perinephric and periureteric stranding may be reactive or reflect sequela of superimposed infection. 2. Moderate volume of formed stool throughout the colon. 3. Small volume pelvic free fluid . Electronically Signed   By: Maudry Mayhew M.D.   On: 12/20/2021 13:10    Procedures Procedures    Medications Ordered in ED Medications  HYDROmorphone (DILAUDID) injection 1 mg (1 mg Intravenous Not Given 12/20/21 1425)  HYDROmorphone (DILAUDID) injection 1 mg (1 mg Intravenous Given 12/20/21 1143)  sodium chloride 0.9 % bolus 1,000 mL (0 mLs Intravenous Stopped 12/20/21 1502)  ondansetron (ZOFRAN) injection 4 mg (4 mg Intravenous Given 12/20/21 1143)  cefTRIAXone (ROCEPHIN) 1 g in sodium chloride 0.9 % 100 mL IVPB (0 g Intravenous Stopped 12/20/21 1218)  iohexol (OMNIPAQUE) 300 MG/ML solution 100 mL (100 mLs Intravenous Contrast Given 12/20/21 1246)  ketorolac (TORADOL) 30 MG/ML injection 30 mg (30 mg Intravenous Given 12/20/21 1416)     ED Course/ Medical Decision Making/ A&P  Medical Decision Making Amount and/or Complexity of Data Reviewed Labs: ordered. Radiology: ordered.  Risk OTC drugs. Prescription drug management.    CC: Flank pain, abdominal pain, kidney stone  This patient presents to the Emergency Department for the above complaint. This involves an extensive number of treatment options and is a complaint that carries with it a high risk of complications and morbidity. Vital signs were reviewed. Serious etiologies considered.  DDX includes but not limited to nephrolithiasis, pyelonephritis, cystitis, septic stone,  Record review:  Previous records obtained and reviewed prior ED visits, prior imaging and labs, prior consult notes.  PDMP  Additional history obtained from mother  Medical and surgical history as noted above.   Work up as above, notable for:  Labs & imaging results that were available during my care of the patient were visualized by me and considered in my medical decision making.   I ordered imaging studies which included CT abdomen pelvis with IV contrast and I visualized the imaging and I agree with radiologist interpretation.  Right-sided hydroureteronephrosis with 4 mm distal ureteral stone, stool burden.  Agree with radiologist  Cardiac monitoring reviewed and interpreted personally which shows NSR  UA is consistent with infection.  Reviewed prior urine cultures.  We will adjust antibiotic.  Gave Rocephin x1 in the ED.  Pain greatly improved in the ED following intervention.  She is able to tolerate p.o. intake.  Resting comfortably after intervention. Pain greatly improved.  Labs reviewed, she does not meet sepsis criteria.  She has UTI with a stone.  Given interval improvement on CT imaging, stable renal function, stable vital signs in the ED and resolution of pain is reasonable to trial further outpatient therapy.  We will continue antibiotics with  adjusted regimen after reviewing cultures.  Analgesics for home.  Strict patient follow-up precautions.  Follow-up with urology in the next few days.  The patient improved significantly and was discharged in stable condition. Detailed discussions were had with the patient regarding current findings, and need for close f/u with PCP or on call doctor. The patient has been instructed to return immediately if the symptoms worsen in any way for re-evaluation. Patient verbalized understanding and is in agreement with current care plan. All questions answered prior to discharge.                  Social determinants of health include -  Social History   Socioeconomic History   Marital status: Divorced    Spouse name: Not on file   Number of children: Not on file   Years of education: Not on file   Highest education level: Not on file  Occupational History   Not on file  Tobacco Use   Smoking status: Never   Smokeless tobacco: Never  Vaping Use   Vaping Use: Never used  Substance and Sexual Activity   Alcohol use: No   Drug use: No   Sexual activity: Not on file  Other Topics Concern   Not on file  Social History Narrative   Not on file   Social Determinants of Health   Financial Resource Strain: Not on file  Food Insecurity: Not on file  Transportation Needs: Not on file  Physical Activity: Not on file  Stress: Not on file  Social Connections: Not on file  Intimate Partner Violence: Not on file      This chart was dictated using voice recognition software.  Despite best efforts to proofread,  errors can occur which  can change the documentation meaning.         Final Clinical Impression(s) / ED Diagnoses Final diagnoses:  Urinary tract infection with hematuria, site unspecified  Nephrolithiasis    Rx / DC Orders ED Discharge Orders          Ordered    oxyCODONE (ROXICODONE) 5 MG immediate release tablet  Every 4 hours PRN        12/20/21 1452     ibuprofen (ADVIL) 600 MG tablet  Every 6 hours PRN        12/20/21 1452    acetaminophen (TYLENOL) 325 MG tablet  Every 6 hours PRN        12/20/21 1452    cefUROXime (CEFTIN) 500 MG tablet  2 times daily with meals        12/20/21 1452    tamsulosin (FLOMAX) 0.4 MG CAPS capsule  Daily        12/20/21 1452              Sloan Leiter, DO 12/20/21 1611    Sloan Leiter, DO 12/20/21 [id number]

## 2021-12-22 LAB — URINE CULTURE: Culture: NO GROWTH

## 2021-12-24 ENCOUNTER — Other Ambulatory Visit (HOSPITAL_BASED_OUTPATIENT_CLINIC_OR_DEPARTMENT_OTHER): Payer: Self-pay

## 2021-12-24 ENCOUNTER — Encounter (HOSPITAL_BASED_OUTPATIENT_CLINIC_OR_DEPARTMENT_OTHER): Payer: Self-pay

## 2021-12-24 ENCOUNTER — Other Ambulatory Visit: Payer: Self-pay

## 2021-12-24 ENCOUNTER — Emergency Department (HOSPITAL_BASED_OUTPATIENT_CLINIC_OR_DEPARTMENT_OTHER)
Admission: EM | Admit: 2021-12-24 | Discharge: 2021-12-24 | Disposition: A | Discharge status: Home / Self Care | Payer: Medicaid Other | Attending: Emergency Medicine | Admitting: Emergency Medicine

## 2021-12-24 DIAGNOSIS — N200 Calculus of kidney: Secondary | ICD-10-CM | POA: Diagnosis not present

## 2021-12-24 DIAGNOSIS — N390 Urinary tract infection, site not specified: Secondary | ICD-10-CM | POA: Diagnosis not present

## 2021-12-24 DIAGNOSIS — Z91040 Latex allergy status: Secondary | ICD-10-CM | POA: Diagnosis not present

## 2021-12-24 DIAGNOSIS — R319 Hematuria, unspecified: Secondary | ICD-10-CM

## 2021-12-24 DIAGNOSIS — R109 Unspecified abdominal pain: Secondary | ICD-10-CM | POA: Diagnosis present

## 2021-12-24 LAB — CBC WITH DIFFERENTIAL/PLATELET
Abs Immature Granulocytes: 0.02 10*3/uL (ref 0.00–0.07)
Basophils Absolute: 0 10*3/uL (ref 0.0–0.1)
Basophils Relative: 1 %
Eosinophils Absolute: 0.1 10*3/uL (ref 0.0–0.5)
Eosinophils Relative: 2 %
HCT: 35 % — ABNORMAL LOW (ref 36.0–46.0)
Hemoglobin: 11.9 g/dL — ABNORMAL LOW (ref 12.0–15.0)
Immature Granulocytes: 0 %
Lymphocytes Relative: 19 %
Lymphs Abs: 1.2 10*3/uL (ref 0.7–4.0)
MCH: 30.7 pg (ref 26.0–34.0)
MCHC: 34 g/dL (ref 30.0–36.0)
MCV: 90.2 fL (ref 80.0–100.0)
Monocytes Absolute: 0.4 10*3/uL (ref 0.1–1.0)
Monocytes Relative: 6 %
Neutro Abs: 4.9 10*3/uL (ref 1.7–7.7)
Neutrophils Relative %: 72 %
Platelets: 257 10*3/uL (ref 150–400)
RBC: 3.88 MIL/uL (ref 3.87–5.11)
RDW: 13.3 % (ref 11.5–15.5)
WBC: 6.7 10*3/uL (ref 4.0–10.5)
nRBC: 0 % (ref 0.0–0.2)

## 2021-12-24 LAB — URINALYSIS, ROUTINE W REFLEX MICROSCOPIC
Bilirubin Urine: NEGATIVE
Glucose, UA: NEGATIVE mg/dL
Ketones, ur: NEGATIVE mg/dL
Leukocytes,Ua: NEGATIVE
Nitrite: NEGATIVE
Protein, ur: 30 mg/dL — AB
Specific Gravity, Urine: 1.015 (ref 1.005–1.030)
pH: 8.5 — ABNORMAL HIGH (ref 5.0–8.0)

## 2021-12-24 LAB — COMPREHENSIVE METABOLIC PANEL
ALT: 28 U/L (ref 0–44)
AST: 15 U/L (ref 15–41)
Albumin: 3.7 g/dL (ref 3.5–5.0)
Alkaline Phosphatase: 92 U/L (ref 38–126)
Anion gap: 9 (ref 5–15)
BUN: 30 mg/dL — ABNORMAL HIGH (ref 6–20)
CO2: 23 mmol/L (ref 22–32)
Calcium: 9.1 mg/dL (ref 8.9–10.3)
Chloride: 103 mmol/L (ref 98–111)
Creatinine, Ser: 1.28 mg/dL — ABNORMAL HIGH (ref 0.44–1.00)
GFR, Estimated: 53 mL/min — ABNORMAL LOW (ref >60)
Glucose, Bld: 130 mg/dL — ABNORMAL HIGH (ref 70–99)
Potassium: 4.6 mmol/L (ref 3.5–5.1)
Sodium: 135 mmol/L (ref 135–145)
Total Bilirubin: 0.5 mg/dL (ref 0.3–1.2)
Total Protein: 7.5 g/dL (ref 6.5–8.1)

## 2021-12-24 LAB — URINALYSIS, MICROSCOPIC (REFLEX)

## 2021-12-24 MED ORDER — ONDANSETRON HCL 4 MG/2ML IJ SOLN
4.0000 mg | Freq: Once | INTRAMUSCULAR | Status: AC
  Administered 2021-12-24: 4 mg via INTRAVENOUS
  Filled 2021-12-24: qty 2

## 2021-12-24 MED ORDER — SODIUM CHLORIDE 0.9 % IV BOLUS
1000.0000 mL | Freq: Once | INTRAVENOUS | Status: AC
  Administered 2021-12-24: 1000 mL via INTRAVENOUS

## 2021-12-24 MED ORDER — HYDROMORPHONE HCL 1 MG/ML IJ SOLN
1.0000 mg | Freq: Once | INTRAMUSCULAR | Status: AC
  Administered 2021-12-24: 1 mg via INTRAVENOUS
  Filled 2021-12-24: qty 1

## 2021-12-24 MED ORDER — KETOROLAC TROMETHAMINE 15 MG/ML IJ SOLN
15.0000 mg | Freq: Once | INTRAMUSCULAR | Status: AC
  Administered 2021-12-24: 15 mg via INTRAVENOUS
  Filled 2021-12-24: qty 1

## 2021-12-24 MED ORDER — ONDANSETRON HCL 4 MG PO TABS
4.0000 mg | ORAL_TABLET | Freq: Three times a day (TID) | ORAL | 0 refills | Status: AC | PRN
  Filled 2021-12-24: qty 20, 7d supply, fill #0

## 2021-12-24 MED ORDER — OXYCODONE-ACETAMINOPHEN 10-325 MG PO TABS
1.0000 | ORAL_TABLET | Freq: Four times a day (QID) | ORAL | 0 refills | Status: DC | PRN
  Filled 2021-12-24: qty 12, 3d supply, fill #0

## 2021-12-24 NOTE — ED Notes (Signed)
ED Provider at bedside. 

## 2021-12-24 NOTE — ED Triage Notes (Signed)
Pt dx kindey stone last week at [facility].  Given pain medication and antibiotic, provided referral for urology, continues to have pain, pressure.  Took 600mg  ibuprofen and oxycodone at 0500 ?

## 2021-12-24 NOTE — ED Provider Notes (Signed)
?[place] EMERGENCY DEPARTMENT ?Provider Note ? ? ?CSN: [id number] ?Arrival date & time: 12/24/21  1029 ? ?  ? ?History ? ?Chief Complaint  ?Patient presents with  ? Flank Pain  ? ? ?Susan Mcpherson is a 46 y.o. female.  Patient presents to the emergency department complaining of right-sided flank pain. ?Patient was diagnosed with a kidney stone on April 26 at [facility].  On April 28 she was evaluated for worsening symptoms including fevers, chills, and ongoing pain.  Urinalysis consistent with infection at that visit.  4 mm stone, distal ureteral, with right-sided hydroureteronephrosis noted on CT abdomen pelvis.  She was discharged home after pain medication with improved pain and the ability to tolerate oral intake. ?Patient continues to complain of pain and pressure.  Patient states that she had 1 day of relief but the pain is back and rates it 10 out of 10.  She endorses flank pain, nausea, vomiting, dysuria, hematuria.  Denies shortness of breath, denies chest pain.  No relevant past medical history ? ?HPI ? ?  ? ?Home Medications ?Prior to Admission medications   ?Medication Sig Start Date End Date Taking? Authorizing Provider  ?ondansetron (ZOFRAN) 4 MG tablet Take 1 tablet (4 mg total) by mouth every 8 (eight) hours as needed for nausea or vomiting. 12/24/21  Yes Dorothyann Peng, PA-C  ?oxyCODONE-acetaminophen (PERCOCET) 10-325 MG tablet Take 1 tablet by mouth every 6 (six) hours as needed for up to 3 days for pain. 12/24/21 12/27/21 Yes Dorothyann Peng, PA-C  ?acetaminophen (TYLENOL) 325 MG tablet Take 2 tablets (650 mg total) by mouth every 6 (six) hours as needed. 12/20/21   Jeanell Sparrow, DO  ?cefUROXime (CEFTIN) 500 MG tablet Take 1 tablet (500 mg total) by mouth 2 (two) times daily with a meal for 7 days. 12/20/21 12/27/21  Wynona Dove A, DO  ?COPPER PO 1 Device by Intrauterine route once.  12/26/16   [provider]  ?EMGALITY 120 MG/ML SOAJ Inject 120 mg into the skin  every 28 (twenty-eight) days. 03/28/18   [provider]  ?ibuprofen (ADVIL) 600 MG tablet Take 1 tablet (600 mg total) by mouth every 6 (six) hours as needed. 12/20/21   Jeanell Sparrow, DO  ?lamoTRIgine (LAMICTAL) 25 MG tablet Take 25 mg by mouth QID.    [provider]  ?levETIRAcetam (KEPPRA) 500 MG tablet Take 500 mg by mouth 2 (two) times daily.    [provider]  ?meloxicam (MOBIC) 15 MG tablet Take 1 tablet (15 mg total) by mouth daily. ?Patient taking differently: Take 15 mg by mouth as needed. 05/28/21   Glennon Mac, DO  ?ondansetron (ZOFRAN) 4 MG tablet Take 1 tablet (4 mg total) by mouth every 8 (eight) hours as needed for nausea or vomiting. 12/01/21   Tegeler, Gwenyth Allegra, MD  ?tamsulosin (FLOMAX) 0.4 MG CAPS capsule Take 1 capsule (0.4 mg total) by mouth daily for 14 days. 12/20/21 01/03/22  Jeanell Sparrow, DO  ?   ? ?Allergies    ?Sumatriptan, Cinnamon, Codeine, and Latex   ? ?Review of Systems   ?Review of Systems  ?Constitutional:  Negative for fever.  ?Respiratory:  Negative for shortness of breath.   ?Cardiovascular:  Negative for chest pain.  ?Gastrointestinal:  Positive for abdominal pain, nausea and vomiting.  ?Genitourinary:  Positive for dysuria, flank pain and hematuria.  ? ?Physical Exam ?Updated Vital Signs ?BP 95/61   Pulse 62   Temp 98 ?  F (36.7 ?C) (Oral)   Resp 16   Ht 5' (1.524 m)   Wt 67 kg   LMP 12/01/2021 Comment: neg upreg in er today.  SpO2 94%   BMI 28.85 kg/m?  ?Physical Exam ?Vitals and nursing note reviewed.  ?Constitutional:   ?   Appearance: She is normal weight.  ?   Comments: Patient tearful at first assessment  ?HENT:  ?   Head: Normocephalic and atraumatic.  ?Eyes:  ?   Conjunctiva/sclera: Conjunctivae normal.  ?Cardiovascular:  ?   Rate and Rhythm: Normal rate and regular rhythm.  ?   Pulses: Normal pulses.  ?   Heart sounds: Normal heart sounds.  ?Pulmonary:  ?   Effort: Pulmonary effort is normal.  ?   Breath sounds: Normal breath  sounds.  ?Abdominal:  ?   General: There is no distension.  ?   Palpations: Abdomen is soft.  ?   Tenderness: There is right CVA tenderness. There is no left CVA tenderness.  ?Musculoskeletal:  ?   Cervical back: Normal range of motion.  ?Skin: ?   General: Skin is warm and dry.  ?   Capillary Refill: Capillary refill takes less than 2 seconds.  ?Neurological:  ?   Mental Status: She is alert and oriented to person, place, and time.  ? ? ?ED Results / Procedures / Treatments   ?Labs ?(all labs ordered are listed, but only abnormal results are displayed) ?Labs Reviewed  ?URINALYSIS, ROUTINE W REFLEX MICROSCOPIC - Abnormal; Notable for the following components:  ?    Result Value  ? pH 8.5 (*)   ? Hgb urine dipstick MODERATE (*)   ? Protein, ur 30 (*)   ? All other components within normal limits  ?COMPREHENSIVE METABOLIC PANEL - Abnormal; Notable for the following components:  ? Glucose, Bld 130 (*)   ? BUN 30 (*)   ? Creatinine, Ser 1.28 (*)   ? GFR, Estimated 53 (*)   ? All other components within normal limits  ?CBC WITH DIFFERENTIAL/PLATELET - Abnormal; Notable for the following components:  ? Hemoglobin 11.9 (*)   ? HCT 35.0 (*)   ? All other components within normal limits  ?URINALYSIS, MICROSCOPIC (REFLEX) - Abnormal; Notable for the following components:  ? Bacteria, UA RARE (*)   ? All other components within normal limits  ?URINE CULTURE  ? ? ?EKG ?None ? ?Radiology ?No results found. ? ?Procedures ?Procedures  ? ? ?Medications Ordered in ED ?Medications  ?sodium chloride 0.9 % bolus 1,000 mL (0 mLs Intravenous Stopped 12/24/21 1258)  ?ondansetron (ZOFRAN) injection 4 mg (4 mg Intravenous Given 12/24/21 1102)  ?ketorolac (TORADOL) 15 MG/ML injection 15 mg (15 mg Intravenous Given 12/24/21 1103)  ?HYDROmorphone (DILAUDID) injection 1 mg (1 mg Intravenous Given 12/24/21 1111)  ? ? ?ED Course/ Medical Decision Making/ A&P ?  ?                        ?Medical Decision Making ?Amount and/or Complexity of Data  Reviewed ?Labs: ordered. ? ?Risk ?Prescription drug management. ? ? ?This patient presents to the ED for concern of right sided flank pain, this involves an extensive number of treatment options, and is a complaint that carries with it a high risk of complications and morbidity.  The differential diagnosis includes but is not limited to nephrolithiasis, pyelonephritis, urinary tract infection, and others ? ? ?Co morbidities that complicate the patient evaluation ? ?Current UTI with nephrolithiasis ? ? ?  Additional history obtained: ? ? ?External records from outside source obtained and reviewed including notes from recent emergency department visits including visits on April 26 and April 28 ? ? ?Lab Tests: ? ?I Ordered, and personally interpreted labs.  The pertinent results include: Hemoglobin 11.9 consistent with previous values, creatinine 1.28, increased from 0.85 4 days ago, urinalysis with moderate hemoglobin, pH 8.5, 30 protein, and rare bacteria, urine culture pending ? ? ?Imaging Studies ordered: ? ?I considered a repeat CT study of the patient today but felt that it would not change patient care ? ? ? ?Problem List / ED Course / Critical interventions / Medication management ? ? ?I ordered medication including Zofran for nausea, normal saline for hydration, Toradol and Dilaudid for pain ?Reevaluation of the patient after these medicines showed that the patient improved ?I have reviewed the patients home medicines and have made adjustments as needed ? ? ?Test / Admission - Considered: ? ?The patient's pain and nausea improved greatly with medication administration.  It sounds as if the stone may be traversing the urinary symptoms at this time with the patient recently having a brief period of relief.  I discussed discharge home with the patient.  The patient would like to discharge home with this time with pain medication and nausea medication.  I believe this is reasonable since the patient seems to have  improved.  If the patient's pain worsens and she is unable to tolerate it at home, we may have to consider admission for pain control.  Discharge home at this time ? ?Final Clinical Impression(s) / ED Diagnoses ?Final diagnos

## 2021-12-24 NOTE — ED Notes (Signed)
Pt drove here, but states able to get a ride home after medication ?

## 2021-12-24 NOTE — Discharge Instructions (Addendum)
You were seen today for recurrent pain and nausea from a kidney stone.  As discussed, I have increased her pain medication and refilled nausea medication for discharge home.  He will discontinue the previously prescribed oxycodone as the one I have prescribed a stronger.  Do not drive when taking the medication.  If pain becomes unbearable, return to the emergency department for further evaluation.  Recommend follow-up with urology as previously discussed at previous appointment ?

## 2021-12-25 LAB — URINE CULTURE: Culture: <10000 — AB

## 2021-12-27 ENCOUNTER — Inpatient Hospital Stay (HOSPITAL_BASED_OUTPATIENT_CLINIC_OR_DEPARTMENT_OTHER)
Admission: EM | Admit: 2021-12-27 | Discharge: 2021-12-30 | DRG: 853 | Disposition: A | Discharge status: Home / Self Care | Payer: Medicaid Other | Attending: Internal Medicine | Admitting: Internal Medicine

## 2021-12-27 ENCOUNTER — Encounter (HOSPITAL_COMMUNITY)
Admission: EM | Disposition: A | Discharge status: Home / Self Care | Payer: Self-pay | Source: Home / Self Care | Attending: Family Medicine

## 2021-12-27 ENCOUNTER — Encounter (HOSPITAL_BASED_OUTPATIENT_CLINIC_OR_DEPARTMENT_OTHER): Payer: Self-pay | Admitting: Emergency Medicine

## 2021-12-27 ENCOUNTER — Inpatient Hospital Stay (HOSPITAL_COMMUNITY): Payer: Medicaid Other | Admitting: Anesthesiology

## 2021-12-27 ENCOUNTER — Inpatient Hospital Stay (HOSPITAL_COMMUNITY): Payer: Medicaid Other

## 2021-12-27 ENCOUNTER — Emergency Department (HOSPITAL_BASED_OUTPATIENT_CLINIC_OR_DEPARTMENT_OTHER): Payer: Medicaid Other

## 2021-12-27 ENCOUNTER — Other Ambulatory Visit: Payer: Self-pay

## 2021-12-27 DIAGNOSIS — R61 Generalized hyperhidrosis: Secondary | ICD-10-CM | POA: Diagnosis not present

## 2021-12-27 DIAGNOSIS — N132 Hydronephrosis with renal and ureteral calculous obstruction: Secondary | ICD-10-CM | POA: Diagnosis present

## 2021-12-27 DIAGNOSIS — Q6211 Congenital occlusion of ureteropelvic junction: Secondary | ICD-10-CM

## 2021-12-27 DIAGNOSIS — E8770 Fluid overload, unspecified: Secondary | ICD-10-CM | POA: Diagnosis not present

## 2021-12-27 DIAGNOSIS — Z79899 Other long term (current) drug therapy: Secondary | ICD-10-CM

## 2021-12-27 DIAGNOSIS — N202 Calculus of kidney with calculus of ureter: Secondary | ICD-10-CM | POA: Diagnosis present

## 2021-12-27 DIAGNOSIS — D649 Anemia, unspecified: Secondary | ICD-10-CM

## 2021-12-27 DIAGNOSIS — A4159 Other Gram-negative sepsis: Principal | ICD-10-CM | POA: Diagnosis present

## 2021-12-27 DIAGNOSIS — Z791 Long term (current) use of non-steroidal anti-inflammatories (NSAID): Secondary | ICD-10-CM | POA: Diagnosis not present

## 2021-12-27 DIAGNOSIS — D72829 Elevated white blood cell count, unspecified: Secondary | ICD-10-CM | POA: Diagnosis not present

## 2021-12-27 DIAGNOSIS — Z91040 Latex allergy status: Secondary | ICD-10-CM

## 2021-12-27 DIAGNOSIS — Z885 Allergy status to narcotic agent status: Secondary | ICD-10-CM

## 2021-12-27 DIAGNOSIS — E871 Hypo-osmolality and hyponatremia: Secondary | ICD-10-CM | POA: Diagnosis present

## 2021-12-27 DIAGNOSIS — G40909 Epilepsy, unspecified, not intractable, without status epilepticus: Secondary | ICD-10-CM | POA: Diagnosis present

## 2021-12-27 DIAGNOSIS — R000 Tachycardia, unspecified: Secondary | ICD-10-CM | POA: Diagnosis not present

## 2021-12-27 DIAGNOSIS — Z8249 Family history of ischemic heart disease and other diseases of the circulatory system: Secondary | ICD-10-CM

## 2021-12-27 DIAGNOSIS — G43009 Migraine without aura, not intractable, without status migrainosus: Secondary | ICD-10-CM

## 2021-12-27 DIAGNOSIS — G9341 Metabolic encephalopathy: Secondary | ICD-10-CM | POA: Diagnosis present

## 2021-12-27 DIAGNOSIS — R569 Unspecified convulsions: Secondary | ICD-10-CM | POA: Diagnosis not present

## 2021-12-27 DIAGNOSIS — N201 Calculus of ureter: Secondary | ICD-10-CM | POA: Diagnosis not present

## 2021-12-27 DIAGNOSIS — D6859 Other primary thrombophilia: Secondary | ICD-10-CM | POA: Diagnosis present

## 2021-12-27 DIAGNOSIS — N179 Acute kidney failure, unspecified: Secondary | ICD-10-CM

## 2021-12-27 DIAGNOSIS — B952 Enterococcus as the cause of diseases classified elsewhere: Secondary | ICD-10-CM | POA: Diagnosis present

## 2021-12-27 DIAGNOSIS — N136 Pyonephrosis: Secondary | ICD-10-CM | POA: Diagnosis present

## 2021-12-27 HISTORY — PX: CYSTOSCOPY W/ URETERAL STENT PLACEMENT: SHX1429

## 2021-12-27 LAB — URINALYSIS, ROUTINE W REFLEX MICROSCOPIC
Bilirubin Urine: NEGATIVE
Glucose, UA: NEGATIVE mg/dL
Ketones, ur: NEGATIVE mg/dL
Nitrite: NEGATIVE
Protein, ur: 30 mg/dL — AB
Specific Gravity, Urine: 1.015 (ref 1.005–1.030)
pH: 7 (ref 5.0–8.0)

## 2021-12-27 LAB — URINALYSIS, MICROSCOPIC (REFLEX)

## 2021-12-27 LAB — CBC
HCT: 32.8 % — ABNORMAL LOW (ref 36.0–46.0)
Hemoglobin: 11.2 g/dL — ABNORMAL LOW (ref 12.0–15.0)
MCH: 31 pg (ref 26.0–34.0)
MCHC: 34.1 g/dL (ref 30.0–36.0)
MCV: 90.9 fL (ref 80.0–100.0)
Platelets: 261 10*3/uL (ref 150–400)
RBC: 3.61 MIL/uL — ABNORMAL LOW (ref 3.87–5.11)
RDW: 13.4 % (ref 11.5–15.5)
WBC: 19.1 10*3/uL — ABNORMAL HIGH (ref 4.0–10.5)
nRBC: 0 % (ref 0.0–0.2)

## 2021-12-27 LAB — LACTIC ACID, PLASMA: Lactic Acid, Venous: 1.6 mmol/L (ref 0.5–1.9)

## 2021-12-27 LAB — BASIC METABOLIC PANEL
Anion gap: 12 (ref 5–15)
BUN: 31 mg/dL — ABNORMAL HIGH (ref 6–20)
CO2: 24 mmol/L (ref 22–32)
Calcium: 9 mg/dL (ref 8.9–10.3)
Chloride: 98 mmol/L (ref 98–111)
Creatinine, Ser: 1.91 mg/dL — ABNORMAL HIGH (ref 0.44–1.00)
GFR, Estimated: 33 mL/min — ABNORMAL LOW (ref >60)
Glucose, Bld: 142 mg/dL — ABNORMAL HIGH (ref 70–99)
Potassium: 4.8 mmol/L (ref 3.5–5.1)
Sodium: 134 mmol/L — ABNORMAL LOW (ref 135–145)

## 2021-12-27 LAB — SURGICAL PCR SCREEN
MRSA, PCR: NEGATIVE
Staphylococcus aureus: NEGATIVE

## 2021-12-27 LAB — HCG, SERUM, QUALITATIVE: Preg, Serum: NEGATIVE

## 2021-12-27 SURGERY — CYSTOSCOPY, WITH RETROGRADE PYELOGRAM AND URETERAL STENT INSERTION
Anesthesia: General | Site: Ureter | Laterality: Right

## 2021-12-27 MED ORDER — PHENYLEPHRINE HCL (PRESSORS) 10 MG/ML IV SOLN
INTRAVENOUS | Status: AC
  Filled 2021-12-27: qty 1

## 2021-12-27 MED ORDER — SODIUM CHLORIDE 0.9 % IV SOLN
2.0000 g | Freq: Once | INTRAVENOUS | Status: AC
  Administered 2021-12-27: 2 g via INTRAVENOUS
  Filled 2021-12-27: qty 20

## 2021-12-27 MED ORDER — IOHEXOL 300 MG/ML  SOLN
INTRAMUSCULAR | Status: DC | PRN
  Administered 2021-12-27: 5 mL via URETHRAL

## 2021-12-27 MED ORDER — KETOROLAC TROMETHAMINE 15 MG/ML IJ SOLN
15.0000 mg | Freq: Once | INTRAMUSCULAR | Status: AC
  Administered 2021-12-27: 15 mg via INTRAVENOUS
  Filled 2021-12-27: qty 1

## 2021-12-27 MED ORDER — SODIUM CHLORIDE 0.9 % IR SOLN
Status: DC | PRN
  Administered 2021-12-27: 1000 mL

## 2021-12-27 MED ORDER — FENTANYL CITRATE (PF) 250 MCG/5ML IJ SOLN
INTRAMUSCULAR | Status: DC | PRN
  Administered 2021-12-27: 50 ug via INTRAVENOUS

## 2021-12-27 MED ORDER — TAMSULOSIN HCL 0.4 MG PO CAPS
0.4000 mg | ORAL_CAPSULE | Freq: Every day | ORAL | Status: DC
Start: 2021-12-28 — End: 2021-12-27

## 2021-12-27 MED ORDER — MIDAZOLAM HCL 2 MG/2ML IJ SOLN
INTRAMUSCULAR | Status: AC
  Filled 2021-12-27: qty 2

## 2021-12-27 MED ORDER — PHENYLEPHRINE HCL (PRESSORS) 10 MG/ML IV SOLN
INTRAVENOUS | Status: DC | PRN
  Administered 2021-12-27: 160 ug via INTRAVENOUS
  Administered 2021-12-27: 80 ug via INTRAVENOUS

## 2021-12-27 MED ORDER — MIDAZOLAM HCL 5 MG/5ML IJ SOLN
INTRAMUSCULAR | Status: DC | PRN
  Administered 2021-12-27: 2 mg via INTRAVENOUS

## 2021-12-27 MED ORDER — SODIUM CHLORIDE 0.9% FLUSH
3.0000 mL | Freq: Two times a day (BID) | INTRAVENOUS | Status: DC
  Administered 2021-12-29 – 2021-12-30 (×3): 3 mL via INTRAVENOUS

## 2021-12-27 MED ORDER — LIDOCAINE 2% (20 MG/ML) 5 ML SYRINGE
INTRAMUSCULAR | Status: DC | PRN
  Administered 2021-12-27: 60 mg via INTRAVENOUS

## 2021-12-27 MED ORDER — MUPIROCIN 2 % EX OINT
1.0000 "application " | TOPICAL_OINTMENT | Freq: Two times a day (BID) | CUTANEOUS | Status: DC
  Administered 2021-12-28 – 2021-12-30 (×4): 1 via NASAL

## 2021-12-27 MED ORDER — PHENYLEPHRINE HCL-NACL 20-0.9 MG/250ML-% IV SOLN
INTRAVENOUS | Status: DC | PRN
  Administered 2021-12-27: 70 ug/min via INTRAVENOUS

## 2021-12-27 MED ORDER — DEXAMETHASONE SODIUM PHOSPHATE 10 MG/ML IJ SOLN
INTRAMUSCULAR | Status: DC | PRN
  Administered 2021-12-27: 4 mg via INTRAVENOUS

## 2021-12-27 MED ORDER — ONDANSETRON HCL 4 MG/2ML IJ SOLN
INTRAMUSCULAR | Status: AC
Start: 2021-12-27 — End: ?
  Filled 2021-12-27: qty 2

## 2021-12-27 MED ORDER — SODIUM CHLORIDE 0.9 % IV BOLUS
1000.0000 mL | Freq: Once | INTRAVENOUS | Status: AC
Start: 2021-12-28 — End: 2021-12-28
  Administered 2021-12-28: 1000 mL via INTRAVENOUS

## 2021-12-27 MED ORDER — PROPOFOL 10 MG/ML IV BOLUS
INTRAVENOUS | Status: AC
  Filled 2021-12-27: qty 20

## 2021-12-27 MED ORDER — FENTANYL CITRATE PF 50 MCG/ML IJ SOSY
100.0000 ug | PREFILLED_SYRINGE | Freq: Once | INTRAMUSCULAR | Status: AC
Start: 2021-12-27 — End: 2021-12-27
  Administered 2021-12-27: 100 ug via INTRAVENOUS
  Filled 2021-12-27: qty 2

## 2021-12-27 MED ORDER — HYDROMORPHONE HCL 1 MG/ML IJ SOLN: 1.0000 mg | Freq: Once | INTRAMUSCULAR | Status: DC

## 2021-12-27 MED ORDER — ONDANSETRON HCL 4 MG/2ML IJ SOLN
4.0000 mg | Freq: Four times a day (QID) | INTRAMUSCULAR | Status: DC | PRN
  Administered 2021-12-29: 4 mg via INTRAVENOUS
  Filled 2021-12-27: qty 2

## 2021-12-27 MED ORDER — ALBUMIN HUMAN 5 % IV SOLN
INTRAVENOUS | Status: AC
  Filled 2021-12-27: qty 250

## 2021-12-27 MED ORDER — SODIUM CHLORIDE 0.9 % IR SOLN
Status: DC | PRN
  Administered 2021-12-27: 3000 mL

## 2021-12-27 MED ORDER — FENTANYL CITRATE (PF) 100 MCG/2ML IJ SOLN
INTRAMUSCULAR | Status: AC
  Filled 2021-12-27: qty 2

## 2021-12-27 MED ORDER — ALBUMIN HUMAN 5 % IV SOLN
12.5000 g | Freq: Once | INTRAVENOUS | Status: AC
  Administered 2021-12-27: 12.5 g via INTRAVENOUS

## 2021-12-27 MED ORDER — FENTANYL CITRATE PF 50 MCG/ML IJ SOSY
50.0000 ug | PREFILLED_SYRINGE | Freq: Once | INTRAMUSCULAR | Status: AC
  Administered 2021-12-27: 50 ug via INTRAVENOUS
  Filled 2021-12-27: qty 1

## 2021-12-27 MED ORDER — SODIUM CHLORIDE 0.9 % IV BOLUS
500.0000 mL | Freq: Once | INTRAVENOUS | Status: AC
  Administered 2021-12-27: 500 mL via INTRAVENOUS

## 2021-12-27 MED ORDER — PROPOFOL 10 MG/ML IV BOLUS
INTRAVENOUS | Status: DC | PRN
  Administered 2021-12-27: 120 mg via INTRAVENOUS

## 2021-12-27 MED ORDER — ACETAMINOPHEN 325 MG PO TABS: 650.0000 mg | ORAL_TABLET | Freq: Four times a day (QID) | ORAL | Status: DC | PRN

## 2021-12-27 MED ORDER — SODIUM CHLORIDE 0.9 % IV BOLUS: 500.0000 mL | Freq: Once | INTRAVENOUS | Status: DC

## 2021-12-27 MED ORDER — FENTANYL CITRATE PF 50 MCG/ML IJ SOSY
50.0000 ug | PREFILLED_SYRINGE | Freq: Once | INTRAMUSCULAR | Status: AC
Start: 2021-12-27 — End: 2021-12-27
  Administered 2021-12-27: 50 ug via INTRAVENOUS
  Filled 2021-12-27: qty 1

## 2021-12-27 MED ORDER — ACETAMINOPHEN 500 MG PO TABS
1000.0000 mg | ORAL_TABLET | Freq: Once | ORAL | Status: AC
Start: 2021-12-27 — End: 2021-12-27
  Administered 2021-12-27: 1000 mg via ORAL
  Filled 2021-12-27: qty 2

## 2021-12-27 MED ORDER — ONDANSETRON HCL 4 MG/2ML IJ SOLN
4.0000 mg | Freq: Once | INTRAMUSCULAR | Status: AC
  Administered 2021-12-27: 4 mg via INTRAVENOUS
  Filled 2021-12-27: qty 2

## 2021-12-27 MED ORDER — SODIUM CHLORIDE 0.9 % IV BOLUS
1000.0000 mL | Freq: Once | INTRAVENOUS | Status: AC
  Administered 2021-12-27: 1000 mL via INTRAVENOUS

## 2021-12-27 MED ORDER — ACETAMINOPHEN 650 MG RE SUPP: 650.0000 mg | Freq: Four times a day (QID) | RECTAL | Status: DC | PRN

## 2021-12-27 MED ORDER — SODIUM CHLORIDE 0.9 % IV SOLN: 2.0000 g | INTRAVENOUS | Status: DC

## 2021-12-27 MED ORDER — SODIUM CHLORIDE 0.9 % IV SOLN: INTRAVENOUS | Status: AC

## 2021-12-27 MED ORDER — HYDROMORPHONE HCL 1 MG/ML IJ SOLN
0.5000 mg | INTRAMUSCULAR | Status: DC | PRN
Start: 2021-12-27 — End: 2021-12-30
  Administered 2021-12-28: 0.5 mg via INTRAVENOUS
  Filled 2021-12-27: qty 1
  Filled 2021-12-27: qty 0.5

## 2021-12-27 MED ORDER — ONDANSETRON HCL 4 MG/2ML IJ SOLN
INTRAMUSCULAR | Status: DC | PRN
  Administered 2021-12-27: 4 mg via INTRAVENOUS

## 2021-12-27 SURGICAL SUPPLY — 26 items
BAG URO CATCHER STRL LF (MISCELLANEOUS) ×2 IMPLANT
BASKET ZERO TIP NITINOL 2.4FR (BASKET) IMPLANT
CATH URETL OPEN 5X70 (CATHETERS) ×2 IMPLANT
CLOTH BEACON ORANGE TIMEOUT ST (SAFETY) ×2 IMPLANT
EXTRACTOR STONE 1.7FRX115CM (UROLOGICAL SUPPLIES) IMPLANT
GLOVE BIO SURGEON STRL SZ 6.5 (GLOVE) ×1 IMPLANT
GLOVE BIO SURGEON STRL SZ8 (GLOVE) ×1 IMPLANT
GLOVE BIOGEL PI IND STRL 7.0 (GLOVE) IMPLANT
GLOVE BIOGEL PI IND STRL 8 (GLOVE) IMPLANT
GLOVE BIOGEL PI INDICATOR 7.0 (GLOVE) ×1
GLOVE BIOGEL PI INDICATOR 8 (GLOVE) ×1
GLOVE SURG LX 7.5 STRW (GLOVE) ×1
GLOVE SURG LX STRL 7.5 STRW (GLOVE) ×1 IMPLANT
GOWN STRL REUS W/ TWL XL LVL3 (GOWN DISPOSABLE) ×1 IMPLANT
GOWN STRL REUS W/TWL XL LVL3 (GOWN DISPOSABLE) ×6
GUIDEWIRE ANG ZIPWIRE 038X150 (WIRE) IMPLANT
GUIDEWIRE STR DUAL SENSOR (WIRE) ×2 IMPLANT
MANIFOLD NEPTUNE II (INSTRUMENTS) ×2 IMPLANT
PACK CYSTO (CUSTOM PROCEDURE TRAY) ×2 IMPLANT
SHEATH NAVIGATOR HD 11/13X28 (SHEATH) IMPLANT
SHEATH NAVIGATOR HD 11/13X36 (SHEATH) IMPLANT
STENT URET 6FRX24 CONTOUR (STENTS) ×1 IMPLANT
TRAY FOL W/BAG SLVR 16FR STRL (SET/KITS/TRAYS/PACK) IMPLANT
TRAY FOLEY W/BAG SLVR 16FR LF (SET/KITS/TRAYS/PACK) ×2
TUBING CONNECTING 10 (TUBING) ×2 IMPLANT
TUBING UROLOGY SET (TUBING) ×2 IMPLANT

## 2021-12-27 NOTE — Anesthesia Preprocedure Evaluation (Addendum)
Anesthesia Evaluation  ?Patient identified by MRN, date of birth, ID band ?Patient awake ? ? ? ?Reviewed: ?Allergy & Precautions, NPO status , Patient's Chart, lab work & pertinent test results ? ?History of Anesthesia Complications ?Negative for: history of anesthetic complications ? ?Airway ?Mallampati: II ? ?TM Distance: >3 FB ?Neck ROM: Full ? ? ? Dental ? ?(+) Dental Advisory Given ?  ?Pulmonary ?neg pulmonary ROS,  ?  ?Pulmonary exam normal ? ? ? ? ? ? ? Cardiovascular ?negative cardio ROS ?Normal cardiovascular exam ? ? ?  ?Neuro/Psych ? Headaches, Seizures -, Well Controlled,  negative psych ROS  ? GI/Hepatic ?negative GI ROS, Neg liver ROS,   ?Endo/Other  ?negative endocrine ROS ? Renal/GU ?ARFRenal disease ?RIGHT INFECTED OBSTRUCTING STONE  ? ?  ?Musculoskeletal ?negative musculoskeletal ROS ?(+)  ? Abdominal ?  ?Peds ? Hematology ? ?(+) Blood dyscrasia, anemia ,  ?Protein S deficiency ?   ?Anesthesia Other Findings ? ? Reproductive/Obstetrics ? ?  ? ? ? ? ? ? ? ? ? ? ? ? ? ?  ?  ? ? ? ? ? ? ? ? ?Anesthesia Physical ?Anesthesia Plan ? ?ASA: 2 and emergent ? ?Anesthesia Plan: General  ? ?Post-op Pain Management: Tylenol PO (pre-op)*  ? ?Induction: Intravenous ? ?PONV Risk Score and Plan: 3 and Treatment may vary due to age or medical condition, Ondansetron, Dexamethasone and Midazolam ? ?Airway Management Planned: LMA ? ?Additional Equipment: None ? ?Intra-op Plan:  ? ?Post-operative Plan: Extubation in OR ? ?Informed Consent: I have reviewed the patients History and Physical, chart, labs and discussed the procedure including the risks, benefits and alternatives for the proposed anesthesia with the patient or authorized representative who has indicated his/her understanding and acceptance.  ? ? ? ?Dental advisory given ? ?Plan Discussed with: CRNA and Anesthesiologist ? ?Anesthesia Plan Comments:   ? ? ? ? ? ? ?Anesthesia Quick Evaluation ? ?

## 2021-12-27 NOTE — ED Notes (Signed)
[facility] at bedside 

## 2021-12-27 NOTE — Op Note (Addendum)
Preoperative diagnosis:  ?Right ureteral stone   ? ?Postoperative diagnosis:  ?Right ureteral stone   ? ?Procedure: ? ?Cystoscopy ?Right ureteral stent placement (6Fr x 24cm)  ?Right retrograde pyelography with interpretation  ? ?Surgeon: Cordella Register M.D. ? ?Resident: Judson Roch MD ? ?Anesthesia: General ? ?Complications: None ? ?Intraoperative findings:  ?-Normal urethra ?-Bladder without lesions, masses or stones ?-Bilateral orthotopic ureteral orifices ?-Proximal J hook of right proximal ureter ?-Successful right stent placement. Purulent efflux after wire placement  ?-Foley catheter placement at the end of the case  ? ?EBL: Minimal ? ?Specimens: None ? ?Indication: Susan Mcpherson is a 47 y.o. patient with right UVJ stone with leukocytosis to 19K and fever to 39.5C. After reviewing the management options for treatment, he elected to proceed with the above surgical procedure(s). We have discussed the potential benefits and risks of the procedure, side effects of the proposed treatment, the likelihood of the patient achieving the goals of the procedure, and any potential problems that might occur during the procedure or recuperation. Informed consent has been obtained. ? ?Description of procedure: ? ?The patient was taken to the operating room and general anesthesia was induced.  The patient was placed in the dorsal lithotomy position, prepped and draped in the usual sterile fashion, and preoperative antibiotics were administered. A preoperative time-out was performed.  ? ?Cystourethroscopy was performed.  The patient?s urethra was examined. The bladder was then systematically examined in its entirety. There was no evidence for any bladder tumors, stones, or other mucosal pathology.   ? ?Attention then turned to the right ureteral orifice and a ureteral catheter was used to intubate the ureteral orifice.  Omnipaque contrast was injected through the ureteral catheter and a retrograde pyelogram was performed with  moderate hydronephrosis and J hook of the proximal ureter. ? ?A 0.38 sensor guidewire was then advanced up the right ureter into the renal pelvis under fluoroscopic guidance. There was J hooking of the right proximal ureter which required some manipulation of the wire to advance into the renal pelvis.   A ureteral stent was advance over the wire using Seldinger technique.  The stent was positioned appropriately under fluoroscopic and cystoscopic guidance.  The wire was then removed with an adequate stent curl noted in the renal pelvis as well as in the bladder. ? ?The bladder was left full and a 16Fr catheter was placed.  The patient appeared to tolerate the procedure well and without complications.  The patient was able to be awakened and transferred to the recovery unit in satisfactory condition.  ? ?Plan:  ?-Continue broad spectrum antibiotics while urine cultures pend ?-Continue foley until POD1  ?-Patient will need outpatient definitive stone management in 2-3 weeks ? ?

## 2021-12-27 NOTE — Consult Note (Signed)
Urology Consult Note  ? ?Requesting Attending Physician:  Marcelyn Bruins, MD ?Service Providing Consult: Urology  ? ?Reason for Consult:  Nephrolithiasis ? ?HPI: JENEA KOHORST is seen in consultation  at the request of Marcelyn Bruins, MD for evaluation of right ureteral stone.  ? ?Patient presented to the [facility] ED today for the third time in the last week due to right sided flank pain with subjective fevers and chills at home. She was initially diagnosed with 71mm right distal ureteral stone on 4/28 with plans for outpatient urology follow up. Repeat CT imaging today shows 51mm right distal ureteral stone which has now migrated to the right UVJ. Labwork notable for leukocytosis to 19 and creatinine of 1.9 from a baseline of 1.0. UA today with few leukocytes, no nitrites, many bacteria. Two prior urine cultures from 4/28 and 5/2 were negative. She has been on empiric ceftin for the last 6 days.  ? ?On arrival to [facility] patient was initially afebrile with normal vitals HR88 and BP 124/67. On bedside evaluation the patient became confused, diaphoretic and tachycardic. Repeat temperature was 39.5C. Patient reports no prior hx of nephrolithiasis. History limited by patient's symptoms during initial evaluation.  ? ?Past Medical History: ?Past Medical History:  ?Diagnosis Date  ? Ectopic pregnancy   ? Migraine   ? Seizures ([place])   ? not on any medications per MD, last seizure was sometime in 2017  ? ? ?Past Surgical History:  ?Past Surgical History:  ?Procedure Laterality Date  ? Gyn surgery    ? HERNIA REPAIR    ? KIDNEY STONE SURGERY Right   ? Lumps in rt breast    ? RHINOPLASTY    ? SHOULDER ARTHROSCOPY WITH ROTATOR CUFF REPAIR Right 04/22/2018  ? Procedure: Right shoulder arthroscopy with subacromial decompression, distal clavicle resection, rotator cuff repair;  Surgeon: Justice Britain, MD;  Location: [facility];  Service: Orthopedics;  Laterality: Right;  143min   ? ? ?Medication: ?Current Facility-Administered Medications  ?Medication Dose Route Frequency Provider Last Rate Last Admin  ? 0.9 %  sodium chloride infusion   Intravenous Continuous Marcelyn Bruins, MD 125 mL/hr at 12/27/21 1915 New Bag at 12/27/21 1915  ? acetaminophen (TYLENOL) tablet 650 mg  650 mg Oral Q6H PRN Marcelyn Bruins, MD      ? Or  ? acetaminophen (TYLENOL) suppository 650 mg  650 mg Rectal Q6H PRN Marcelyn Bruins, MD      ? Derrill Memo ON 12/28/2021] cefTRIAXone (ROCEPHIN) 2 g in sodium chloride 0.9 % 100 mL IVPB  2 g Intravenous Q24H Marcelyn Bruins, MD      ? HYDROmorphone (DILAUDID) injection 0.5 mg  0.5 mg Intravenous Q4H PRN Marcelyn Bruins, MD      ? ondansetron [facility]) injection 4 mg  4 mg Intravenous Q6H PRN Marcelyn Bruins, MD      ? sodium chloride flush (NS) 0.9 % injection 3 mL  3 mL Intravenous Q12H Marcelyn Bruins, MD      ? ? ?Allergies: ?Allergies  ?Allergen Reactions  ? Sumatriptan Anaphylaxis and Swelling  ?  Throat swelling/burning- "I can take the pill.  Not the injection"  ? Cinnamon Swelling and Other (See Comments)  ?  Tongue to swell & mouth sores  ? Codeine Rash  ? Latex Rash  ? ? ?Social History: ?Social History  ? ?Tobacco Use  ? Smoking status: Never  ? Smokeless tobacco: Never  ?Vaping Use  ?  Vaping Use: Never used  ?Substance Use Topics  ? Alcohol use: No  ? Drug use: No  ? ? ?Family History ?Family History  ?Problem Relation Age of Onset  ? Hypertension Father   ? ? ?Review of Systems ?10 systems were reviewed and are negative except as noted specifically in the HPI. ? ?Objective  ? ?Vital signs in last 24 hours: ?BP 124/67 (BP Location: Left Arm)   Pulse 88   Temp 99.3 ?F (37.4 ?C) (Oral)   Resp 20   Ht 5' (1.524 m)   Wt 65.3 kg   LMP 12/01/2021 Comment: neg upreg in er today.  SpO2 99%   BMI 28.12 kg/m?  ? ?Physical Exam ?General: Uncomfortable and diaphoretic  ?HEENT: [place]/AT, EOMI, MMM ?Pulmonary: Normal work of  breathing ?Cardiovascular: HDS, adequate peripheral perfusion ?Abdomen: Soft, NTTP, nondistended. ?GU: Voiding spontaneously, mild right CVA tenderness ?Extremities: warm and well perfused ?Neuro: Appropriate, no focal neurological deficits ? ?Most Recent Labs: ?Lab Results  ?Component Value Date  ? WBC 19.1 (H) 12/27/2021  ? HGB 11.2 (L) 12/27/2021  ? HCT 32.8 (L) 12/27/2021  ? PLT 261 12/27/2021  ? ? ?Lab Results  ?Component Value Date  ? NA 134 (L) 12/27/2021  ? K 4.8 12/27/2021  ? CL 98 12/27/2021  ? CO2 24 12/27/2021  ? BUN 31 (H) 12/27/2021  ? CREATININE 1.91 (H) 12/27/2021  ? CALCIUM 9.0 12/27/2021  ? ? ?No results found for: INR, APTT ? ? ?Urine Culture: ?4/28 NO GROWTH ?5/2 NO GROWTH ? ?IMAGING: ?CT Renal Stone Study ? ?Result Date: 12/27/2021 ?CLINICAL DATA:  Worsening flank pain EXAM: CT ABDOMEN AND PELVIS WITHOUT CONTRAST TECHNIQUE: Multidetector CT imaging of the abdomen and pelvis was performed following the standard protocol without IV contrast. RADIATION DOSE REDUCTION: This exam was performed according to the departmental dose-optimization program which includes automated exposure control, adjustment of the mA and/or kV according to patient size and/or use of iterative reconstruction technique. COMPARISON:  CT 12/20/2021, 12/18/2021 FINDINGS: Lower chest: Lung bases demonstrate no acute airspace disease or effusion. Hepatobiliary: No focal liver abnormality is seen. No gallstones, gallbladder wall thickening, or biliary dilatation. Pancreas: Unremarkable. No pancreatic ductal dilatation or surrounding inflammatory changes. Spleen: Normal in size without focal abnormality. Adrenals/Urinary Tract: Adrenal glands are normal. Interval worsening right-sided hydronephrosis and hydroureter since the most recent prior, now moderate severity, this is secondary to a 4 mm stone in the distal right ureter just proximal to the UVJ, there is been some interval migration since 12/20/2021. Slight increased  perinephric edema and soft tissue stranding. Additional stone lower pole and midpole right kidney. Stomach/Bowel: Stomach is within normal limits. Appendix appears normal. No evidence of bowel wall thickening, distention, or inflammatory changes. Large volume stool in the colon Vascular/Lymphatic: No significant vascular findings are present. No enlarged abdominal or pelvic lymph nodes. Reproductive: IUD in the uterus.  No adnexal mass Other: Negative for pelvic effusion or free air. Small fat containing umbilical hernia Musculoskeletal: No acute or significant osseous findings. IMPRESSION: 1. Interval worsening of right-sided hydronephrosis and hydroureter, now moderate severity, secondary to a 4-5 mm stone in the distal right ureter just proximal to the right UVJ, some distal migration since 12/20/2021. 2. Additional right kidney stone Electronically Signed   By: Donavan Foil M.D.   On: 12/27/2021 17:14   ? ? ? ?------ ? ?Assessment: ? ?46 y.o. female with right flank pain, nausea, vomiting and CT imaging showing right distal 32mm obstructing stone with moderate upstream  hydronephrosis. ? ?Concerning for infection given: Leukocytosis to 19, Fever to 39.5C ?In the ED received: 2L crystalloid and 2g CTX ? ?We discussed the indications for acute intervention including infected obstruction, bilateral ureteral obstruction or unilateral obstruction of solitary kidney as well as other less urgent indications for decompression which would included intractable pain, N/V, and acute renal injury. We also discussed the possible inability to place a ureteral stent in which case, the next intervention recommended would be a percutaneous nephrostomy tube. Given infected obstruction is present, plan for emergent right ureteral stent placement. Risks, benefits, alternatives, expected peri-op course discussed.  ? ?Patient is aware that tonight's procedure is stent placement and will not definitively treat her stone. Counseled on  the temporary nature of ureteral stent and need for second procedure in the near future for ureteroscopy and laser lithotripsy.  ? ?Recommendations: ?Plan for emergent right ureteral stent placement tonight ?Keep patient NPO ?Consent obtained from patient at bedside.

## 2021-12-27 NOTE — ED Notes (Signed)
Patient transported to CT 

## 2021-12-27 NOTE — ED Notes (Signed)
Pt ambulatory with slow gait to bathroom with stand by assist from family member ?

## 2021-12-27 NOTE — Anesthesia Procedure Notes (Signed)
Procedure Name: Intubation ?Date/Time: 12/27/2021 11:19 PM ?Performed by: Ponciano Ort, CRNA ?Pre-anesthesia Checklist: Patient identified, Emergency Drugs available, Suction available and Patient being monitored ?Patient Re-evaluated:Patient Re-evaluated prior to induction ?Oxygen Delivery Method: Circle system utilized ?Preoxygenation: Pre-oxygenation with 100% oxygen ?Induction Type: IV induction ?Ventilation: Mask ventilation without difficulty ?LMA: LMA inserted ?LMA Size: 4.0 ?Number of attempts: 1 ?Placement Confirmation: positive ETCO2 and breath sounds checked- equal and bilateral ?Tube secured with: Tape ?Dental Injury: Teeth and Oropharynx as per pre-operative assessment  ? ? ? ? ?

## 2021-12-27 NOTE — H&P (Addendum)
?History and Physical  ? ?[name] ERX:[id number] DOB: 01-20-1976 DOA: 12/27/2021 ? ?PCP: Daylene Katayama, PA  ? ?Patient coming from: Home ? ?Chief Complaint: Flank pain ? ?HPI: Susan Mcpherson is a 46 y.o. female with medical history significant of Migraine, protein S deficiency, seizure presenting with recurrent flank pain. ? ?History obtained with assistance of chart review as patient having some confusion when seen.  Patient seen on April 28 for flank pain UA at that time consistent with infection and imaging noted 4 mm stone with hydronephrosis.  She was discharged with pain medication and Keflex.  Presented again on May 2 with similar complaint discharge with change of antibiotic to cefuroxime.  She is following up again today due to persistent symptoms that are in fact worsening since being seen on the second.  She is reporting nausea and vomiting with fever as high as 102 yesterday.  Also hematuria starting yesterday. ? ?She denies chills, chest pain, shortness of breath, constipation, diarrhea. ? ?ED Course: Vital signs in the ED notable for heart rate in the 80s to 100s.  Lab work-up showing CMP with sodium 134, BUN 31, creatinine elevated to 1.91 from baseline of 0.8, glucose 142.  CBC with leukocytosis to 19.1 and hemoglobin stable at 11.2.  Lactic acid normal.  Urinalysis with hemoglobin, protein, leukocytes, bacteria.  Urine culture and blood cultures pending.  CT renal stone study showing increasing right hydronephrosis and hydroureter secondary to a 4 to 5 mm stone at the right UVJ.  Patient received ceftriaxone, fentanyl, Toradol, Zofran, 1.5 L of IV fluids in the ED.  Neurology consulted and recommended admission with plan for stent placement tomorrow.  Also recommending continue antibiotics and n.p.o. ? ?Review of Systems: As per HPI otherwise all other systems reviewed and are negative. ? ?Past Medical History:  ?Diagnosis Date  ? Ectopic pregnancy   ? Migraine   ? Seizures (HCC)   ? not on  any medications per MD, last seizure was sometime in 2017  ? ? ?Past Surgical History:  ?Procedure Laterality Date  ? Gyn surgery    ? HERNIA REPAIR    ? KIDNEY STONE SURGERY Right   ? Lumps in rt breast    ? RHINOPLASTY    ? SHOULDER ARTHROSCOPY WITH ROTATOR CUFF REPAIR Right 04/22/2018  ? Procedure: Right shoulder arthroscopy with subacromial decompression, distal clavicle resection, rotator cuff repair;  Surgeon: Francena Hanly, MD;  Location: [facility] OR;  Service: Orthopedics;  Laterality: Right;   ? ? ?Social History ? reports that she has never smoked. She has never used smokeless tobacco. She reports that she does not drink alcohol and does not use drugs. ? ?Allergies  ?Allergen Reactions  ? Sumatriptan Anaphylaxis and Swelling  ?  Throat swelling/burning- "I can take the pill.  Not the injection"  ? Cinnamon Swelling and Other (See Comments)  ?  Tongue to swell & mouth sores  ? Codeine Rash  ? Latex Rash  ? ? ?Family History  ?Problem Relation Age of Onset  ? Hypertension Father   ?Reviewed on admission ? ?Prior to Admission medications   ?Medication Sig Start Date End Date Taking? Authorizing Provider  ?acetaminophen (TYLENOL) 325 MG tablet Take 2 tablets (650 mg total) by mouth every 6 (six) hours as needed. 12/20/21   Sloan Leiter, DO  ?cefUROXime (CEFTIN) 500 MG tablet Take 1 tablet (500 mg total) by mouth 2 (two) times daily with a meal for 7 days. 12/20/21 12/27/21  Wallace Cullens,  Samuel A, DO  ?COPPER PO 1 Device by Intrauterine route once.  12/26/16   [provider]  ?EMGALITY 120 MG/ML SOAJ Inject 120 mg into the skin every 28 (twenty-eight) days. 03/28/18   [provider]  ?ibuprofen (ADVIL) 600 MG tablet Take 1 tablet (600 mg total) by mouth every 6 (six) hours as needed. 12/20/21   Sloan LeiterGray, Samuel A, DO  ?lamoTRIgine (LAMICTAL) 25 MG tablet Take 25 mg by mouth QID.    [provider]  ?levETIRAcetam (KEPPRA) 500 MG tablet Take 500 mg by mouth 2 (two) times daily.    [provider]  ?meloxicam (MOBIC) 15 MG tablet Take 1 tablet (15 mg total) by mouth daily. ?Patient taking differently: Take 15 mg by mouth as needed. 05/28/21   Richardean SaleJackson, Benjamin, DO  ?ondansetron (ZOFRAN) 4 MG tablet Take 1 tablet (4 mg total) by mouth every 8 (eight) hours as needed for nausea or vomiting. 12/01/21   Tegeler, Canary Brimhristopher J, MD  ?ondansetron (ZOFRAN) 4 MG tablet Take 1 tablet (4 mg total) by mouth every 8 (eight) hours as needed for nausea or vomiting. 12/24/21   Darrick GrinderMcCauley, Larry B, PA-C  ?oxyCODONE-acetaminophen (PERCOCET) 10-325 MG tablet Take 1 tablet by mouth every 6 (six) hours as needed for up to 3 days for pain. 12/24/21 12/27/21  Darrick GrinderMcCauley, Larry B, PA-C  ?tamsulosin (FLOMAX) 0.4 MG CAPS capsule Take 1 capsule (0.4 mg total) by mouth daily for 14 days. 12/20/21 01/03/22  Sloan LeiterGray, Samuel A, DO  ? ? ?Physical Exam: ?Vitals:  ? 12/27/21 2000 12/27/21 2015 12/27/21 2030 12/27/21 2125  ?BP: 91/62 100/63 97/65 124/67  ?Pulse: 96 91 95 88  ?Resp: 17  18 20   ?Temp:      ?TempSrc:      ?SpO2: 96% 96% 97% 99%  ?Weight:      ?Height:      ? ? ?Physical Exam ?Constitutional:   ?   General: She is not in acute distress. ?   Appearance: Normal appearance. She is ill-appearing and diaphoretic.  ?HENT:  ?   Head: Normocephalic and atraumatic.  ?   Mouth/Throat:  ?   Mouth: Mucous membranes are moist.  ?   Pharynx: Oropharynx is clear.  ?Eyes:  ?   Extraocular Movements: Extraocular movements intact.  ?   Pupils: Pupils are equal, round, and reactive to light.  ?Cardiovascular:  ?   Rate and Susan: Regular Susan. Tachycardia present.  ?   Pulses: Normal pulses.  ?   Heart sounds: Normal heart sounds.  ?Pulmonary:  ?   Effort: Pulmonary effort is normal. No respiratory distress.  ?   Breath sounds: Normal breath sounds.  ?Abdominal:  ?   General: Bowel sounds are normal. There is no distension.  ?   Palpations: Abdomen is soft.  ?   Tenderness: There is abdominal tenderness.  ?Musculoskeletal:     ?   General: No  swelling or deformity.  ?Skin: ?   General: Skin is warm.  ?Neurological:  ?   General: No focal deficit present.  ?   Mental Status: She is disoriented.  ?   Comments: Oriented to person only.  Knows where she lives.  Does not know the year.  ? ?Labs on Admission: I have personally reviewed following labs and imaging studies ? ?CBC: ?Recent Labs  ?Lab 12/24/21 ?1059 12/27/21 ?1455  ?WBC 6.7 19.1*  ?NEUTROABS 4.9  --   ?HGB 11.9* 11.2*  ?HCT 35.0* 32.8*  ?MCV 90.2 90.9  ?  PLT 257 261  ? ? ?Basic Metabolic Panel: ?Recent Labs  ?Lab 12/24/21 ?1059 12/27/21 ?1455  ?NA 135 134*  ?K 4.6 4.8  ?CL 103 98  ?CO2 23 24  ?GLUCOSE 130* 142*  ?BUN 30* 31*  ?CREATININE 1.28* 1.91*  ?CALCIUM 9.1 9.0  ? ? ?GFR: ?Estimated Creatinine Clearance: 31.4 mL/min (A) (by C-G formula based on SCr of 1.91 mg/dL (H)). ? ?Liver Function Tests: ?Recent Labs  ?Lab 12/24/21 ?1059  ?AST 15  ?ALT 28  ?ALKPHOS 92  ?BILITOT 0.5  ?PROT 7.5  ?ALBUMIN 3.7  ? ? ?Urine analysis: ?   ?Component Value Date/Time  ? COLORURINE YELLOW 12/27/2021 1500  ? APPEARANCEUR CLEAR 12/27/2021 1500  ? LABSPEC 1.015 12/27/2021 1500  ? PHURINE 7.0 12/27/2021 1500  ? GLUCOSEU NEGATIVE 12/27/2021 1500  ? HGBUR MODERATE (A) 12/27/2021 1500  ? BILIRUBINUR NEGATIVE 12/27/2021 1500  ? BILIRUBINUR negative 11/18/2013 1031  ? KETONESUR NEGATIVE 12/27/2021 1500  ? PROTEINUR 30 (A) 12/27/2021 1500  ? UROBILINOGEN 0.2 07/05/2015 2326  ? NITRITE NEGATIVE 12/27/2021 1500  ? LEUKOCYTESUR TRACE (A) 12/27/2021 1500  ? ? ?Radiological Exams on Admission: ?CT Renal Stone Study ? ?Result Date: 12/27/2021 ?CLINICAL DATA:  Worsening flank pain EXAM: CT ABDOMEN AND PELVIS WITHOUT CONTRAST TECHNIQUE: Multidetector CT imaging of the abdomen and pelvis was performed following the standard protocol without IV contrast. RADIATION DOSE REDUCTION: This exam was performed according to the departmental dose-optimization program which includes automated exposure control, adjustment of the mA and/or kV  according to patient size and/or use of iterative reconstruction technique. COMPARISON:  CT 12/20/2021, 12/18/2021 FINDINGS: Lower chest: Lung bases demonstrate no acute airspace disease or effusion. Hepatobiliary: No fo

## 2021-12-27 NOTE — Transfer of Care (Signed)
Immediate Anesthesia Transfer of Care Note ? ?Patient: Susan Mcpherson ? ?Procedure(s) Performed: CYSTOSCOPY WITH RETROGRADE PYELOGRAM/URETERAL STENT PLACEMENT (Right) ? ?Patient Location: PACU ? ?Anesthesia Type:General ? ?Level of Consciousness: easily arousal, answers appropriately ? ?Airway & Oxygen Therapy: Patient Spontanous Breathing, O2 via face mask ? ?Post-op Assessment: Report given to RN and Post -op Vital signs reviewed and stable at baseline. ? ?Post vital signs: Reviewed and stable ? ?Last Vitals:  ?Vitals Value Taken Time  ?BP 83/53 12/27/21 2348  ?Temp    ?Pulse 103 12/27/21 2349  ?Resp 20 12/27/21 2349  ?SpO2 99 % 12/27/21 2349  ?Vitals shown include unvalidated device data. ? ?Last Pain:  ?Vitals:  ? 12/27/21 2133  ?TempSrc: Oral  ?PainSc: 10-Worst pain ever  ?   ? ?Patients Stated Pain Goal: 2 (12/27/21 2133) ? ?Complications: No notable events documented. ?

## 2021-12-27 NOTE — ED Notes (Signed)
PA at bedside.

## 2021-12-27 NOTE — ED Triage Notes (Signed)
Pt arrives pov, to triage in wheelchair, with continued pain on left flank r/t recent dx of kidney stone. Pt tearful in triage. Took oxycodone at 1300 and ibuprofen with no relief ?

## 2021-12-27 NOTE — ED Notes (Signed)
Pt has returned from CT and is requesting more pain meds. PA informed.  ?

## 2021-12-27 NOTE — ED Provider Notes (Signed)
?[facility] EMERGENCY DEPARTMENT ?Provider Note ? ? ?CSN: [id number] ?Arrival date & time: 12/27/21  1425 ? ?  ? ?History ? ?Chief Complaint  ?Patient presents with  ? Flank Pain  ? ? ?Susan Mcpherson is a 46 y.o. female. ? ?HPI ?Patient is a 46 year old female with a history of protein S deficiency, migraines, who presents to the emergency department due to right flank and abdominal pain.  Initially seen on April 28 for fevers, chills, as well as persistent pain.  UA appeared infectious at that visit.  She was also found to have a 4 mm stone along the distal ureter with right-sided hydroureter nephrosis.  She was discharged with pain medications as well as Keflex.  She was then seen again on May 2 with similar complaints.  She was discharged once again in stable condition and her antibiotic was switched to cefuroxime.  She states that her symptoms have persisted and worsened.  Reports nausea, vomiting, abdominal pain, as well as flank pain.  Also notes fevers with a Tmax of 102 ?F yesterday measured orally.  Lastly, patient notes that she also began experiencing hematuria yesterday.  She has an appointment with urology on May 10. ? ?Per record review, initial urine culture on April 28 showed no growth.  Repeat urine culture on May 2 showed less than 10,000 CFU's. ?  ? ?Home Medications ?Prior to Admission medications   ?Medication Sig Start Date End Date Taking? Authorizing Provider  ?acetaminophen (TYLENOL) 325 MG tablet Take 2 tablets (650 mg total) by mouth every 6 (six) hours as needed. 12/20/21   Sloan LeiterGray, Samuel A, DO  ?cefUROXime (CEFTIN) 500 MG tablet Take 1 tablet (500 mg total) by mouth 2 (two) times daily with a meal for 7 days. 12/20/21 12/27/21  Tanda RockersGray, Samuel A, DO  ?COPPER PO 1 Device by Intrauterine route once.  12/26/16   [provider]  ?EMGALITY 120 MG/ML SOAJ Inject 120 mg into the skin every 28 (twenty-eight) days. 03/28/18   [provider]  ?ibuprofen (ADVIL) 600 MG tablet Take 1  tablet (600 mg total) by mouth every 6 (six) hours as needed. 12/20/21   Sloan LeiterGray, Samuel A, DO  ?lamoTRIgine (LAMICTAL) 25 MG tablet Take 25 mg by mouth QID.    [provider]  ?levETIRAcetam (KEPPRA) 500 MG tablet Take 500 mg by mouth 2 (two) times daily.    [provider]  ?meloxicam (MOBIC) 15 MG tablet Take 1 tablet (15 mg total) by mouth daily. ?Patient taking differently: Take 15 mg by mouth as needed. 05/28/21   Richardean SaleJackson, Benjamin, DO  ?ondansetron (ZOFRAN) 4 MG tablet Take 1 tablet (4 mg total) by mouth every 8 (eight) hours as needed for nausea or vomiting. 12/01/21   Tegeler, Canary Brimhristopher J, MD  ?ondansetron (ZOFRAN) 4 MG tablet Take 1 tablet (4 mg total) by mouth every 8 (eight) hours as needed for nausea or vomiting. 12/24/21   Darrick GrinderMcCauley, Larry B, PA-C  ?oxyCODONE-acetaminophen (PERCOCET) 10-325 MG tablet Take 1 tablet by mouth every 6 (six) hours as needed for up to 3 days for pain. 12/24/21 12/27/21  Darrick GrinderMcCauley, Larry B, PA-C  ?tamsulosin (FLOMAX) 0.4 MG CAPS capsule Take 1 capsule (0.4 mg total) by mouth daily for 14 days. 12/20/21 01/03/22  Sloan LeiterGray, Samuel A, DO  ?   ? ?Allergies    ?Sumatriptan, Cinnamon, Codeine, and Latex   ? ?Review of Systems   ?Review of Systems  ?All other systems reviewed and are negative. ?Ten systems reviewed  and are negative for acute change, except as noted in the HPI.   ?Physical Exam ?Updated Vital Signs ?BP 117/64   Pulse 98   Temp 99.3 ?F (37.4 ?C) (Oral)   Resp 17   Ht 5' (1.524 m)   Wt 65.3 kg   LMP 12/01/2021 Comment: neg upreg in er today.  SpO2 100%   BMI 28.12 kg/m?  ?Physical Exam ?Vitals and nursing note reviewed.  ?Constitutional:   ?   General: She is in acute distress.  ?   Appearance: Normal appearance. She is not ill-appearing, toxic-appearing or diaphoretic.  ?HENT:  ?   Head: Normocephalic and atraumatic.  ?   Right Ear: External ear normal.  ?   Left Ear: External ear normal.  ?   Nose: Nose normal.  ?   Mouth/Throat:  ?   Mouth: Mucous membranes  are moist.  ?   Pharynx: Oropharynx is clear. No oropharyngeal exudate or posterior oropharyngeal erythema.  ?Eyes:  ?   General: No scleral icterus.    ?   Right eye: No discharge.     ?   Left eye: No discharge.  ?   Extraocular Movements: Extraocular movements intact.  ?   Conjunctiva/sclera: Conjunctivae normal.  ?Cardiovascular:  ?   Rate and Rhythm: Normal rate and regular rhythm.  ?   Pulses: Normal pulses.  ?   Heart sounds: Normal heart sounds. No murmur heard. ?  No friction rub. No gallop.  ?Pulmonary:  ?   Effort: Pulmonary effort is normal. No respiratory distress.  ?   Breath sounds: Normal breath sounds. No stridor. No wheezing, rhonchi or rales.  ?Abdominal:  ?   General: Abdomen is flat.  ?   Palpations: Abdomen is soft.  ?   Tenderness: There is abdominal tenderness. There is right CVA tenderness. There is no left CVA tenderness.  ?   Comments: Protuberant abdomen that is soft.  Moderate tenderness noted along the right central and lateral abdomen.  Additional moderate right CVA tenderness noted.  ?Musculoskeletal:     ?   General: Normal range of motion.  ?   Cervical back: Normal range of motion and neck supple. No tenderness.  ?Skin: ?   General: Skin is warm and dry.  ?Neurological:  ?   General: No focal deficit present.  ?   Mental Status: She is alert and oriented to person, place, and time.  ?Psychiatric:     ?   Mood and Affect: Mood normal.     ?   Behavior: Behavior normal.  ? ?ED Results / Procedures / Treatments   ?Labs ?(all labs ordered are listed, but only abnormal results are displayed) ?Labs Reviewed  ?BASIC METABOLIC PANEL - Abnormal; Notable for the following components:  ?    Result Value  ? Sodium 134 (*)   ? Glucose, Bld 142 (*)   ? BUN 31 (*)   ? Creatinine, Ser 1.91 (*)   ? GFR, Estimated 33 (*)   ? All other components within normal limits  ?CBC - Abnormal; Notable for the following components:  ? WBC 19.1 (*)   ? RBC 3.61 (*)   ? Hemoglobin 11.2 (*)   ? HCT 32.8 (*)   ?  All other components within normal limits  ?URINALYSIS, ROUTINE W REFLEX MICROSCOPIC - Abnormal; Notable for the following components:  ? Hgb urine dipstick MODERATE (*)   ? Protein, ur 30 (*)   ? Leukocytes,Ua TRACE (*)   ?  All other components within normal limits  ?URINALYSIS, MICROSCOPIC (REFLEX) - Abnormal; Notable for the following components:  ? Bacteria, UA MANY (*)   ? All other components within normal limits  ?URINE CULTURE  ?CULTURE, BLOOD (ROUTINE X 2)  ?CULTURE, BLOOD (ROUTINE X 2)  ?LACTIC ACID, PLASMA  ?HCG, SERUM, QUALITATIVE  ? ?EKG ?None ? ?Radiology ?CT Renal Stone Study ? ?Result Date: 12/27/2021 ?CLINICAL DATA:  Worsening flank pain EXAM: CT ABDOMEN AND PELVIS WITHOUT CONTRAST TECHNIQUE: Multidetector CT imaging of the abdomen and pelvis was performed following the standard protocol without IV contrast. RADIATION DOSE REDUCTION: This exam was performed according to the departmental dose-optimization program which includes automated exposure control, adjustment of the mA and/or kV according to patient size and/or use of iterative reconstruction technique. COMPARISON:  CT 12/20/2021, 12/18/2021 FINDINGS: Lower chest: Lung bases demonstrate no acute airspace disease or effusion. Hepatobiliary: No focal liver abnormality is seen. No gallstones, gallbladder wall thickening, or biliary dilatation. Pancreas: Unremarkable. No pancreatic ductal dilatation or surrounding inflammatory changes. Spleen: Normal in size without focal abnormality. Adrenals/Urinary Tract: Adrenal glands are normal. Interval worsening right-sided hydronephrosis and hydroureter since the most recent prior, now moderate severity, this is secondary to a 4 mm stone in the distal right ureter just proximal to the UVJ, there is been some interval migration since 12/20/2021. Slight increased perinephric edema and soft tissue stranding. Additional stone lower pole and midpole right kidney. Stomach/Bowel: Stomach is within normal limits.  Appendix appears normal. No evidence of bowel wall thickening, distention, or inflammatory changes. Large volume stool in the colon Vascular/Lymphatic: No significant vascular findings are present. No enlarged

## 2021-12-28 DIAGNOSIS — N132 Hydronephrosis with renal and ureteral calculous obstruction: Secondary | ICD-10-CM | POA: Diagnosis not present

## 2021-12-28 LAB — BASIC METABOLIC PANEL
Anion gap: 9 (ref 5–15)
BUN: 30 mg/dL — ABNORMAL HIGH (ref 6–20)
CO2: 20 mmol/L — ABNORMAL LOW (ref 22–32)
Calcium: 7.2 mg/dL — ABNORMAL LOW (ref 8.9–10.3)
Chloride: 108 mmol/L (ref 98–111)
Creatinine, Ser: 1.67 mg/dL — ABNORMAL HIGH (ref 0.44–1.00)
GFR, Estimated: 38 mL/min — ABNORMAL LOW (ref >60)
Glucose, Bld: 127 mg/dL — ABNORMAL HIGH (ref 70–99)
Potassium: 4.1 mmol/L (ref 3.5–5.1)
Sodium: 137 mmol/L (ref 135–145)

## 2021-12-28 LAB — BLOOD CULTURE ID PANEL (REFLEXED) - BCID2

## 2021-12-28 LAB — LACTIC ACID, PLASMA
Lactic Acid, Venous: 1.4 mmol/L (ref 0.5–1.9)
Lactic Acid, Venous: 1.5 mmol/L (ref 0.5–1.9)

## 2021-12-28 LAB — CBC
HCT: 26.9 % — ABNORMAL LOW (ref 36.0–46.0)
Hemoglobin: 9 g/dL — ABNORMAL LOW (ref 12.0–15.0)
MCH: 31.6 pg (ref 26.0–34.0)
MCHC: 33.5 g/dL (ref 30.0–36.0)
MCV: 94.4 fL (ref 80.0–100.0)
Platelets: 147 10*3/uL — ABNORMAL LOW (ref 150–400)
RBC: 2.85 MIL/uL — ABNORMAL LOW (ref 3.87–5.11)
RDW: 14.2 % (ref 11.5–15.5)
WBC: 20.2 10*3/uL — ABNORMAL HIGH (ref 4.0–10.5)
nRBC: 0 % (ref 0.0–0.2)

## 2021-12-28 LAB — HIV ANTIBODY (ROUTINE TESTING W REFLEX): HIV Screen 4th Generation wRfx: NONREACTIVE

## 2021-12-28 MED ORDER — SODIUM CHLORIDE 0.9 % IV SOLN: INTRAVENOUS | Status: DC

## 2021-12-28 MED ORDER — SODIUM CHLORIDE 0.9 % IV SOLN
1.0000 g | Freq: Two times a day (BID) | INTRAVENOUS | Status: DC
  Administered 2021-12-28 (×2): 1 g via INTRAVENOUS
  Filled 2021-12-28: qty 1
  Filled 2021-12-28: qty 20

## 2021-12-28 MED ORDER — SODIUM CHLORIDE 0.9 % IV BOLUS
500.0000 mL | Freq: Once | INTRAVENOUS | Status: AC
  Administered 2021-12-28: 500 mL via INTRAVENOUS

## 2021-12-28 MED ORDER — LAMOTRIGINE 100 MG PO TABS
100.0000 mg | ORAL_TABLET | Freq: Two times a day (BID) | ORAL | Status: DC
  Administered 2021-12-28 – 2021-12-30 (×4): 100 mg via ORAL
  Filled 2021-12-28 (×4): qty 1

## 2021-12-28 MED ORDER — SODIUM CHLORIDE 0.9 % IV SOLN
2.0000 g | Freq: Two times a day (BID) | INTRAVENOUS | Status: DC
  Administered 2021-12-28 – 2021-12-30 (×4): 2 g via INTRAVENOUS
  Filled 2021-12-28 (×4): qty 12.5

## 2021-12-28 MED ORDER — LEVETIRACETAM 500 MG PO TABS
750.0000 mg | ORAL_TABLET | Freq: Two times a day (BID) | ORAL | Status: DC
  Administered 2021-12-28 – 2021-12-30 (×4): 750 mg via ORAL
  Filled 2021-12-28 (×4): qty 1

## 2021-12-28 MED ORDER — PHENOL 1.4 % MT LIQD
1.0000 | OROMUCOSAL | Status: DC | PRN
  Administered 2021-12-28: 1 via OROMUCOSAL
  Filled 2021-12-28: qty 177

## 2021-12-28 NOTE — Progress Notes (Signed)
?PROGRESS NOTE ? ? ?Susan Mcpherson  XBD:[id number] DOB: Sep 15, 1975 DOA: 12/27/2021 ?PCP: Daylene Katayama, PA  ?Brief Narrative:  ?46 y/o wf, works at [employer], no prior nephrolithiasis ?Protein s def, prior Sz d/o, Migraines ?Admit with failure of OP management of UTI x 2 courses ABx- ?found to increasing R hydronephrosis on CT +5 mm UVJ stone, AKI to Cr 1.9 ? ? ?Hospital-Problem based course ? ?Sepsis 2/2 R Nephrolithiasis ?S/p definitive cysto + R Ureteric 6 Fr x 24cm placement 5/5 ?Defer rest to urology--NS 150 cc/h, pain control as per them ?Follow cult,--is on Meropenem ?Hydro 2/2 stone, AKI ?Fluids as above--labs am ?Pro S def--unclear if this has  ^ incidence of Kidney stones? ---OP follow up + PCP/Heme ? ? ?DVT prophylaxis: lovenox ?Code Status: full ?Family Communication: none ?Disposition:  ?Status is: Inpatient ?Remains inpatient appropriate because:  ? ?Still sick ?  ?Consultants:  ?uro ? ?Procedures: as abv ? ?Antimicrobials: as abv  ? ? ?Subjective: ? ?Fair ?Not much pain ?Wants to eat "food"  ?No fever no chills ? ?Objective: ?Vitals:  ? 12/28/21 0444 12/28/21 0654 12/28/21 0819 12/28/21 1235  ?BP: (!) 88/58 111/83 115/88 131/90  ?Pulse: 70 67 (!) 53 (!) 56  ?Resp: 18  14 16   ?Temp: 97.7 ?F (36.5 ?C)  98.5 ?F (36.9 ?C) 98.7 ?F (37.1 ?C)  ?TempSrc:   Oral Oral  ?SpO2: 99%  97% 99%  ?Weight:      ?Height:      ? ? ?Intake/Output Summary (Last 24 hours) at 12/28/2021 1346 ?Last data filed at 12/28/2021 1311 ?Gross per 24 hour  ?Intake 4488.27 ml  ?Output 2400 ml  ?Net 2088.27 ml  ? ?Filed Weights  ? 12/27/21 1440  ?Weight: 65.3 kg  ? ? ?Examination: ? ?Eomi ncat, nose ring, no ict pallor, slight sleepy ?Cta b no added sound ?Abd soft  ?Neuro intact ?Abd soft--mild CV tender ?No Le edema ?Psych euthy ? ?Data Reviewed: personally reviewed  ? ?CBC ?   ?Component Value Date/Time  ? WBC 20.2 (H) 12/28/2021 0541  ? RBC 2.85 (L) 12/28/2021 0541  ? HGB 9.0 (L) 12/28/2021 0541  ? HCT 26.9  (L) 12/28/2021 0541  ? PLT 147 (L) 12/28/2021 0541  ? MCV 94.4 12/28/2021 0541  ? MCH 31.6 12/28/2021 0541  ? MCHC 33.5 12/28/2021 0541  ? RDW 14.2 12/28/2021 0541  ? LYMPHSABS 1.2 12/24/2021 1059  ? MONOABS 0.4 12/24/2021 1059  ? EOSABS 0.1 12/24/2021 1059  ? BASOSABS 0.0 12/24/2021 1059  ? ? ?  Latest Ref Rng & Units 12/28/2021  ?  5:41 AM 12/27/2021  ?  2:55 PM 12/24/2021  ? 10:59 AM  ?CMP  ?Glucose 70 - 99 mg/dL 02/23/2022   834   196    ?BUN 6 - 20 mg/dL 30   31   30     ?Creatinine 0.44 - 1.00 mg/dL 222     9.79    ?Sodium 135 - 145 mmol/L 137   134   135    ?Potassium 3.5 - 5.1 mmol/L 4.1   4.8   4.6    ?Chloride 98 - 111 mmol/L 108   98   103    ?CO2 22 - 32 mmol/L 20   24   23     ?Calcium 8.9 - 10.3 mg/dL 7.2   9.0   9.1    ?Total Protein 6.5 - 8.1 g/dL   7.5    ?Total Bilirubin 0.3 - 1.2 mg/dL  0.5    ?Alkaline Phos 38 - 126 U/L   92    ?AST 15 - 41 U/L   15    ?ALT 0 - 44 U/L   28    ? ? ? ?Radiology Studies: ?DG C-Arm 1-60 Min-No Report ? ?Result Date: 12/27/2021 ?Fluoroscopy was utilized by the requesting physician.  No radiographic interpretation.  ? ?CT Renal Stone Study ? ?Result Date: 12/27/2021 ?CLINICAL DATA:  Worsening flank pain EXAM: CT ABDOMEN AND PELVIS WITHOUT CONTRAST TECHNIQUE: Multidetector CT imaging of the abdomen and pelvis was performed following the standard protocol without IV contrast. RADIATION DOSE REDUCTION: This exam was performed according to the departmental dose-optimization program which includes automated exposure control, adjustment of the mA and/or kV according to patient size and/or use of iterative reconstruction technique. COMPARISON:  CT 12/20/2021, 12/18/2021 FINDINGS: Lower chest: Lung bases demonstrate no acute airspace disease or effusion. Hepatobiliary: No focal liver abnormality is seen. No gallstones, gallbladder wall thickening, or biliary dilatation. Pancreas: Unremarkable. No pancreatic ductal dilatation or surrounding inflammatory changes. Spleen: Normal in size  without focal abnormality. Adrenals/Urinary Tract: Adrenal glands are normal. Interval worsening right-sided hydronephrosis and hydroureter since the most recent prior, now moderate severity, this is secondary to a 4 mm stone in the distal right ureter just proximal to the UVJ, there is been some interval migration since 12/20/2021. Slight increased perinephric edema and soft tissue stranding. Additional stone lower pole and midpole right kidney. Stomach/Bowel: Stomach is within normal limits. Appendix appears normal. No evidence of bowel wall thickening, distention, or inflammatory changes. Large volume stool in the colon Vascular/Lymphatic: No significant vascular findings are present. No enlarged abdominal or pelvic lymph nodes. Reproductive: IUD in the uterus.  No adnexal mass Other: Negative for pelvic effusion or free air. Small fat containing umbilical hernia Musculoskeletal: No acute or significant osseous findings. IMPRESSION: 1. Interval worsening of right-sided hydronephrosis and hydroureter, now moderate severity, secondary to a 4-5 mm stone in the distal right ureter just proximal to the right UVJ, some distal migration since 12/20/2021. 2. Additional right kidney stone Electronically Signed   By: Jasmine Pang M.D.   On: 12/27/2021 17:14   ? ? ?Scheduled Meds: ? mupirocin ointment  1 application. Nasal BID  ? sodium chloride flush  3 mL Intravenous Q12H  ? ?Continuous Infusions: ? sodium chloride Stopped (12/28/21 0122)  ? sodium chloride 150 mL/hr at 12/28/21 7902  ? meropenem (MERREM) IV 1 g (12/28/21 1308)  ? ? ? LOS: 1 day  ? ?Time spent: 24 ? ?Rhetta Mura, MD ?[facility] ?To contact the attending provider between 7A-7P or the covering provider during after hours 7P-7A, please log into the web site [email or web address] and access using universal [place] password for that web site. If you do not have the password, please call the hospital operator. ? ?12/28/2021, 1:46 PM  ? ? ?

## 2021-12-28 NOTE — Progress Notes (Signed)
Pharmacy Antibiotic Note ? ?Susan Mcpherson is a 46 y.o. female admitted on 12/27/2021 with ureteral stone.  Pharmacy has been consulted to dose merrem for UTI (failed outpt cephalosporin tx) ? ?Plan: ?Merrem 1gm IV q12h ?Follow renal function and clinical course ? ?Height: 5' (152.4 cm) ?Weight: 65.3 kg (144 lb) ?IBW/kg (Calculated) : 45.5 ? ?Temp (24hrs), Avg:100 ?F (37.8 ?C), Min:98.8 ?F (37.1 ?C), Max:103.1 ?F (39.5 ?C) ? ?Recent Labs  ?Lab 12/24/21 ?1059 12/27/21 ?1455 12/27/21 ?1547  ?WBC 6.7 19.1*  --   ?CREATININE 1.28* 1.91*  --   ?LATICACIDVEN  --   --  1.6  ?  ?Estimated Creatinine Clearance: 31.4 mL/min (A) (by C-G formula based on SCr of 1.91 mg/dL (H)).   ? ?Allergies  ?Allergen Reactions  ? Sumatriptan Anaphylaxis and Swelling  ?  Throat swelling/burning- "I can take the pill.  Not the injection"  ? Cinnamon Swelling and Other (See Comments)  ?  Tongue to swell & mouth sores  ? Codeine Rash  ? Latex Rash  ? ? ? ?Thank you for allowing pharmacy to be a part of this patient?s care. ? ?Arley Phenix RPh ?12/28/2021, 12:11 AM ? ?

## 2021-12-28 NOTE — Progress Notes (Signed)
1 Day Post Op s/p right ureteral stent placement for infected obstructing ureteral stone  ? ?Subjective: ?Feeling much improved today. Rigors and pain resolved.  ?Afebrile since stent placement  ?Blood pressure and tachycardia resolved  ?Creatinine downtrending ? ?Objective: ?Vital signs in last 24 hours: ?Temp:  [97.7 ?F (36.5 ?C)-103.1 ?F (39.5 ?C)] 98.5 ?F (36.9 ?C) (05/06 3546) ?Pulse Rate:  [53-106] 56 (05/06 1235) ?Resp:  [14-22] 16 (05/06 1235) ?BP: (80-131)/(50-90) 131/90 (05/06 1235) ?SpO2:  [96 %-100 %] 99 % (05/06 1235) ?Weight:  [65.3 kg] 65.3 kg (05/05 1440) ? ?Intake/Output from previous day: ?05/05 0701 - 05/06 0700 ?In: 4488.3 [I.V.:1067.8; IV Piggyback:3420.4] ?Out: 1400 [Urine:1400] ?Intake/Output this shift: ?No intake/output data recorded. ? ?Physical Exam:  ?General: Alert and oriented ?CV: RRR ?Lungs: Clear ?Abdomen: Soft, ND. Pain improved from prior  ?GU: Foley catheter in place. Urine draining clear yellow.  ?Ext: NT, No erythema ? ?Lab Results: ?Recent Labs  ?  12/27/21 ?1455 12/28/21 ?0541  ?HGB 11.2* 9.0*  ?HCT 32.8* 26.9*  ? ?BMET ?Recent Labs  ?  12/27/21 ?1455 12/28/21 ?0541  ?NA 134* 137  ?K 4.8 4.1  ?CL 98 108  ?CO2 24 20*  ?GLUCOSE 142* 127*  ?BUN 31* 30*  ?CREATININE 1.91* 1.67*  ?CALCIUM 9.0 7.2*  ? ? ? ?Studies/Results: ?DG C-Arm 1-60 Min-No Report ? ?Result Date: 12/27/2021 ?Fluoroscopy was utilized by the requesting physician.  No radiographic interpretation.  ? ?CT Renal Stone Study ? ?Result Date: 12/27/2021 ?CLINICAL DATA:  Worsening flank pain EXAM: CT ABDOMEN AND PELVIS WITHOUT CONTRAST TECHNIQUE: Multidetector CT imaging of the abdomen and pelvis was performed following the standard protocol without IV contrast. RADIATION DOSE REDUCTION: This exam was performed according to the departmental dose-optimization program which includes automated exposure control, adjustment of the mA and/or kV according to patient size and/or use of iterative reconstruction technique. COMPARISON:   CT 12/20/2021, 12/18/2021 FINDINGS: Lower chest: Lung bases demonstrate no acute airspace disease or effusion. Hepatobiliary: No focal liver abnormality is seen. No gallstones, gallbladder wall thickening, or biliary dilatation. Pancreas: Unremarkable. No pancreatic ductal dilatation or surrounding inflammatory changes. Spleen: Normal in size without focal abnormality. Adrenals/Urinary Tract: Adrenal glands are normal. Interval worsening right-sided hydronephrosis and hydroureter since the most recent prior, now moderate severity, this is secondary to a 4 mm stone in the distal right ureter just proximal to the UVJ, there is been some interval migration since 12/20/2021. Slight increased perinephric edema and soft tissue stranding. Additional stone lower pole and midpole right kidney. Stomach/Bowel: Stomach is within normal limits. Appendix appears normal. No evidence of bowel wall thickening, distention, or inflammatory changes. Large volume stool in the colon Vascular/Lymphatic: No significant vascular findings are present. No enlarged abdominal or pelvic lymph nodes. Reproductive: IUD in the uterus.  No adnexal mass Other: Negative for pelvic effusion or free air. Small fat containing umbilical hernia Musculoskeletal: No acute or significant osseous findings. IMPRESSION: 1. Interval worsening of right-sided hydronephrosis and hydroureter, now moderate severity, secondary to a 4-5 mm stone in the distal right ureter just proximal to the right UVJ, some distal migration since 12/20/2021. 2. Additional right kidney stone Electronically Signed   By: Jasmine Pang M.D.   On: 12/27/2021 17:14   ? ?Assessment/Plan: ?46 y/o female with 47mm distal right ureteral stone with infection s/p emergent stent placement 5/5 ? ?Clinically improving since stent placement. Cultures pending  ? ?-Remove foley catheter and perform TOV today  ?-Agree with broad spectrum antibiotics while cultures pend ?-Suspect  creatinine will continue  to improve to baseline after stent placement  ?-For pain recommend scheduled tylenol, prn tramadol. For stent discomfort can start flomax 0.4mg  daily and oxybutynin 5mg  TID for spasms  ?-She will need to return in 2-3 weeks for definitive stone management.  ? ? ? ? LOS: 1 day  ? ? Ellyssa Zagal ?12/28/2021, 12:40 PM ? ?  ?

## 2021-12-28 NOTE — Progress Notes (Signed)
PHARMACY - PHYSICIAN COMMUNICATION ?CRITICAL VALUE ALERT - BLOOD CULTURE IDENTIFICATION (BCID) ? ?Susan Mcpherson is an 46 y.o. female with hx kidney stone who presented to [facility] on 12/27/2021 with a chief complaint of right-sided flank pain.  CT renal showed worsening of right-sided hydronephrosis and hydroureter and a stone in the distal right ureter just proximal to the right UVJ.  She underwent right ureteral stent placement on 12/27/21. Two of 4 blood culture bottles collected on 12/27/21 came back with GNR (BCID= klebsiella aerogenes). ? ?Name of physician (or Provider) Contacted: Dr. Mahala Menghini ? ?Current antibiotics: meropenem ? ?Changes to prescribed antibiotics recommended:  ?- change abx to cefepime 2gm q12h for crcl 30-60 ? ?Results for orders placed or performed during the hospital encounter of 12/27/21  ?Blood Culture ID Panel (Reflexed) (Collected: 12/27/2021  3:47 PM)  ?Result Value Ref Range  ? Enterococcus faecalis NOT DETECTED NOT DETECTED  ? Enterococcus Faecium NOT DETECTED NOT DETECTED  ? Listeria monocytogenes NOT DETECTED NOT DETECTED  ? Staphylococcus species NOT DETECTED NOT DETECTED  ? Staphylococcus aureus (BCID) NOT DETECTED NOT DETECTED  ? Staphylococcus epidermidis NOT DETECTED NOT DETECTED  ? Staphylococcus lugdunensis NOT DETECTED NOT DETECTED  ? Streptococcus species NOT DETECTED NOT DETECTED  ? Streptococcus agalactiae NOT DETECTED NOT DETECTED  ? Streptococcus pneumoniae NOT DETECTED NOT DETECTED  ? Streptococcus pyogenes NOT DETECTED NOT DETECTED  ? A.calcoaceticus-baumannii NOT DETECTED NOT DETECTED  ? Bacteroides fragilis NOT DETECTED NOT DETECTED  ? Enterobacterales DETECTED (A) NOT DETECTED  ? Enterobacter cloacae complex NOT DETECTED NOT DETECTED  ? Escherichia coli NOT DETECTED NOT DETECTED  ? Klebsiella aerogenes DETECTED (A) NOT DETECTED  ? Klebsiella oxytoca NOT DETECTED NOT DETECTED  ? Klebsiella pneumoniae NOT DETECTED NOT DETECTED  ? Proteus species NOT DETECTED NOT  DETECTED  ? Salmonella species NOT DETECTED NOT DETECTED  ? Serratia marcescens NOT DETECTED NOT DETECTED  ? Haemophilus influenzae NOT DETECTED NOT DETECTED  ? Neisseria meningitidis NOT DETECTED NOT DETECTED  ? Pseudomonas aeruginosa NOT DETECTED NOT DETECTED  ? Stenotrophomonas maltophilia NOT DETECTED NOT DETECTED  ? Candida albicans NOT DETECTED NOT DETECTED  ? Candida auris NOT DETECTED NOT DETECTED  ? Candida glabrata NOT DETECTED NOT DETECTED  ? Candida krusei NOT DETECTED NOT DETECTED  ? Candida parapsilosis NOT DETECTED NOT DETECTED  ? Candida tropicalis NOT DETECTED NOT DETECTED  ? Cryptococcus neoformans/gattii NOT DETECTED NOT DETECTED  ? CTX-M ESBL NOT DETECTED NOT DETECTED  ? Carbapenem resistance IMP NOT DETECTED NOT DETECTED  ? Carbapenem resistance KPC NOT DETECTED NOT DETECTED  ? Carbapenem resistance NDM NOT DETECTED NOT DETECTED  ? Carbapenem resist OXA 48 LIKE NOT DETECTED NOT DETECTED  ? Carbapenem resistance VIM NOT DETECTED NOT DETECTED  ? ? ?Dorna Leitz P ?12/28/2021  2:50 PM ? ?

## 2021-12-28 NOTE — Progress Notes (Addendum)
BP 86/59 map 68 after 1 liter bolus given. Notified Dr. Hal Hope. New order for additional 500 cc bolus ordered and started. Will continue to monitor.  ? ?After bolus BP 88/58 map 68. Notified Dr. Hal Hope. New order for stat lactic acid and increase fluids to 138ml/hr. Will continue to monitor.  ?

## 2021-12-29 ENCOUNTER — Inpatient Hospital Stay (HOSPITAL_COMMUNITY): Payer: Medicaid Other

## 2021-12-29 DIAGNOSIS — N132 Hydronephrosis with renal and ureteral calculous obstruction: Secondary | ICD-10-CM | POA: Diagnosis not present

## 2021-12-29 LAB — COMPREHENSIVE METABOLIC PANEL
ALT: 46 U/L — ABNORMAL HIGH (ref 0–44)
AST: 29 U/L (ref 15–41)
Albumin: 3 g/dL — ABNORMAL LOW (ref 3.5–5.0)
Alkaline Phosphatase: 125 U/L (ref 38–126)
Anion gap: 5 (ref 5–15)
BUN: 26 mg/dL — ABNORMAL HIGH (ref 6–20)
CO2: 21 mmol/L — ABNORMAL LOW (ref 22–32)
Calcium: 7.7 mg/dL — ABNORMAL LOW (ref 8.9–10.3)
Chloride: 110 mmol/L (ref 98–111)
Creatinine, Ser: 1.07 mg/dL — ABNORMAL HIGH (ref 0.44–1.00)
GFR, Estimated: >60 mL/min (ref >60)
Glucose, Bld: 117 mg/dL — ABNORMAL HIGH (ref 70–99)
Potassium: 4 mmol/L (ref 3.5–5.1)
Sodium: 136 mmol/L (ref 135–145)
Total Bilirubin: 0.4 mg/dL (ref 0.3–1.2)
Total Protein: 6 g/dL — ABNORMAL LOW (ref 6.5–8.1)

## 2021-12-29 LAB — URINE CULTURE: Culture: <10000 — AB

## 2021-12-29 LAB — CBC
HCT: 28.6 % — ABNORMAL LOW (ref 36.0–46.0)
Hemoglobin: 9.3 g/dL — ABNORMAL LOW (ref 12.0–15.0)
MCH: 30.8 pg (ref 26.0–34.0)
MCHC: 32.5 g/dL (ref 30.0–36.0)
MCV: 94.7 fL (ref 80.0–100.0)
Platelets: 178 10*3/uL (ref 150–400)
RBC: 3.02 MIL/uL — ABNORMAL LOW (ref 3.87–5.11)
RDW: 14.4 % (ref 11.5–15.5)
WBC: 21.9 10*3/uL — ABNORMAL HIGH (ref 4.0–10.5)
nRBC: 0 % (ref 0.0–0.2)

## 2021-12-29 MED ORDER — OXYBUTYNIN CHLORIDE 5 MG PO TABS
5.0000 mg | ORAL_TABLET | Freq: Three times a day (TID) | ORAL | Status: DC
  Administered 2021-12-29 – 2021-12-30 (×4): 5 mg via ORAL
  Filled 2021-12-29 (×4): qty 1

## 2021-12-29 MED ORDER — FUROSEMIDE 10 MG/ML IJ SOLN: 40.0000 mg | INTRAMUSCULAR | Status: AC

## 2021-12-29 MED ORDER — FUROSEMIDE 10 MG/ML IJ SOLN
INTRAMUSCULAR | Status: AC
  Administered 2021-12-29: 40 mg
  Filled 2021-12-29: qty 2

## 2021-12-29 MED ORDER — GUAIFENESIN-DM 100-10 MG/5ML PO SYRP
5.0000 mL | ORAL_SOLUTION | ORAL | Status: DC | PRN
  Administered 2021-12-29: 5 mL via ORAL
  Filled 2021-12-29: qty 5

## 2021-12-29 MED ORDER — TAMSULOSIN HCL 0.4 MG PO CAPS
0.4000 mg | ORAL_CAPSULE | Freq: Every day | ORAL | Status: DC
  Administered 2021-12-29 – 2021-12-30 (×2): 0.4 mg via ORAL
  Filled 2021-12-29 (×2): qty 1

## 2021-12-29 NOTE — Progress Notes (Signed)
MD has arrived to bedside. ?Orders received and noted. ?Pt alert and answering questions appropriately. ? ?

## 2021-12-29 NOTE — Anesthesia Postprocedure Evaluation (Signed)
Anesthesia Post Note ? ?Patient: Susan Mcpherson ? ?Procedure(s) Performed: CYSTOSCOPY WITH RETROGRADE PYELOGRAM/URETERAL STENT PLACEMENT (Right: Ureter) ? ?  ? ?Patient location during evaluation: PACU ?Anesthesia Type: General ?Level of consciousness: awake and alert ?Pain management: pain level controlled ?Vital Signs Assessment: post-procedure vital signs reviewed and stable ?Respiratory status: spontaneous breathing, nonlabored ventilation, respiratory function stable and patient connected to nasal cannula oxygen ?Cardiovascular status: blood pressure returned to baseline and stable ?Postop Assessment: no apparent nausea or vomiting ?Anesthetic complications: no ? ? ?No notable events documented. ? ?Last Vitals:  ?Vitals:  ? 12/29/21 0525 12/29/21 0651  ?BP: (!) 150/104 134/90  ?Pulse: 90 (!) 102  ?Resp: 18 16  ?Temp: 36.9 ?C 37 ?C  ?SpO2: 96% 96%  ?  ?Last Pain:  ?Vitals:  ? 12/29/21 0651  ?TempSrc: Oral  ?PainSc:   ? ? ?  ?  ?  ?  ?  ?  ? ?Beryle Lathe ? ? ? ? ?

## 2021-12-29 NOTE — Progress Notes (Signed)
Have arrived to pt's room this morning to find pt on 3 L/min O2 via [place], SOB, with cough and increased WOB. Have immediately stopped IV fluids of NS at 150 cc/hr. Paged MD. ?

## 2021-12-29 NOTE — Progress Notes (Signed)
Have given pt ordered Lasix 40mg  IV. ?Pt with expiratory wheezing and ronchi. ?Sitting patient up in bed and working on deep breathing and reassurance. ? ?

## 2021-12-29 NOTE — Progress Notes (Signed)
?PROGRESS NOTE ? ? ?Susan ScheuermannLeah B Mcpherson  YNW:[id number] DOB: 28-Jun-1976 DOA: 12/27/2021 ?PCP: Susan KatayamaGordnier, Hilary, PA  ?Brief Narrative:  ?46 y/o wf, works at [employer]--active lady--no Caffeine/Soda, no prior nephrolithiasis ?Protein s def, prior Sz d/o, Migraines ?Admit with failure of OP management of UTI x 2 courses ABx- ?found to increasing R hydronephrosis on CT +5 mm UVJ stone, AKI to Cr 1.9 ? ? ?Hospital-Problem based course ? ?Iatrogenic fluid overload ?X-ray read as potentially viral pneumonia-my over read of the x-ray shows bilateral blunting of both lung fields  ? ?clinically she is improved with 1 dose of Lasix 40 IVafter receiving resuscitation for sepsis ?Does not require further work-up-reassess later and discontinue oxygen that was started earlier this morning ?Sepsis 2/2 R Nephrolithiasis ?S/p definitive cysto + R Ureteric 6 Fr x 24cm placement 5/5 ?Adding today oxybutynin 5 3 times daily, Flomax 0.4 ?BC 5/5 sensitive organism - antibiotics adjusted  to cefepime by pharmacy and likely [facility] the [facility] report ?White count still elevated but lactic acid is stable ?Hydro 2/2 stone, AKI ?Pro S def--unclear if this has  ^ incidence of Kidney stones? ---OP follow up + PCP/Heme ?Mild metabolic secondary to AKI (AKI present on admission from sepsis/stone and obstructive uropathy) ?Monitor trends on lab expect will recover with improvement of creatinine ?Impaired glucose tolerance-CBGs running below 150 which is less than 6.2 A1c-monitor only at this time ? ?DVT prophylaxis: lovenox ?Code Status: full ?Family Communication: none ?Disposition:  ?Status is: Inpatient ?Remains inpatient appropriate because:  ? ?Still sick ?  ?Consultants:  ?uro ? ?Procedures: as abv ? ?Antimicrobials: as abv  ? ? ?Subjective: ? ?Called to bedside this AM because of significant SOB ?This resolved pretty rapidly with a dose of Lasix ?Bladder scan shows no retention ?Chest x-ray report  as above ? ?Objective: ?Vitals:  ? 12/28/21 1235 12/28/21 2240 12/29/21 0525 12/29/21 [id number]  ?BP: 131/90 (!) 144/99 (!) 150/104 134/90  ?Pulse: (!) 56 78 90 (!) 102  ?Resp: 16 18 18 16   ?Temp: 98.7 ?F (37.1 ?C) 97.6 ?F (36.4 ?C) 98.5 ?F (36.9 ?C) 98.6 ?F (37 ?C)  ?TempSrc: Oral Oral Oral Oral  ?SpO2: 99% 94% 96% 96%  ?Weight:      ?Height:      ? ? ?Intake/Output Summary (Last 24 hours) at 12/29/2021 0720 ?Last data filed at 12/29/2021 [id number] ?Gross per 24 hour  ?Intake 3299.01 ml  ?Output 2210 ml  ?Net 1089.01 ml  ? ? ?Filed Weights  ? 12/27/21 1440  ?Weight: 65.3 kg  ? ? ?Examination: ? ?Eomi ncat, nose ring, no ict pallor, awake conversant ?Cta b no added sound ?Abd soft-mild CVA tenderness ?Neuro intact ?Abd soft--mild CV tender ?No Le edema ?Psych euthy slightly anxious ? ?Data Reviewed: personally reviewed  ? ?CBC ?   ?Component Value Date/Time  ? WBC 21.9 (H) 12/29/2021 0535  ? RBC 3.02 (L) 12/29/2021 0535  ? HGB 9.3 (L) 12/29/2021 0535  ? HCT 28.6 (L) 12/29/2021 0535  ? PLT 178 12/29/2021 0535  ? MCV 94.7 12/29/2021 0535  ? MCH 30.8 12/29/2021 0535  ? MCHC 32.5 12/29/2021 0535  ? RDW 14.4 12/29/2021 0535  ? LYMPHSABS 1.2 12/24/2021 1059  ? MONOABS 0.4 12/24/2021 1059  ? EOSABS 0.1 12/24/2021 1059  ? BASOSABS 0.0 12/24/2021 1059  ? ? ?  Latest Ref Rng & Units 12/29/2021  ?  5:35 AM 12/28/2021  ?  5:41 AM 12/27/2021  ?  2:55 PM  ?CMP  ?Glucose 70 - 99 mg/dL 250   037   048    ?BUN 6 - 20 mg/dL 26   30   31     ?Creatinine 0.44 - 1.00 mg/dL   8.89   1.69    ?Sodium 135 - 145 mmol/L 136   137   134    ?Potassium 3.5 - 5.1 mmol/L 4.0   4.1   4.8    ?Chloride 98 - 111 mmol/L 110   108   98    ?CO2 22 - 32 mmol/L 21   20   24     ?Calcium 8.9 - 10.3 mg/dL 7.7   7.2   9.0    ?Total Protein 6.5 - 8.1 g/dL 6.0      ?Total Bilirubin 0.3 - 1.2 mg/dL 0.4      ?Alkaline Phos 38 - 126 U/L 125      ?AST 15 - 41 U/L 29      ?ALT 0 - 44 U/L 46      ? ? ? ?Radiology Studies: ?DG C-Arm 1-60 Min-No Report ? ?Result Date:  12/27/2021 ?Fluoroscopy was utilized by the requesting physician.  No radiographic interpretation.  ? ?CT Renal Stone Study ? ?Result Date: 12/27/2021 ?CLINICAL DATA:  Worsening flank pain EXAM: CT ABDOMEN AND PELVIS WITHOUT CONTRAST TECHNIQUE: Multidetector CT imaging of the abdomen and pelvis was performed following the standard protocol without IV contrast. RADIATION DOSE REDUCTION: This exam was performed according to the departmental dose-optimization program which includes automated exposure control, adjustment of the mA and/or kV according to patient size and/or use of iterative reconstruction technique. COMPARISON:  CT 12/20/2021, 12/18/2021 FINDINGS: Lower chest: Lung bases demonstrate no acute airspace disease or effusion. Hepatobiliary: No focal liver abnormality is seen. No gallstones, gallbladder wall thickening, or biliary dilatation. Pancreas: Unremarkable. No pancreatic ductal dilatation or surrounding inflammatory changes. Spleen: Normal in size without focal abnormality. Adrenals/Urinary Tract: Adrenal glands are normal. Interval worsening right-sided hydronephrosis and hydroureter since the most recent prior, now moderate severity, this is secondary to a 4 mm stone in the distal right ureter just proximal to the UVJ, there is been some interval migration since 12/20/2021. Slight increased perinephric edema and soft tissue stranding. Additional stone lower pole and midpole right kidney. Stomach/Bowel: Stomach is within normal limits. Appendix appears normal. No evidence of bowel wall thickening, distention, or inflammatory changes. Large volume stool in the colon Vascular/Lymphatic: No significant vascular findings are present. No enlarged abdominal or pelvic lymph nodes. Reproductive: IUD in the uterus.  No adnexal mass Other: Negative for pelvic effusion or free air. Small fat containing umbilical hernia Musculoskeletal: No acute or significant osseous findings. IMPRESSION: 1. Interval worsening of  right-sided hydronephrosis and hydroureter, now moderate severity, secondary to a 4-5 mm stone in the distal right ureter just proximal to the right UVJ, some distal migration since 12/20/2021. 2. Additional right kidney stone Electronically Signed   By: 12/22/2021 M.D.   On: 12/27/2021 17:14   ? ? ?Scheduled Meds: ? lamoTRIgine  100 mg Oral BID  ? levETIRAcetam  750 mg Oral BID  ? mupirocin ointment  1 application. Nasal BID  ? oxybutynin  5 mg Oral TID  ? sodium chloride flush  3 mL Intravenous Q12H  ? tamsulosin  0.4 mg Oral QPC breakfast  ? ?Continuous Infusions: ? sodium chloride 150 mL/hr at 12/29/21 0429  ? ceFEPime (MAXIPIME) IV Stopped (12/28/21 2329)  ? ? ? LOS: 2  days  ? ?Time spent: 41 ? ?CRITICAL CARE ?Performed by: Rhetta Mura ? ? ?Total critical care time: 20 minutes ? ?Critical care time was exclusive of separately billable procedures and treating other patients. ? ?Critical care was necessary to treat or prevent imminent or life-threatening deterioration. ? ?Critical care was time spent personally by me on the following activities: development of treatment plan with patient and/or surrogate as well as nursing, discussions with consultants, evaluation of patient's response to treatment, examination of patient, obtaining history from patient or surrogate, ordering and performing treatments and interventions, ordering and review of laboratory studies, ordering and review of radiographic studies, pulse oximetry and re-evaluation of patient's condition. ? ? ?Rhetta Mura, MD ?[facility] ?To contact the attending provider between 7A-7P or the covering provider during after hours 7P-7A, please log into the web site [email or web address] and access using universal [place] password for that web site. If you do not have the password, please call the hospital operator. ? ?12/29/2021, 7:20 AM  ? ? ?

## 2021-12-29 NOTE — Progress Notes (Signed)
Have removed O2 with sats holding at 93%. Pt has sat on edge of bed, moved to [facility] and voiding has started. ?Chest xray to follow.  ?Medications given as ordered.  ?

## 2021-12-29 NOTE — Discharge Instructions (Signed)
DISCHARGE INSTRUCTIONS FOR KIDNEY STONE/URETERAL STENT  ? ?MEDICATIONS:  ?1.  Resume all your other meds from home - except do not take any extra narcotic pain meds that you may have at home.  ?2. Pyridium is to help with the burning/stinging when you urinate. ?3. Tramadol is for moderate/severe pain, otherwise taking upto 1000 mg every 6 hours of plainTylenol will help treat your pain.   ?4. Take Cipro one hour prior to removal of your stent.  ? ?ACTIVITY:  ?1. No strenuous activity x 1week  ?2. No driving while on narcotic pain medications  ?3. Drink plenty of water  ?4. Continue to walk at home - you can still get blood clots when you are at home, so keep active, but don't over do it.  ?5. May return to work/school tomorrow or when you feel ready  ? ?BATHING:  ?1. You can shower and we recommend daily showers  ?2. You have a string coming from your urethra: The stent string is attached to your ureteral stent. Do not pull on this.  ? ?SIGNS/SYMPTOMS TO CALL:  ?Please call us if you have a fever greater than 101.5, uncontrolled nausea/vomiting, uncontrolled pain, dizziness, unable to urinate, bloody urine, chest pain, shortness of breath, leg swelling, leg pain, redness around wound, drainage from wound, or any other concerns or questions.  ? ?You can reach [place] at [phone].  ? ?FOLLOW-UP:  ?1. You will be contacted with surgery day in the coming week. ?

## 2021-12-29 NOTE — Plan of Care (Signed)
  Problem: Education: Goal: Required Educational Video(s) Outcome: Progressing   Problem: Clinical Measurements: Goal: Postoperative complications will be avoided or minimized Outcome: Progressing   Problem: Skin Integrity: Goal: Demonstration of wound healing without infection will improve Outcome: Progressing   

## 2021-12-29 NOTE — Progress Notes (Signed)
2 Day Post Op s/p right ureteral stent placement for infected obstructing ureteral stone  ? ?Interval: ?Remains afebrile with normal vitals  ?Developed SOB this morning, requiring 2L [place]. Chest xray showing possible volume overload ?Cr improved to 1.07 ?WBC remains elevated to 21K ?Blood cultures growing gram negative rods  ? ?Objective: ?Vital signs in last 24 hours: ?Temp:  [97.6 ?F (36.4 ?C)-98.7 ?F (37.1 ?C)] 98.6 ?F (37 ?C) (05/07 [id number]) ?Pulse Rate:  [56-102] 102 (05/07 0651) ?Resp:  [16-18] 16 (05/07 [id number]) ?BP: (131-150)/(90-104) 134/90 (05/07 [id number]) ?SpO2:  [94 %-99 %] 96 % (05/07 0651) ? ?Intake/Output from previous day: ?05/06 0701 - 05/07 0700 ?In: 3299 [P.O.:360; I.V.:2839; IV Piggyback:100] ?Out: 2210 [Urine:2210] ?Intake/Output this shift: ?Total I/O ?In: -  ?Out: 1100 [Urine:1100] ? ?Physical Exam:  ?General: Alert and oriented ?CV: RRR ?Lungs: Clear ?Abdomen: Soft, ND. Pain improved from prior  ?GU: Voiding spontaneously  ?Ext: NT, No erythema ? ?Lab Results: ?Recent Labs  ?  12/27/21 ?1455 12/28/21 ?[id number] 12/29/21 ?[id number]  ?HGB 11.2* 9.0* 9.3*  ?HCT 32.8* 26.9* 28.6*  ? ?BMET ?Recent Labs  ?  12/28/21 ?[id number] 12/29/21 ?[id number]  ?NA 137 136  ?K 4.1 4.0  ?CL 108 110  ?CO2 20* 21*  ?GLUCOSE 127* 117*  ?BUN 30* 26*  ?CREATININE 1.67* 1.07*  ?CALCIUM 7.2* 7.7*  ? ? ? ?Studies/Results: ?DG CHEST PORT 1 VIEW ? ?Result Date: 12/29/2021 ?CLINICAL DATA:  46 year old female with history of new onset of shortness of breath for the past 3 hours. EXAM: PORTABLE CHEST 1 VIEW COMPARISON:  Chest x-ray 07/27/2021. FINDINGS: Ill-defined opacities and areas of subtle interstitial prominence are noted in the lower lungs bilaterally. Mild blunting of the costophrenic sulci bilaterally suggesting trace bilateral pleural effusions. No pneumothorax. No evidence of pulmonary edema. Heart size is borderline enlarged. Upper mediastinal contours are within normal limits allowing for slight patient rotation to the right. IMPRESSION: 1.  Subtle ill-defined opacities and areas of interstitial prominence in the lung bases, nonspecific, but concerning for potential viral infection. 2. Trace bilateral pleural effusions. Electronically Signed   By: Vinnie Langton M.D.   On: 12/29/2021 08:18  ? ?DG C-Arm 1-60 Min-No Report ? ?Result Date: 12/27/2021 ?Fluoroscopy was utilized by the requesting physician.  No radiographic interpretation.  ? ?CT Renal Stone Study ? ?Result Date: 12/27/2021 ?CLINICAL DATA:  Worsening flank pain EXAM: CT ABDOMEN AND PELVIS WITHOUT CONTRAST TECHNIQUE: Multidetector CT imaging of the abdomen and pelvis was performed following the standard protocol without IV contrast. RADIATION DOSE REDUCTION: This exam was performed according to the departmental dose-optimization program which includes automated exposure control, adjustment of the mA and/or kV according to patient size and/or use of iterative reconstruction technique. COMPARISON:  CT 12/20/2021, 12/18/2021 FINDINGS: Lower chest: Lung bases demonstrate no acute airspace disease or effusion. Hepatobiliary: No focal liver abnormality is seen. No gallstones, gallbladder wall thickening, or biliary dilatation. Pancreas: Unremarkable. No pancreatic ductal dilatation or surrounding inflammatory changes. Spleen: Normal in size without focal abnormality. Adrenals/Urinary Tract: Adrenal glands are normal. Interval worsening right-sided hydronephrosis and hydroureter since the most recent prior, now moderate severity, this is secondary to a 4 mm stone in the distal right ureter just proximal to the UVJ, there is been some interval migration since 12/20/2021. Slight increased perinephric edema and soft tissue stranding. Additional stone lower pole and midpole right kidney. Stomach/Bowel: Stomach is within normal limits. Appendix appears normal. No evidence of bowel wall thickening, distention, or inflammatory changes. Large volume stool in the colon  Vascular/Lymphatic: No significant  vascular findings are present. No enlarged abdominal or pelvic lymph nodes. Reproductive: IUD in the uterus.  No adnexal mass Other: Negative for pelvic effusion or free air. Small fat containing umbilical hernia Musculoskeletal: No acute or significant osseous findings. IMPRESSION: 1. Interval worsening of right-sided hydronephrosis and hydroureter, now moderate severity, secondary to a 4-5 mm stone in the distal right ureter just proximal to the right UVJ, some distal migration since 12/20/2021. 2. Additional right kidney stone Electronically Signed   By: Donavan Foil M.D.   On: 12/27/2021 17:14   ? ?Assessment/Plan: ?46 y/o female with 67mm distal right ureteral stone with infection s/p emergent stent placement 5/5 ? ?Clinically improving since stent placement.  ? ?-Agree with broad spectrum antibiotics while cultures pend ?-Suspect creatinine will continue to improve to baseline, 1.07 today  ?-For pain recommend scheduled tylenol, prn tramadol. For stent discomfort can start flomax 0.4mg  daily and oxybutynin 5mg  TID for spasms  ?-She will need to return in 2-3 weeks for definitive stone management.  ? ? ? ? LOS: 2 days  ? ?Valetta Fuller Keoki Mchargue ?12/29/2021, 10:24 AM ? ?  ?

## 2021-12-29 NOTE — Progress Notes (Signed)
Pt sleeping. ?Has voided about 2000cc since intervention. Close monitoring continues. ?Pt states she feels better. ? ?Chest xray results are in. MD aware. ?Urology has seen patient. ? ?

## 2021-12-30 ENCOUNTER — Encounter (HOSPITAL_COMMUNITY): Payer: Self-pay | Admitting: Urology

## 2021-12-30 DIAGNOSIS — N132 Hydronephrosis with renal and ureteral calculous obstruction: Secondary | ICD-10-CM | POA: Diagnosis not present

## 2021-12-30 DIAGNOSIS — N179 Acute kidney failure, unspecified: Secondary | ICD-10-CM | POA: Diagnosis not present

## 2021-12-30 DIAGNOSIS — D72829 Elevated white blood cell count, unspecified: Secondary | ICD-10-CM | POA: Diagnosis not present

## 2021-12-30 LAB — BASIC METABOLIC PANEL
Anion gap: 8 (ref 5–15)
BUN: 18 mg/dL (ref 6–20)
CO2: 24 mmol/L (ref 22–32)
Calcium: 8.3 mg/dL — ABNORMAL LOW (ref 8.9–10.3)
Chloride: 107 mmol/L (ref 98–111)
Creatinine, Ser: 1 mg/dL (ref 0.44–1.00)
GFR, Estimated: >60 mL/min (ref >60)
Glucose, Bld: 86 mg/dL (ref 70–99)
Potassium: 4.1 mmol/L (ref 3.5–5.1)
Sodium: 139 mmol/L (ref 135–145)

## 2021-12-30 LAB — CBC WITH DIFFERENTIAL/PLATELET
Abs Immature Granulocytes: 0.19 10*3/uL — ABNORMAL HIGH (ref 0.00–0.07)
Basophils Absolute: 0.1 10*3/uL (ref 0.0–0.1)
Basophils Relative: 1 %
Eosinophils Absolute: 0.1 10*3/uL (ref 0.0–0.5)
Eosinophils Relative: 1 %
HCT: 27.1 % — ABNORMAL LOW (ref 36.0–46.0)
Hemoglobin: 9.3 g/dL — ABNORMAL LOW (ref 12.0–15.0)
Immature Granulocytes: 2 %
Lymphocytes Relative: 18 %
Lymphs Abs: 1.6 10*3/uL (ref 0.7–4.0)
MCH: 31.1 pg (ref 26.0–34.0)
MCHC: 34.3 g/dL (ref 30.0–36.0)
MCV: 90.6 fL (ref 80.0–100.0)
Monocytes Absolute: 0.5 10*3/uL (ref 0.1–1.0)
Monocytes Relative: 5 %
Neutro Abs: 6.6 10*3/uL (ref 1.7–7.7)
Neutrophils Relative %: 73 %
Platelets: 190 10*3/uL (ref 150–400)
RBC: 2.99 MIL/uL — ABNORMAL LOW (ref 3.87–5.11)
RDW: 13.9 % (ref 11.5–15.5)
WBC: 9.1 10*3/uL (ref 4.0–10.5)
nRBC: 0 % (ref 0.0–0.2)

## 2021-12-30 LAB — URINE CULTURE: Culture: 40000 — AB

## 2021-12-30 LAB — MAGNESIUM: Magnesium: 1.7 mg/dL (ref 1.7–2.4)

## 2021-12-30 LAB — CULTURE, BLOOD (ROUTINE X 2): Special Requests: ADEQUATE

## 2021-12-30 MED ORDER — LEVOFLOXACIN 750 MG PO TABS: 750.0000 mg | ORAL_TABLET | Freq: Every day | ORAL | 0 refills | Status: AC

## 2021-12-30 MED ORDER — TAMSULOSIN HCL 0.4 MG PO CAPS: 0.4000 mg | ORAL_CAPSULE | Freq: Every day | ORAL | 0 refills | Status: AC

## 2021-12-30 MED ORDER — GUAIFENESIN-DM 100-10 MG/5ML PO SYRP: 10.0000 mL | ORAL_SOLUTION | Freq: Four times a day (QID) | ORAL | 0 refills | Status: AC | PRN

## 2021-12-30 MED ORDER — OXYBUTYNIN CHLORIDE 5 MG PO TABS: 5.0000 mg | ORAL_TABLET | Freq: Three times a day (TID) | ORAL | 0 refills | Status: AC

## 2021-12-30 NOTE — Progress Notes (Signed)
Went over discharge instructions w/ pt. Pt verbalized understanding.  

## 2021-12-30 NOTE — Discharge Summary (Signed)
Physician Discharge Summary  ?Susan ScheuermannLeah B Mcpherson ZOX:[id number] DOB: May 19, 1976 DOA: 12/27/2021 ? ?PCP: Susan KatayamaGordnier, Hilary, PA ? ?Admit date: 12/27/2021 ?Discharge date: 12/30/2021 ? ?Admitted From: Home ?Disposition: Home ? ?Recommendations for Outpatient Follow-up:  ?Follow up with PCP in 1 week with repeat CBC/BMP ?Outpatient follow-up with urology ?Follow up in ED if symptoms worsen or new appear ? ? ?Home Health: No ?Equipment/Devices: None ? ?Discharge Condition: Stable ?CODE STATUS: Full ?Diet recommendation: Regular  ?brief/Interim Summary: ?46 year old female with history of protein S deficiency, seizure disorder, migraines presented with flank pain despite being on oral antibiotics for UTI and possible hydronephrosis.  And presentation, she had leukocytosis with acute kidney injury with CT renal stone study showing increasing right hydronephrosis and hydroureter secondary to 4 to 5 mm stone at the right UVJ.  She was started on IV antibiotics.  Urology was consulted.  During the hospitalization, she underwent cystoscopy and right ureteral stent placement by urology on 12/27/2021.  Subsequently, urine culture grew Enterococcus faecalis and blood culture grew Enterobacter aerogenes.  Currently on cefepime.  Hemodynamically stable and wants to go home today.  Received a dose of IV Lasix on 12/29/2021 for fluid overload, subsequently respiratory status has improved.  Discharge patient home today on oral Levaquin for 7 more days.  Outpatient follow-up with urology. ? ?Discharge Diagnoses:  ? ?Sepsis secondary to right pyelonephritis/hydronephrosis/right ureteral stone ?Enterobacter aeruginosa bacteremia ?Leukocytosis ?-Status post cystoscopy and right ureteral stent placement on 12/27/2021.  Currently on Flomax and oxybutynin as well. ?-Currently on cefepime. ?-Blood culture grew Enterobacter aeruginosa.  Urine culture grew Enterococcus faecalis.  Leukocytosis has resolved.  Sepsis has resolved.  Currently hemodynamically stable.   Feels okay to go home today.  DC home on Levaquin for 7 more days.  Discussed case with Dr. Thermon LeylandWallace/ID via secure chat who recommended Levaquin. ?-Outpatient follow-up with urology.  Continue Flomax and oxybutynin ? ?Fluid overload ?-Patient required a dose of IV Lasix on 12/29/2021.  Subsequently, off of oxygen and respiratory status has much improved. ? ?Protein S deficiency ?-Outpatient follow-up with oncology ? ?AKI ?-Resolved.  Outpatient follow-up ? ?Mild metabolic encephalopathy ?-Improved ? ?Hyponatremia ?-Mild.  Resolved. ? ? ?Discharge Instructions ? ?Discharge Instructions   ? ? Diet general   Complete by: As directed ?  ? Increase activity slowly   Complete by: As directed ?  ? ?  ? ?Allergies as of 12/30/2021   ? ?   Reactions  ? Sumatriptan Anaphylaxis, Swelling  ? Throat swelling/burning- "I can take the pill.  Not the injection"  ? Cinnamon Swelling, Other (See Comments)  ? Tongue to swell & mouth sores  ? Codeine Rash  ? Latex Rash  ? ?  ? ?  ?Medication List  ?  ? ?STOP taking these medications   ? ?cefUROXime 500 MG tablet ?Commonly known as: CEFTIN ?  ?oxyCODONE-acetaminophen 10-325 MG tablet ?Commonly known as: Percocet ?  ? ?  ? ?TAKE these medications   ? ?acetaminophen 325 MG tablet ?Commonly known as: Tylenol ?Take 2 tablets (650 mg total) by mouth every 6 (six) hours as needed. ?  ?COPPER PO ?1 Device by Intrauterine route once. ?  ?guaiFENesin-dextromethorphan 100-10 MG/5ML syrup ?Commonly known as: ROBITUSSIN DM ?Take 10 mLs by mouth every 6 (six) hours as needed for cough (chest congestion). ?  ?ibuprofen 600 MG tablet ?Commonly known as: ADVIL ?Take 1 tablet (600 mg total) by mouth every 6 (six) hours as needed. ?  ?lamoTRIgine 100 MG tablet ?Commonly known as: LAMICTAL ?  Take 100 mg by mouth 2 (two) times daily. ?  ?levETIRAcetam 750 MG tablet ?Commonly known as: KEPPRA ?Take 750 mg by mouth in the morning and at bedtime. ?  ?levofloxacin 750 MG tablet ?Commonly known as: Levaquin ?Take 1  tablet (750 mg total) by mouth daily for 7 days. ?  ?ondansetron 4 MG tablet ?Commonly known as: Zofran ?Take 1 tablet (4 mg total) by mouth every 8 (eight) hours as needed for nausea or vomiting. ?  ?oxybutynin 5 MG tablet ?Commonly known as: DITROPAN ?Take 1 tablet (5 mg total) by mouth 3 (three) times daily. ?  ?tamsulosin 0.4 MG Caps capsule ?Commonly known as: FLOMAX ?Take 1 capsule (0.4 mg total) by mouth daily. ?  ? ?  ? ? Follow-up Information   ? ? Crist Fat, MD Follow up.   ?Specialty: Urology ?Why: Will be contacted with schedule for stone removal ?Contact information: ?[address] ?[place] [address] ?[phone] ? ? ?  ?  ? ? Susan Mcpherson, [place]. Schedule an appointment as soon as possible for a visit in 1 week(s).   ?Specialty: Family Medicine ?Why: With repeat CBC/BMP ?Contact information: ?[address] ?[address] ?[place] [address] ?[phone] ? ? ?  ?  ? ?  ?  ? ?  ? ?Allergies  ?Allergen Reactions  ? Sumatriptan Anaphylaxis and Swelling  ?  Throat swelling/burning- "I can take the pill.  Not the injection"  ? Cinnamon Swelling and Other (See Comments)  ?  Tongue to swell & mouth sores  ? Codeine Rash  ? Latex Rash  ? ? ?Consultations: ?Urology ? ? ?Procedures/Studies: ?CT ABDOMEN PELVIS W CONTRAST ? ?Result Date: 12/20/2021 ?CLINICAL DATA:  Right lower quadrant abdominal pain. Recent CT with possible forniceal rupture. EXAM: CT ABDOMEN AND PELVIS WITH CONTRAST TECHNIQUE: Multidetector CT imaging of the abdomen and pelvis was performed using the standard protocol following bolus administration of intravenous contrast. RADIATION DOSE REDUCTION: This exam was performed according to the departmental dose-optimization program which includes automated exposure control, adjustment of the mA and/or kV according to patient size and/or use of iterative reconstruction technique. CONTRAST:  OMNIPAQUE IOHEXOL 300 MG/ML  SOLN COMPARISON:  CT December 18, 2021 FINDINGS: Lower chest:  Hypoventilatory change in the lung bases. Hepatobiliary: No suspicious hepatic lesion. Gallbladder is unremarkable. No biliary ductal dilation. Pancreas: No pancreatic ductal dilation or evidence of acute inflammation. Spleen: No splenomegaly. Adrenals/Urinary Tract: Bilateral adrenal glands appear normal. Mild right hydroureteronephrosis to the level of a 4 mm distal ureteral stone, decreased from prior. Mild perinephric and periureteric stranding may be reactive or reflect sequela of superimposed infection. Left kidney is unremarkable. Urinary bladder is unremarkable for degree of distension. Stomach/Bowel: No radiopaque enteric contrast material. No evidence of bowel obstruction. Moderate volume of formed stool throughout the colon. Vascular/Lymphatic: Normal caliber abdominal aorta. No pathologically enlarged abdominal or pelvic lymph nodes. Reproductive: Intrauterine device.  No suspicious adnexal mass. Other: Small volume pelvic free fluid. Musculoskeletal: No acute osseous abnormality. IMPRESSION: 1. Decreased now mild right hydroureteronephrosis to the level of a 4 mm distal ureteral stone. Mild perinephric and periureteric stranding may be reactive or reflect sequela of superimposed infection. 2. Moderate volume of formed stool throughout the colon. 3. Small volume pelvic free fluid . Electronically Signed   By: Maudry Mayhew M.D.   On: 12/20/2021 13:10  ? ?DG CHEST PORT 1 VIEW ? ?Result Date: 12/29/2021 ?CLINICAL DATA:  46 year old female with history of new onset of  shortness of breath for the past 3 hours. EXAM: PORTABLE CHEST 1 VIEW COMPARISON:  Chest x-ray 07/27/2021. FINDINGS: Ill-defined opacities and areas of subtle interstitial prominence are noted in the lower lungs bilaterally. Mild blunting of the costophrenic sulci bilaterally suggesting trace bilateral pleural effusions. No pneumothorax. No evidence of pulmonary edema. Heart size is borderline enlarged. Upper mediastinal contours are within  normal limits allowing for slight patient rotation to the right. IMPRESSION: 1. Subtle ill-defined opacities and areas of interstitial prominence in the lung bases, nonspecific, but concerning for potenti

## 2021-12-31 LAB — CULTURE, BLOOD (ROUTINE X 2): Special Requests: ADEQUATE

## 2022-01-15 ENCOUNTER — Other Ambulatory Visit: Payer: Self-pay | Admitting: Urology

## 2022-02-27 ENCOUNTER — Other Ambulatory Visit: Payer: Self-pay

## 2022-02-27 ENCOUNTER — Encounter (HOSPITAL_BASED_OUTPATIENT_CLINIC_OR_DEPARTMENT_OTHER): Payer: Self-pay

## 2022-02-27 ENCOUNTER — Emergency Department (HOSPITAL_BASED_OUTPATIENT_CLINIC_OR_DEPARTMENT_OTHER)
Admission: EM | Admit: 2022-02-27 | Discharge: 2022-02-27 | Disposition: A | Discharge status: Home / Self Care | Payer: Medicaid Other | Attending: Emergency Medicine | Admitting: Emergency Medicine

## 2022-02-27 DIAGNOSIS — Z91040 Latex allergy status: Secondary | ICD-10-CM | POA: Insufficient documentation

## 2022-02-27 DIAGNOSIS — R519 Headache, unspecified: Secondary | ICD-10-CM | POA: Diagnosis present

## 2022-02-27 MED ORDER — DEXAMETHASONE 4 MG PO TABS
4.0000 mg | ORAL_TABLET | Freq: Once | ORAL | Status: AC
  Administered 2022-02-27: 4 mg via ORAL
  Filled 2022-02-27: qty 1

## 2022-02-27 MED ORDER — KETOROLAC TROMETHAMINE 15 MG/ML IJ SOLN
15.0000 mg | Freq: Once | INTRAMUSCULAR | Status: AC
  Administered 2022-02-27: 15 mg via INTRAMUSCULAR
  Filled 2022-02-27: qty 1

## 2022-02-27 MED ORDER — DIPHENHYDRAMINE HCL 50 MG/ML IJ SOLN
25.0000 mg | Freq: Once | INTRAMUSCULAR | Status: AC
  Administered 2022-02-27: 25 mg via INTRAMUSCULAR
  Filled 2022-02-27: qty 1

## 2022-02-27 MED ORDER — METOCLOPRAMIDE HCL 5 MG/ML IJ SOLN
10.0000 mg | Freq: Once | INTRAMUSCULAR | Status: AC
  Administered 2022-02-27: 10 mg via INTRAMUSCULAR
  Filled 2022-02-27: qty 2

## 2022-02-27 NOTE — ED Provider Notes (Signed)
[facility] EMERGENCY DEPARTMENT Provider Note   CSN: [id number] Arrival date & time: 02/27/22  1712     History  Chief Complaint  Patient presents with   Migraine    Susan Mcpherson is a 46 y.o. female.  Patient with history of migraine headaches, history of seizure disorder on Keppra and lamotrigine --presents to the emergency department today for evaluation of headache.  Symptoms started yesterday.  She took her home migraine medication without improvement.  She also tried Excedrin Migraine.  She reports oral at onset.  Pain starts in the back of the head and then generalizes.  No seizure activity today.  She has had nausea without vomiting.  She has had blurry vision but no loss of vision. Patient denies signs of stroke including: facial droop, slurred speech, aphasia, weakness/numbness in extremities, imbalance/trouble walking.  She states that her symptoms are typical for her headaches.  She is requesting migraine cocktail.  States that she will be calling family member for a ride home.       Home Medications Prior to Admission medications   Medication Sig Start Date End Date Taking? Authorizing Provider  acetaminophen (TYLENOL) 325 MG tablet Take 2 tablets (650 mg total) by mouth every 6 (six) hours as needed. 12/20/21   Sloan Leiter, DO  COPPER PO 1 Device by Intrauterine route once.  12/26/16   [provider]  guaiFENesin-dextromethorphan (ROBITUSSIN DM) 100-10 MG/5ML syrup Take 10 mLs by mouth every 6 (six) hours as needed for cough (chest congestion). 12/30/21   Glade Lloyd, MD  ibuprofen (ADVIL) 600 MG tablet Take 1 tablet (600 mg total) by mouth every 6 (six) hours as needed. 12/20/21   Sloan Leiter, DO  lamoTRIgine (LAMICTAL) 100 MG tablet Take 100 mg by mouth 2 (two) times daily. 12/10/21   [provider]  levETIRAcetam (KEPPRA) 750 MG tablet Take 750 mg by mouth in the morning and at bedtime. 02/15/21   [provider]  ondansetron  (ZOFRAN) 4 MG tablet Take 1 tablet (4 mg total) by mouth every 8 (eight) hours as needed for nausea or vomiting. 12/24/21   Darrick Grinder, PA-C  oxybutynin (DITROPAN) 5 MG tablet Take 1 tablet (5 mg total) by mouth 3 (three) times daily. 12/30/21   Glade Lloyd, MD      Allergies    Sumatriptan, Cinnamon, Codeine, and Latex    Review of Systems   Review of Systems  Physical Exam Updated Vital Signs BP (!) 153/92 (BP Location: Right Arm)   Pulse (!) 57   Temp 98.7 F (37.1 C) (Oral)   Resp 16   Ht 5\' 1"  (1.549 m)   Wt 64.4 kg   LMP 02/24/2022 (Exact Date)   SpO2 100%   BMI 26.83 kg/m  Physical Exam Vitals and nursing note reviewed.  Constitutional:      Appearance: She is well-developed.  HENT:     Head: Normocephalic and atraumatic.     Right Ear: External ear normal.     Left Ear: External ear normal.     Nose: Nose normal.     Mouth/Throat:     Mouth: Mucous membranes are moist.     Pharynx: Uvula midline.  Eyes:     General: Lids are normal.     Extraocular Movements:     Right eye: No nystagmus.     Left eye: No nystagmus.     Conjunctiva/sclera: Conjunctivae normal.     Pupils: Pupils are equal,  round, and reactive to light.  Cardiovascular:     Rate and Rhythm: Normal rate and regular rhythm.  Pulmonary:     Effort: Pulmonary effort is normal.     Breath sounds: Normal breath sounds.  Abdominal:     Palpations: Abdomen is soft.     Tenderness: There is no abdominal tenderness. There is no guarding.  Musculoskeletal:     Cervical back: Normal range of motion and neck supple. No tenderness or bony tenderness.  Skin:    General: Skin is warm and dry.  Neurological:     Mental Status: She is alert and oriented to person, place, and time.     GCS: GCS eye subscore is 4. GCS verbal subscore is 5. GCS motor subscore is 6.     Cranial Nerves: No cranial nerve deficit.     Sensory: No sensory deficit.     Motor: No weakness.     Coordination: Coordination  normal.     Comments: Upper extremity myotomes tested bilaterally:  C5 Shoulder abduction 5/5 C6 Elbow flexion/wrist extension 5/5 C7 Elbow extension 5/5 C8 Finger flexion 5/5 T1 Finger abduction 5/5  Lower extremity myotomes tested bilaterally: L2 Hip flexion 5/5 L3 Knee extension 5/5 L4 Ankle dorsiflexion 5/5 S1 Ankle plantar flexion 5/5      ED Results / Procedures / Treatments   Labs (all labs ordered are listed, but only abnormal results are displayed) Labs Reviewed - No data to display  EKG None  Radiology No results found.  Procedures Procedures    Medications Ordered in ED Medications  metoCLOPramide (REGLAN) injection 10 mg (has no administration in time range)  diphenhydrAMINE (BENADRYL) injection 25 mg (has no administration in time range)  ketorolac (TORADOL) 15 MG/ML injection 15 mg (has no administration in time range)  dexamethasone (DECADRON) tablet 4 mg (4 mg Oral Given 02/27/22 2024)    ED Course/ Medical Decision Making/ A&P    Patient seen and examined. History obtained directly from patient.   Labs/EKG: None ordered  Imaging: Considered imaging with CT, however patient without any focal neurodeficits and typical headache.  Medications/Fluids: Patient requesting IM medications versus IV.  She will be given Reglan, Benadryl, Toradol and oral Decadron.  Plan for discharge to home.  Most recent vital signs reviewed and are as follows: BP (!) 153/92 (BP Location: Right Arm)   Pulse (!) 57   Temp 98.7 F (37.1 C) (Oral)   Resp 16   Ht 5\' 1"  (1.549 m)   Wt 64.4 kg   LMP 02/24/2022 (Exact Date)   SpO2 100%   BMI 26.83 kg/m   Initial impression: Migraine headache, reassuring neuro exam, no concern for active seizures.  9:22 PM Reassessment performed. Patient appears comfortable.  She tolerated the medication well.  States that she is tired.  Ready for discharge.  Reviewed pertinent lab work and imaging with patient at bedside. Questions  answered.   Most current vital signs reviewed and are as follows: BP (!) 142/87   Pulse (!) 55   Temp 98.7 F (37.1 C) (Oral)   Resp 17   Ht 5\' 1"  (1.549 m)   Wt 64.4 kg   LMP 02/24/2022 (Exact Date)   SpO2 100%   BMI 26.83 kg/m   Plan: Discharge to home.   Prescriptions written for: None  Other home care instructions discussed: Rest, avoidance of triggers  ED return instructions discussed: Patient counseled to return if they have weakness in their arms or legs, slurred speech,  trouble walking or talking, confusion, trouble with their balance, or if they have any other concerns. Patient verbalizes understanding and agrees with plan.   Follow-up instructions discussed: Patient encouraged to follow-up with their PCP in 3 days.                               Medical Decision Making Risk Prescription drug management.   In regards to the patient's headache, critical differentials were considered including subarachnoid hemorrhage, intracerebral hemorrhage, epidural/subdural hematoma, pituitary apoplexy, vertebral/carotid artery dissection, giant cell arteritis, central venous thrombosis, reversible cerebral vasoconstriction, acute angle closure glaucoma, idiopathic intracranial hypertension, bacterial meningitis, viral encephalitis, carbon monoxide poisoning, posterior reversible encephalopathy syndrome, pre-eclampsia.   Reg flag symptoms related to these causes were considered including systemic symptoms (fever, weight loss), neurologic symptoms (confusion, mental status change, vision change, associated seizure), acute or sudden "thunderclap" onset, patient age 53 or older with new or progressive headache, patient of any age with first headache or change in headache pattern, pregnant or postpartum status, history of HIV or other immunocompromise, history of cancer, headache occurring with exertion, associated neck or shoulder pain, associated traumatic injury, concurrent use of  anticoagulation, family history of spontaneous SAH, and concurrent drug use.    Other benign, more common causes of headache were considered including migraine, tension-type headache, cluster headache, referred pain from other cause such as sinus infection, dental pain, trigeminal neuralgia.   On exam, patient has a reassuring neuro exam including baseline mental status, no significant neck pain or meningeal signs, no signs of severe infection or fever.   The patient's vital signs, pertinent lab work and imaging were reviewed and interpreted as discussed in the ED course. Hospitalization was considered for further testing, treatments, or serial exams/observation. However as patient is well-appearing, has a stable exam over the course of their evaluation, and reassuring studies today, I do not feel that they warrant admission at this time. This plan was discussed with the patient who verbalizes agreement and comfort with this plan and seems reliable and able to return to the Emergency Department with worsening or changing symptoms.          Final Clinical Impression(s) / ED Diagnoses Final diagnoses:  Acute nonintractable headache, unspecified headache type    Rx / DC Orders ED Discharge Orders     None         Desmond Dike 02/27/22 2122    Terrilee Files, MD 02/28/22 [phone]

## 2022-02-27 NOTE — Discharge Instructions (Signed)
Please read and follow all provided instructions.  Your diagnoses today include:  1. Acute nonintractable headache, unspecified headache type     Tests performed today include: Vital signs. See below for your results today.   Medications:  In the Emergency Department you received: Reglan - antinausea/headache medication Benadryl - antihistamine to counteract potential side effects of reglan Toradol - NSAID medication similar to ibuprofen  Take any prescribed medications only as directed.  Additional information:  Follow any educational materials contained in this packet.  You are having a headache. No specific cause was found today for your headache. It may have been a migraine or other cause of headache. Stress, anxiety, fatigue, and depression are common triggers for headaches.   Your headache today does not appear to be life-threatening or require hospitalization, but often the exact cause of headaches is not determined in the emergency department. Therefore, follow-up with your doctor is very important to find out what may have caused your headache and whether or not you need any further diagnostic testing or treatment.   Sometimes headaches can appear benign (not harmful), but then more serious symptoms can develop which should prompt an immediate re-evaluation by your doctor or the emergency department.  BE VERY CAREFUL not to take multiple medicines containing Tylenol (also called acetaminophen). Doing so can lead to an overdose which can damage your liver and cause liver failure and possibly death.   Follow-up instructions: Please follow-up with your primary care provider in the next 3 days for further evaluation of your symptoms.   Return instructions:  Please return to the Emergency Department if you experience worsening symptoms. Return if the medications do not resolve your headache, if it recurs, or if you have multiple episodes of vomiting or cannot keep down  fluids. Return if you have a change from the usual headache. RETURN IMMEDIATELY IF you: Develop a sudden, severe headache Develop confusion or become poorly responsive or faint Develop a fever above 100.50F or problem breathing Have a change in speech, vision, swallowing, or understanding Develop new weakness, numbness, tingling, incoordination in your arms or legs Have a seizure Please return if you have any other emergent concerns.  Additional Information:  Your vital signs today were: BP (!) 142/87   Pulse (!) 55   Temp 98.7 F (37.1 C) (Oral)   Resp 17   Ht 5\' 1"  (1.549 m)   Wt 64.4 kg   LMP 02/24/2022 (Exact Date)   SpO2 100%   BMI 26.83 kg/m  If your blood pressure (BP) was elevated above 135/85 this visit, please have this repeated by your doctor within one month. --------------

## 2022-02-27 NOTE — ED Triage Notes (Signed)
Pt presents with migraine since last night. Pt states that she has history of same. Pt took [place] and excedrin migraine with no relief.

## 2022-02-27 NOTE — ED Notes (Signed)
Pt. Reports headache 8/10 that started last night in the neck area.  Pt. Has history of migraines and reports this being a migraine. Pt. Wants meds in shot form she states to RN.  Pt. In room with lights our and sunglasses on.

## 2022-07-27 ENCOUNTER — Emergency Department (HOSPITAL_BASED_OUTPATIENT_CLINIC_OR_DEPARTMENT_OTHER)
Admission: EM | Admit: 2022-07-27 | Discharge: 2022-07-27 | Disposition: A | Discharge status: Home / Self Care | Payer: Medicaid Other | Attending: Emergency Medicine | Admitting: Emergency Medicine

## 2022-07-27 ENCOUNTER — Emergency Department (HOSPITAL_BASED_OUTPATIENT_CLINIC_OR_DEPARTMENT_OTHER): Payer: Medicaid Other

## 2022-07-27 ENCOUNTER — Other Ambulatory Visit: Payer: Self-pay

## 2022-07-27 ENCOUNTER — Encounter (HOSPITAL_BASED_OUTPATIENT_CLINIC_OR_DEPARTMENT_OTHER): Payer: Self-pay | Admitting: Emergency Medicine

## 2022-07-27 DIAGNOSIS — S67190A Crushing injury of right index finger, initial encounter: Secondary | ICD-10-CM | POA: Diagnosis present

## 2022-07-27 DIAGNOSIS — Z23 Encounter for immunization: Secondary | ICD-10-CM | POA: Diagnosis not present

## 2022-07-27 DIAGNOSIS — W230XXA Caught, crushed, jammed, or pinched between moving objects, initial encounter: Secondary | ICD-10-CM | POA: Insufficient documentation

## 2022-07-27 DIAGNOSIS — S6721XA Crushing injury of right hand, initial encounter: Secondary | ICD-10-CM

## 2022-07-27 DIAGNOSIS — M79641 Pain in right hand: Secondary | ICD-10-CM | POA: Diagnosis not present

## 2022-07-27 MED ORDER — TETANUS-DIPHTH-ACELL PERTUSSIS 5-2.5-18.5 LF-MCG/0.5 IM SUSY
0.5000 mL | PREFILLED_SYRINGE | Freq: Once | INTRAMUSCULAR | Status: AC
  Administered 2022-07-27: 0.5 mL via INTRAMUSCULAR
  Filled 2022-07-27: qty 0.5

## 2022-07-27 NOTE — ED Provider Notes (Signed)
[facility] EMERGENCY DEPARTMENT Provider Note   CSN: [id number] Arrival date & time: 07/27/22  1819     History  Chief Complaint  Patient presents with   Finger Injury    Susan Mcpherson is a 46 y.o. female.  46 yo F with a chief complaints of a crush injury to the right hand.  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  She suffered a break in the skin along the right second digit.  Having diffuse pain along the MCP of the second through fifth digit.        Home Medications Prior to Admission medications   Medication Sig Start Date End Date Taking? Authorizing Provider  acetaminophen (TYLENOL) 325 MG tablet Take 2 tablets (650 mg total) by mouth every 6 (six) hours as needed. 12/20/21   Sloan Leiter, DO  COPPER PO 1 Device by Intrauterine route once.  12/26/16   [provider]  guaiFENesin-dextromethorphan (ROBITUSSIN DM) 100-10 MG/5ML syrup Take 10 mLs by mouth every 6 (six) hours as needed for cough (chest congestion). 12/30/21   Glade Lloyd, MD  ibuprofen (ADVIL) 600 MG tablet Take 1 tablet (600 mg total) by mouth every 6 (six) hours as needed. 12/20/21   Sloan Leiter, DO  lamoTRIgine (LAMICTAL) 100 MG tablet Take 100 mg by mouth 2 (two) times daily. 12/10/21   [provider]  levETIRAcetam (KEPPRA) 750 MG tablet Take 750 mg by mouth in the morning and at bedtime. 02/15/21   [provider]  ondansetron (ZOFRAN) 4 MG tablet Take 1 tablet (4 mg total) by mouth every 8 (eight) hours as needed for nausea or vomiting. 12/24/21   Darrick Grinder, PA-C  oxybutynin (DITROPAN) 5 MG tablet Take 1 tablet (5 mg total) by mouth 3 (three) times daily. 12/30/21   Glade Lloyd, MD      Allergies    Sumatriptan, Cinnamon, Codeine, and Latex    Review of Systems   Review of Systems  Physical  Exam Updated Vital Signs BP 134/84 (BP Location: Left Arm)   Pulse 74   Temp 97.8 F (36.6 C) (Oral)   Resp 16   Ht 5\' 1"  (1.549 m)   Wt 70.4 kg   LMP 07/26/2022   SpO2 100%   BMI 29.32 kg/m  Physical Exam Vitals and nursing note reviewed.  Constitutional:      General: She is not in acute distress.    Appearance: She is well-developed. She is not diaphoretic.  HENT:     Head: Normocephalic and atraumatic.  Eyes:     Pupils: Pupils are equal, round, and reactive to light.  Cardiovascular:     Rate and Rhythm: Normal rate and regular rhythm.     Heart sounds: No murmur heard.    No friction rub. No gallop.  Pulmonary:     Effort: Pulmonary effort is normal.     Breath sounds: No wheezing or rales.  Abdominal:     General: There is no distension.     Palpations: Abdomen is soft.     Tenderness: There is no abdominal tenderness.  Musculoskeletal:        General: Tenderness present.     Cervical back: Normal range of motion and neck supple.     Comments: Diffuse tenderness along the MCP.  Patient with a very superficial break in the skin along the dorsal aspect of the PIP no gaping.  Full range of motion.  Skin:    General: Skin is warm and dry.  Neurological:     Mental Status: She is alert and oriented to person, place, and time.  Psychiatric:        Behavior: Behavior normal.     ED Results / Procedures / Treatments   Labs (all labs ordered are listed, but only abnormal results are displayed) Labs Reviewed - No data to display  EKG None  Radiology DG Hand Complete Right  Result Date: 07/27/2022 CLINICAL DATA:  Second finger injury. EXAM: RIGHT HAND - COMPLETE 3+ VIEW COMPARISON:  None Available. FINDINGS: There is diffuse soft tissue swelling of the second finger. There is no evidence for foreign body. There is no acute fracture or dislocation. Joint spaces are maintained. IMPRESSION: Diffuse soft tissue swelling of the second finger. No acute fracture or  dislocation. Electronically Signed   By: Darliss Cheney M.D.   On: 07/27/2022 18:59    Procedures Procedures    Medications Ordered in ED Medications  Tdap (BOOSTRIX) injection 0.5 mL (has no administration in time range)    ED Course/ Medical Decision Making/ A&P                           Medical Decision Making Amount and/or Complexity of Data Reviewed Radiology: ordered.  Risk Prescription drug management.   46 yo F with a chief complaints of right hand pain.  The patient unfortunately suffered a crush injury to that hand earlier today.  Plain film independently interpreted by me without fracture.  She is complaining mostly along pain to the second digit of the right hand.  She has full range of motion and no obvious ligamentous laxity.  She has a very superficial wound that I do not feel would benefit from repair.  Will update her tetanus here.  Placed in a static splint.  PCP follow-up.  8:09 PM:  I have discussed the diagnosis/risks/treatment options with the patient.  Evaluation and diagnostic testing in the emergency department does not suggest an emergent condition requiring admission or immediate intervention beyond what has been performed at this time.  They will follow up with PCP. We also discussed returning to the ED immediately if new or worsening sx occur. We discussed the sx which are most concerning (e.g., sudden worsening pain, fever, inability to tolerate by mouth) that necessitate immediate return. Medications administered to the patient during their visit and any new prescriptions provided to the patient are listed below.  Medications given during this visit Medications  Tdap (BOOSTRIX) injection 0.5 mL (has no administration in time range)     The patient appears reasonably screen and/or stabilized for discharge and I doubt any other medical condition or other [facility] requiring further screening, evaluation, or treatment in the ED at this time prior to discharge.           Final Clinical Impression(s) / ED Diagnoses Final diagnoses:  Crush injury of hand, right, initial encounter    Rx / DC Orders ED Discharge Orders     None         Melene Plan, DO 07/27/22 2009

## 2022-07-27 NOTE — Discharge Instructions (Signed)
Rinse this area out really well when you get home.  Apply an ointment a couple times a day.  Please return for redness drainage or if you develop a fever.

## 2022-07-27 NOTE — ED Triage Notes (Signed)
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 Patient has laceration to right 2nd finger.

## 2023-01-26 ENCOUNTER — Emergency Department (HOSPITAL_BASED_OUTPATIENT_CLINIC_OR_DEPARTMENT_OTHER)
Admission: EM | Admit: 2023-01-26 | Discharge: 2023-01-27 | Disposition: A | Discharge status: Home / Self Care | Payer: Medicaid Other | Attending: Emergency Medicine | Admitting: Emergency Medicine

## 2023-01-26 ENCOUNTER — Emergency Department (HOSPITAL_BASED_OUTPATIENT_CLINIC_OR_DEPARTMENT_OTHER): Payer: Medicaid Other

## 2023-01-26 ENCOUNTER — Other Ambulatory Visit: Payer: Self-pay

## 2023-01-26 ENCOUNTER — Encounter (HOSPITAL_BASED_OUTPATIENT_CLINIC_OR_DEPARTMENT_OTHER): Payer: Self-pay

## 2023-01-26 DIAGNOSIS — M79644 Pain in right finger(s): Secondary | ICD-10-CM | POA: Insufficient documentation

## 2023-01-26 DIAGNOSIS — X58XXXA Exposure to other specified factors, initial encounter: Secondary | ICD-10-CM | POA: Diagnosis not present

## 2023-01-26 MED ORDER — ACETAMINOPHEN 325 MG PO TABS
650.0000 mg | ORAL_TABLET | Freq: Once | ORAL | Status: AC
  Administered 2023-01-26: 650 mg via ORAL
  Filled 2023-01-26: qty 2

## 2023-01-26 MED ORDER — NAPROXEN 250 MG PO TABS
500.0000 mg | ORAL_TABLET | Freq: Once | ORAL | Status: AC
  Administered 2023-01-26: 500 mg via ORAL
  Filled 2023-01-26: qty 2

## 2023-01-26 NOTE — ED Provider Notes (Signed)
[facility] Provider Note: [name], MD, FACEP  CSN: [id number] MRN: [id number] ARRIVAL: 01/26/23 at 2202 ROOM: [facility]07/[facility]07   CHIEF COMPLAINT  Finger Injury   HISTORY OF PRESENT ILLNESS  01/26/23 11:28 PM Susan Mcpherson is a 47 y.o. female who thinks she injured her right fifth finger at work yesterday evening.  She does not recall a specific injury or event but lifts boxes frequently and could have had a repetitive use injury.  She is having pain in her right fifth finger radiating up into the right fifth metacarpal area.  She rates her pain as an 8 out of 10 and her range of motion is reduced.   Past Medical History:  Diagnosis Date   Ectopic pregnancy    Migraine    Seizures (HCC)    not on any medications per MD, last seizure was sometime in 2017    Past Surgical History:  Procedure Laterality Date   CYSTOSCOPY W/ URETERAL STENT PLACEMENT Right 12/27/2021   Procedure: CYSTOSCOPY WITH RETROGRADE PYELOGRAM/URETERAL STENT PLACEMENT;  Surgeon: Crist Fat, MD;  Location: [facility] ORS;  Service: Urology;  Laterality: Right;   Gyn surgery     HERNIA REPAIR     KIDNEY STONE SURGERY Right    Lumps in rt breast     RHINOPLASTY     SHOULDER ARTHROSCOPY WITH ROTATOR CUFF REPAIR Right 04/22/2018   Procedure: Right shoulder arthroscopy with subacromial decompression, distal clavicle resection, rotator cuff repair;  Surgeon: Francena Hanly, MD;  Location: [facility] OR;  Service: Orthopedics;  Laterality: Right;     Family History  Problem Relation Age of Onset   Hypertension Father     Social History   Tobacco Use   Smoking status: Never   Smokeless tobacco: Never  Vaping Use   Vaping Use: Never used  Substance Use Topics   Alcohol use: No   Drug use: No    Prior to Admission medications   Medication Sig Start Date End Date Taking? Authorizing Provider  acetaminophen (TYLENOL) 325 MG tablet Take 2 tablets (650 mg total) by mouth every 6 (six) hours as  needed. 12/20/21   Sloan Leiter, DO  COPPER PO 1 Device by Intrauterine route once.  12/26/16   [provider]  guaiFENesin-dextromethorphan (ROBITUSSIN DM) 100-10 MG/5ML syrup Take 10 mLs by mouth every 6 (six) hours as needed for cough (chest congestion). 12/30/21   Glade Lloyd, MD  ibuprofen (ADVIL) 600 MG tablet Take 1 tablet (600 mg total) by mouth every 6 (six) hours as needed. 12/20/21   Sloan Leiter, DO  lamoTRIgine (LAMICTAL) 100 MG tablet Take 100 mg by mouth 2 (two) times daily. 12/10/21   [provider]  levETIRAcetam (KEPPRA) 750 MG tablet Take 750 mg by mouth in the morning and at bedtime. 02/15/21   [provider]  ondansetron (ZOFRAN) 4 MG tablet Take 1 tablet (4 mg total) by mouth every 8 (eight) hours as needed for nausea or vomiting. 12/24/21   Darrick Grinder, PA-C  oxybutynin (DITROPAN) 5 MG tablet Take 1 tablet (5 mg total) by mouth 3 (three) times daily. 12/30/21   Glade Lloyd, MD    Allergies Sumatriptan, Cinnamon, Codeine, and Latex   REVIEW OF SYSTEMS  Negative except as noted here or in the History of Present Illness.   PHYSICAL EXAMINATION  Initial Vital Signs Blood pressure 123/83, pulse 66, temperature 98.6 F (37 C), temperature source Oral, resp. rate 18, height 5\' 1"  (1.549  m), weight 72.6 kg, last menstrual period 01/11/2023, SpO2 97 %.  Examination General: Well-developed, well-nourished female in no acute distress; appearance consistent with age of record HENT: normocephalic; atraumatic Eyes: Normal appearance Neck: supple Heart: regular rate and rhythm Lungs: clear to auscultation bilaterally Abdomen: soft; nondistended; nontender; bowel sounds present Extremities: No deformity; tenderness and decreased range of motion of right fifth finger Neurologic: Awake, alert and oriented; motor function intact in all extremities and symmetric; no facial droop Skin: Warm and dry Psychiatric: Normal mood and affect   RESULTS   Summary of this visit's results, reviewed and interpreted by myself:   EKG Interpretation  Date/Time:    Ventricular Rate:    PR Interval:    QRS Duration:   QT Interval:    QTC Calculation:   R Axis:     Text Interpretation:         Laboratory Studies: No results found for this or any previous visit (from the past 24 hour(s)). Imaging Studies: DG Finger Little Right  Result Date: 01/26/2023 CLINICAL DATA:  Injury EXAM: RIGHT LITTLE FINGER 2+V COMPARISON:  None Available. FINDINGS: There is no evidence of fracture or dislocation. There is no evidence of arthropathy or other focal bone abnormality. Soft tissues are unremarkable. IMPRESSION: Negative. Electronically Signed   By: Darliss Cheney M.D.   On: 01/26/2023 23:00    ED COURSE and MDM  Nursing notes, initial and subsequent vitals signs, including pulse oximetry, reviewed and interpreted by myself.  Vitals:   01/26/23 2220 01/26/23 2224  BP:  123/83  Pulse:  66  Resp:  18  Temp:  98.6 F (37 C)  TempSrc:  Oral  SpO2:  97%  Weight: 72.6 kg   Height: 5\' 1"  (1.549 m)    Medications  naproxen (NAPROSYN) tablet 500 mg (has no administration in time range)  acetaminophen (TYLENOL) tablet 650 mg (650 mg Oral Given 01/26/23 2230)   No radiographic evidence of acute bony injury of right fifth finger.  We will place the finger in a splint for immobilization.  I suspect she has a sprain or tendinitis.  Will refer to hand surgery if symptoms persist.   PROCEDURES  Procedures   ED DIAGNOSES     ICD-10-CM   1. Finger pain, right  M79.644          Paula Libra, MD 01/26/23 2344

## 2023-01-26 NOTE — ED Triage Notes (Signed)
Pt believes she injured her right 5th digit at work last night but is in more pain today.

## 2023-01-26 NOTE — ED Notes (Signed)
Ice given in triage

## 2023-09-23 ENCOUNTER — Encounter (HOSPITAL_BASED_OUTPATIENT_CLINIC_OR_DEPARTMENT_OTHER): Payer: Self-pay

## 2023-09-23 ENCOUNTER — Other Ambulatory Visit: Payer: Self-pay

## 2023-09-23 DIAGNOSIS — N83202 Unspecified ovarian cyst, left side: Secondary | ICD-10-CM | POA: Insufficient documentation

## 2023-09-23 DIAGNOSIS — Z91040 Latex allergy status: Secondary | ICD-10-CM | POA: Diagnosis not present

## 2023-09-23 DIAGNOSIS — R1032 Left lower quadrant pain: Secondary | ICD-10-CM | POA: Diagnosis present

## 2023-09-23 LAB — URINALYSIS, MICROSCOPIC (REFLEX): Bacteria, UA: NONE SEEN

## 2023-09-23 LAB — COMPREHENSIVE METABOLIC PANEL
ALT: 22 U/L (ref 0–44)
AST: 16 U/L (ref 15–41)
Albumin: 4.2 g/dL (ref 3.5–5.0)
Alkaline Phosphatase: 61 U/L (ref 38–126)
Anion gap: 9 (ref 5–15)
BUN: 20 mg/dL (ref 6–20)
CO2: 23 mmol/L (ref 22–32)
Calcium: 9 mg/dL (ref 8.9–10.3)
Chloride: 103 mmol/L (ref 98–111)
Creatinine, Ser: 0.91 mg/dL (ref 0.44–1.00)
GFR, Estimated: >60 mL/min (ref >60)
Glucose, Bld: 115 mg/dL — ABNORMAL HIGH (ref 70–99)
Potassium: 4.1 mmol/L (ref 3.5–5.1)
Sodium: 135 mmol/L (ref 135–145)
Total Bilirubin: <0.2 mg/dL (ref 0.0–1.2)
Total Protein: 7.3 g/dL (ref 6.5–8.1)

## 2023-09-23 LAB — URINALYSIS, ROUTINE W REFLEX MICROSCOPIC
Bilirubin Urine: NEGATIVE
Glucose, UA: NEGATIVE mg/dL
Hgb urine dipstick: NEGATIVE
Ketones, ur: NEGATIVE mg/dL
Nitrite: NEGATIVE
Protein, ur: NEGATIVE mg/dL
Specific Gravity, Urine: 1.02 (ref 1.005–1.030)
pH: 8.5 — ABNORMAL HIGH (ref 5.0–8.0)

## 2023-09-23 LAB — CBC WITH DIFFERENTIAL/PLATELET
Abs Immature Granulocytes: 0.02 10*3/uL (ref 0.00–0.07)
Basophils Absolute: 0.1 10*3/uL (ref 0.0–0.1)
Basophils Relative: 1 %
Eosinophils Absolute: 0.1 10*3/uL (ref 0.0–0.5)
Eosinophils Relative: 2 %
HCT: 35.6 % — ABNORMAL LOW (ref 36.0–46.0)
Hemoglobin: 12 g/dL (ref 12.0–15.0)
Immature Granulocytes: 0 %
Lymphocytes Relative: 36 %
Lymphs Abs: 2.5 10*3/uL (ref 0.7–4.0)
MCH: 30.9 pg (ref 26.0–34.0)
MCHC: 33.7 g/dL (ref 30.0–36.0)
MCV: 91.8 fL (ref 80.0–100.0)
Monocytes Absolute: 0.4 10*3/uL (ref 0.1–1.0)
Monocytes Relative: 6 %
Neutro Abs: 3.8 10*3/uL (ref 1.7–7.7)
Neutrophils Relative %: 55 %
Platelets: 285 10*3/uL (ref 150–400)
RBC: 3.88 MIL/uL (ref 3.87–5.11)
RDW: 14.3 % (ref 11.5–15.5)
WBC: 7 10*3/uL (ref 4.0–10.5)
nRBC: 0 % (ref 0.0–0.2)

## 2023-09-23 LAB — HCG, SERUM, QUALITATIVE: Preg, Serum: NEGATIVE

## 2023-09-23 LAB — LIPASE, BLOOD: Lipase: 36 U/L (ref 11–51)

## 2023-09-23 MED ORDER — HYDROCODONE-ACETAMINOPHEN 5-325 MG PO TABS
1.0000 | ORAL_TABLET | Freq: Once | ORAL | Status: AC
  Administered 2023-09-23: 1 via ORAL
  Filled 2023-09-23: qty 1

## 2023-09-23 NOTE — ED Triage Notes (Signed)
Pt saw OB-GYN last week had US done and she has a cyst.  Pt states the pain has become worse and is constant Aleve helping "a little"

## 2023-09-24 ENCOUNTER — Emergency Department (HOSPITAL_BASED_OUTPATIENT_CLINIC_OR_DEPARTMENT_OTHER): Payer: Commercial Managed Care - PPO

## 2023-09-24 ENCOUNTER — Emergency Department (HOSPITAL_BASED_OUTPATIENT_CLINIC_OR_DEPARTMENT_OTHER)
Admission: EM | Admit: 2023-09-24 | Discharge: 2023-09-24 | Disposition: A | Discharge status: Home / Self Care | Payer: Commercial Managed Care - PPO | Attending: Emergency Medicine | Admitting: Emergency Medicine

## 2023-09-24 ENCOUNTER — Encounter (HOSPITAL_BASED_OUTPATIENT_CLINIC_OR_DEPARTMENT_OTHER): Payer: Self-pay

## 2023-09-24 DIAGNOSIS — N83202 Unspecified ovarian cyst, left side: Secondary | ICD-10-CM | POA: Diagnosis not present

## 2023-09-24 MED ORDER — HYDROMORPHONE HCL 1 MG/ML IJ SOLN
1.0000 mg | Freq: Once | INTRAMUSCULAR | Status: AC
  Administered 2023-09-24: 1 mg via INTRAVENOUS
  Filled 2023-09-24: qty 1

## 2023-09-24 MED ORDER — IOHEXOL 300 MG/ML  SOLN
100.0000 mL | Freq: Once | INTRAMUSCULAR | Status: AC | PRN
  Administered 2023-09-24: 100 mL via INTRAVENOUS

## 2023-09-24 MED ORDER — ONDANSETRON HCL 4 MG/2ML IJ SOLN
4.0000 mg | Freq: Once | INTRAMUSCULAR | Status: AC
  Administered 2023-09-24: 4 mg via INTRAVENOUS
  Filled 2023-09-24: qty 2

## 2023-09-24 MED ORDER — KETOROLAC TROMETHAMINE 30 MG/ML IJ SOLN
15.0000 mg | Freq: Once | INTRAMUSCULAR | Status: AC
Start: 2023-09-24 — End: 2023-09-24
  Administered 2023-09-24: 15 mg via INTRAVENOUS
  Filled 2023-09-24: qty 1

## 2023-09-24 MED ORDER — KETAMINE HCL 10 MG/ML IJ SOLN
0.3000 mg/kg | Freq: Once | INTRAMUSCULAR | Status: AC
  Administered 2023-09-24: 23 mg via INTRAVENOUS
  Filled 2023-09-24: qty 1

## 2023-09-24 MED ORDER — NAPROXEN 500 MG PO TABS: 500.0000 mg | ORAL_TABLET | Freq: Two times a day (BID) | ORAL | 0 refills | Status: DC | PRN

## 2023-09-24 NOTE — ED Provider Notes (Signed)
[facility] Provider Note   CSN: [id number] Arrival date & time: 09/23/23  2039     History  Chief Complaint  Patient presents with   Abdominal Pain    Susan Mcpherson is a 48 y.o. female.  Patient with a history of seizures and migraines presenting with left-sided lower abdominal pain for the past 2 weeks.  She reports the pain is constant.  She saw her gynecologist about a week ago and was diagnosed with a left ovarian cyst.  She reports there was no evidence of torsion.  She has been taking naproxen without only partial relief.  The pain is constant nothing makes it better or worse.  The pain has not moved at all.  She was also diagnosed with bacterial vaginosis and completed a course of Flagyl.  She denies any bleeding or discharge currently.  Did have some spotting last week that has since resolved.  No nausea, vomiting, fever, constipation, diarrhea, pain with urination or blood in the urine.  Eating and drinking well.  No fever.  No chest pain or shortness of breath.  Denies vaginal discharge or bleeding currently.  Comes in tonight because the pain to her left lower quadrant has been more severe over the past 2 days.  Does have history of kidney stones as well.  The history is provided by the patient.  Abdominal Pain Associated symptoms: no chest pain, no cough, no dysuria, no fever, no hematuria, no nausea, no shortness of breath, no vaginal bleeding and no vomiting        Home Medications Prior to Admission medications   Medication Sig Start Date End Date Taking? Authorizing Provider  acetaminophen (TYLENOL) 325 MG tablet Take 2 tablets (650 mg total) by mouth every 6 (six) hours as needed. 12/20/21   Sloan Leiter, DO  COPPER PO 1 Device by Intrauterine route once.  12/26/16   [provider]  guaiFENesin-dextromethorphan (ROBITUSSIN DM) 100-10 MG/5ML syrup Take 10 mLs by mouth every 6 (six) hours as needed for cough (chest  congestion). 12/30/21   Glade Lloyd, MD  ibuprofen (ADVIL) 600 MG tablet Take 1 tablet (600 mg total) by mouth every 6 (six) hours as needed. 12/20/21   Sloan Leiter, DO  lamoTRIgine (LAMICTAL) 100 MG tablet Take 100 mg by mouth 2 (two) times daily. 12/10/21   [provider]  levETIRAcetam (KEPPRA) 750 MG tablet Take 750 mg by mouth in the morning and at bedtime. 02/15/21   [provider]  ondansetron (ZOFRAN) 4 MG tablet Take 1 tablet (4 mg total) by mouth every 8 (eight) hours as needed for nausea or vomiting. 12/24/21   Darrick Grinder, PA-C  oxybutynin (DITROPAN) 5 MG tablet Take 1 tablet (5 mg total) by mouth 3 (three) times daily. 12/30/21   Glade Lloyd, MD      Allergies    Sumatriptan, Cinnamon, Codeine, and Latex    Review of Systems   Review of Systems  Constitutional:  Negative for activity change, appetite change and fever.  HENT:  Negative for congestion and rhinorrhea.   Respiratory:  Negative for cough, chest tightness and shortness of breath.   Cardiovascular:  Negative for chest pain.  Gastrointestinal:  Positive for abdominal pain. Negative for nausea and vomiting.  Genitourinary:  Negative for dysuria, hematuria and vaginal bleeding.  Musculoskeletal:  Positive for back pain.  Neurological:  Negative for dizziness, weakness and headaches.   all other systems are negative except  as noted in the HPI and PMH.    Physical Exam Updated Vital Signs BP (!) 152/91   Pulse 69   Temp 98.3 F (36.8 C) (Oral)   Resp 18   Ht 5\' 1"  (1.549 m)   Wt 76.7 kg   LMP 09/03/2023 (Exact Date)   SpO2 100%   BMI 31.93 kg/m  Physical Exam Vitals and nursing note reviewed.  Constitutional:      General: She is not in acute distress.    Appearance: She is well-developed.  HENT:     Head: Normocephalic and atraumatic.     Mouth/Throat:     Pharynx: No oropharyngeal exudate.  Eyes:     Conjunctiva/sclera: Conjunctivae normal.     Pupils: Pupils are equal,  round, and reactive to light.  Neck:     Comments: No meningismus. Cardiovascular:     Rate and Rhythm: Normal rate and regular rhythm.     Heart sounds: Normal heart sounds. No murmur heard. Pulmonary:     Effort: Pulmonary effort is normal. No respiratory distress.     Breath sounds: Normal breath sounds.  Abdominal:     Palpations: Abdomen is soft.     Tenderness: There is abdominal tenderness. There is no guarding or rebound.     Comments: Periumbilical left lower quadrant without guarding or rebound.  Genitourinary:    Comments: Declines pelvic exam Musculoskeletal:        General: Tenderness present. Normal range of motion.     Cervical back: Normal range of motion and neck supple.     Comments: Left CVA tenderness  Skin:    General: Skin is warm.  Neurological:     Mental Status: She is alert and oriented to person, place, and time.     Cranial Nerves: No cranial nerve deficit.     Motor: No abnormal muscle tone.     Coordination: Coordination normal.     Comments:  5/5 strength throughout. CN 2-12 intact.Equal grip strength.   Psychiatric:        Behavior: Behavior normal.     ED Results / Procedures / Treatments   Labs (all labs ordered are listed, but only abnormal results are displayed) Labs Reviewed  COMPREHENSIVE METABOLIC PANEL - Abnormal; Notable for the following components:      Result Value   Glucose, Bld 115 (*)    All other components within normal limits  CBC WITH DIFFERENTIAL/PLATELET - Abnormal; Notable for the following components:   HCT 35.6 (*)    All other components within normal limits  URINALYSIS, ROUTINE W REFLEX MICROSCOPIC - Abnormal; Notable for the following components:   APPearance HAZY (*)    pH 8.5 (*)    Leukocytes,Ua TRACE (*)    All other components within normal limits  HCG, SERUM, QUALITATIVE  LIPASE, BLOOD  URINALYSIS, MICROSCOPIC (REFLEX)    EKG None  Radiology CT ABDOMEN PELVIS W CONTRAST Result Date:  09/24/2023 CLINICAL DATA:  Left lower quadrant pain increased from 1 week ago EXAM: CT ABDOMEN AND PELVIS WITH CONTRAST TECHNIQUE: Multidetector CT imaging of the abdomen and pelvis was performed using the standard protocol following bolus administration of intravenous contrast. RADIATION DOSE REDUCTION: This exam was performed according to the departmental dose-optimization program which includes automated exposure control, adjustment of the mA and/or kV according to patient size and/or use of iterative reconstruction technique. CONTRAST:  OMNIPAQUE IOHEXOL 300 MG/ML  SOLN COMPARISON:  Ultrasound from earlier in the same day as well as  from 09/17/2023 FINDINGS: Lower chest: No acute abnormality. Hepatobiliary: No focal liver abnormality is seen. No gallstones, gallbladder wall thickening, or biliary dilatation. Pancreas: Unremarkable. No pancreatic ductal dilatation or surrounding inflammatory changes. Spleen: Normal in size without focal abnormality. Adrenals/Urinary Tract: Adrenal glands are within normal limits. Kidneys demonstrate a normal enhancement pattern bilaterally. No renal calculi or obstructive changes are seen. The bladder is decompressed. Stomach/Bowel: No obstructive or inflammatory changes of the colon are seen. Mild retained fecal material is noted without obstructive change. The appendix is within normal limits. Small bowel and stomach are unremarkable. Vascular/Lymphatic: No significant vascular findings are present. No enlarged abdominal or pelvic lymph nodes. Reproductive: Uterus is within normal limits. No right adnexal mass is seen. Left ovary demonstrates an involuting cyst consistent with the patient's recent symptomatology. IUD is noted in place. Other: No abdominal wall hernia or abnormality. No abdominopelvic ascites. Musculoskeletal: No acute or significant osseous findings. IMPRESSION: Involuting left ovarian cyst. This would correspond with the patient's given clinical history  of increased pain. No other focal abnormality is noted. Electronically Signed   By: Alcide Clever M.D.   On: 09/24/2023 02:35   US PELVIC COMPLETE W TRANSVAGINAL AND TORSION R/O Result Date: 09/24/2023 CLINICAL DATA:  Left lower quadrant pain EXAM: TRANSABDOMINAL AND TRANSVAGINAL ULTRASOUND OF PELVIS DOPPLER ULTRASOUND OF OVARIES TECHNIQUE: Both transabdominal and transvaginal ultrasound examinations of the pelvis were performed. Transabdominal technique was performed for global imaging of the pelvis including uterus, ovaries, adnexal regions, and pelvic cul-de-sac. It was necessary to proceed with endovaginal exam following the transabdominal exam to visualize the ovaries. Color and duplex Doppler ultrasound was utilized to evaluate blood flow to the ovaries. COMPARISON:  09/17/2023 FINDINGS: Uterus Measurements: 9.8 x 5.2 x 6.7 cm. = volume: 180 mL. No fibroids or other mass visualized. Endometrium Thickness: 12.6 mm.  IUD is noted in place Right ovary Measurements: 3.0 x 1.6 x 1.9 cm. = volume: 4.7 mL. Normal appearance/no adnexal mass. Left ovary Measurements: 4.1 x 2.0 x 2.6 cm. = volume: 11.3 mL. Hypoechoic structure is noted within the left ovary now measuring 2.8 cm. The previously seen simple cyst is no longer identified. Changes may be related to involuting cyst. No free fluid is noted. Pulsed Doppler evaluation of both ovaries demonstrates normal low-resistance arterial and venous waveforms. Other findings No abnormal free fluid. IMPRESSION: IUD in place. Previously seen simple cyst in the left adnexa is not as well appreciated. A more complex cystic lesion is now seen which likely represents an involuting cyst. Electronically Signed   By: Alcide Clever M.D.   On: 09/24/2023 02:32    Procedures Procedures    Medications Ordered in ED Medications  HYDROmorphone (DILAUDID) injection 1 mg (has no administration in time range)  ondansetron (ZOFRAN) injection 4 mg (has no administration in time  range)  HYDROcodone-acetaminophen (NORCO/VICODIN) 5-325 MG per tablet 1 tablet (1 tablet Oral Given 09/23/23 2123)    ED Course/ Medical Decision Making/ A&P                                 Medical Decision Making Amount and/or Complexity of Data Reviewed Labs: ordered. Decision-making details documented in ED Course. Radiology: ordered and independent interpretation performed. Decision-making details documented in ED Course. ECG/medicine tests: ordered and independent interpretation performed. Decision-making details documented in ED Course.  Risk Prescription drug management.   Left lower quadrant pain for the past 2 weeks recently  diagnosed with ovarian cyst by her gynecologist.  Also completed course of Flagyl for bacterial vaginosis.  Stable vital signs.  Abdomen soft without peritoneal signs.  HCG is negative.  Urinalysis is negative.  Patient declines pelvic exam stating she had 1 last week.  She is concerned her pain is significantly worse since she saw the gynecologist.  Ultrasound obtained in triage is negative for ovarian torsion.  Does show involuting complex cyst to the left ovary which likely explains her pain.  Doppler flow was normal. IUD in appropriate position.  Still with intractable pain after ultrasound.  CT scan is obtained to rule out additional pathology such as kidney stone.  This shows no additional findings.  Pain improved after Toradol and ketamine in the ED.  Patient tolerating p.o.  She reports resolution of pain.  Discussed follow-up with her gynecologist for further evaluation of her ovarian cyst.  Workup today does not show any evidence of ovarian torsion but does show involuting cyst on the left side.  No other acute abnormalities identified.  Return to the  ED with new or worsening symptoms.        Final Clinical Impression(s) / ED Diagnoses Final diagnoses:  Cyst of left ovary    Rx / DC Orders ED Discharge Orders     None          Bannon Giammarco, Jeannett Senior, MD 09/24/23 [phone]

## 2023-09-24 NOTE — ED Notes (Signed)
Patient transported to Ultrasound

## 2023-09-24 NOTE — Discharge Instructions (Addendum)
Your testing shows no evidence of compromised blood flow to the ovary.  Does show a complex ovarian cyst.  Follow-up with your gynecologist for further evaluation of this and take the pain medication as prescribed.  Return to the ED with new or worsening symptoms.

## 2023-09-24 NOTE — ED Notes (Signed)
Patient transported to CT

## 2023-09-27 ENCOUNTER — Emergency Department (HOSPITAL_BASED_OUTPATIENT_CLINIC_OR_DEPARTMENT_OTHER)
Admission: EM | Admit: 2023-09-27 | Discharge: 2023-09-27 | Disposition: A | Discharge status: Home / Self Care | Payer: Commercial Managed Care - PPO | Attending: Emergency Medicine | Admitting: Emergency Medicine

## 2023-09-27 ENCOUNTER — Emergency Department (HOSPITAL_BASED_OUTPATIENT_CLINIC_OR_DEPARTMENT_OTHER): Payer: Commercial Managed Care - PPO

## 2023-09-27 ENCOUNTER — Other Ambulatory Visit: Payer: Self-pay

## 2023-09-27 ENCOUNTER — Encounter (HOSPITAL_BASED_OUTPATIENT_CLINIC_OR_DEPARTMENT_OTHER): Payer: Self-pay | Admitting: Emergency Medicine

## 2023-09-27 DIAGNOSIS — M25532 Pain in left wrist: Secondary | ICD-10-CM | POA: Insufficient documentation

## 2023-09-27 DIAGNOSIS — M25571 Pain in right ankle and joints of right foot: Secondary | ICD-10-CM | POA: Diagnosis present

## 2023-09-27 DIAGNOSIS — W01198A Fall on same level from slipping, tripping and stumbling with subsequent striking against other object, initial encounter: Secondary | ICD-10-CM | POA: Insufficient documentation

## 2023-09-27 DIAGNOSIS — Y92014 Private driveway to single-family (private) house as the place of occurrence of the external cause: Secondary | ICD-10-CM | POA: Diagnosis not present

## 2023-09-27 DIAGNOSIS — W19XXXA Unspecified fall, initial encounter: Secondary | ICD-10-CM

## 2023-09-27 MED ORDER — NAPROXEN 375 MG PO TABS: 375.0000 mg | ORAL_TABLET | Freq: Two times a day (BID) | ORAL | 0 refills | Status: DC

## 2023-09-27 MED ORDER — NAPROXEN 250 MG PO TABS
500.0000 mg | ORAL_TABLET | Freq: Once | ORAL | Status: AC
  Administered 2023-09-27: 500 mg via ORAL
  Filled 2023-09-27: qty 2

## 2023-09-27 NOTE — ED Triage Notes (Signed)
Pt reports falling outside at home yesterday; c/o RT ankle/foot and LT hand pain

## 2023-09-27 NOTE — Discharge Instructions (Signed)
Thank you for letting us evaluate you today.  Your x-ray of your left hand, right ankle, and right foot are negative for fracture.  However this does not rule out a sprain.  We provided you with naproxen here in the emergency department for pain.  Please continue to rest, elevate, ice areas to reduce swelling and attempt to improve pain.  I have also sent naproxen to your pharmacy to use.  Do not take this with ibuprofen, Aleve, aspirin.  Please follow-up with your orthopedic Dr. Theophilus Bones for further evaluation and management.  Return to emergency department if you experience pale cool extremities, significant worsening of pain

## 2023-09-27 NOTE — ED Provider Notes (Signed)
[place] [facility] AT [facility] Provider Note   CSN: [id number] Arrival date & time: 09/27/23  1711     History  Chief Complaint  Patient presents with   Susan Mcpherson is a 48 y.o. female with past medical history of migraine, anemia presents to the emergency department for evaluation of right ankle, right foot, left wrist pain following a fall at home yesterday.  She reports that she was walking onto her driveway when she accidentally tripped on the edge of driveway and everting right ankle causing her to fall onto both of her hands on driveway.  She denies complaints prior to the fall, head injury, LOC, altered mentation, visual disturbances, thinners.  Of note she recently had right ankle sprain with small fibular distal avulsion and possible fracture of the calcaneus in July 2024 and was seen by Dr. Theophilus Bones at [facility] Pertinent negatives include no chest pain, no abdominal pain, no headaches and no shortness of breath.       Home Medications Prior to Admission medications   Medication Sig Start Date End Date Taking? Authorizing Provider  naproxen (NAPROSYN) 375 MG tablet Take 1 tablet (375 mg total) by mouth 2 (two) times daily. 09/27/23  Yes Judithann Sheen, PA  acetaminophen (TYLENOL) 325 MG tablet Take 2 tablets (650 mg total) by mouth every 6 (six) hours as needed. 12/20/21   Sloan Leiter, DO  COPPER PO 1 Device by Intrauterine route once.  12/26/16   [provider]  guaiFENesin-dextromethorphan (ROBITUSSIN DM) 100-10 MG/5ML syrup Take 10 mLs by mouth every 6 (six) hours as needed for cough (chest congestion). 12/30/21   Glade Lloyd, MD  ibuprofen (ADVIL) 600 MG tablet Take 1 tablet (600 mg total) by mouth every 6 (six) hours as needed. 12/20/21   Sloan Leiter, DO  lamoTRIgine (LAMICTAL) 100 MG tablet Take 100 mg by mouth 2 (two) times daily. 12/10/21   [provider]  levETIRAcetam (KEPPRA) 750 MG tablet Take  750 mg by mouth in the morning and at bedtime. 02/15/21   [provider]  naproxen (NAPROSYN) 500 MG tablet Take 1 tablet (500 mg total) by mouth 2 (two) times daily as needed. 09/24/23   Rancour, Jeannett Senior, MD  ondansetron (ZOFRAN) 4 MG tablet Take 1 tablet (4 mg total) by mouth every 8 (eight) hours as needed for nausea or vomiting. 12/24/21   Darrick Grinder, PA-C  oxybutynin (DITROPAN) 5 MG tablet Take 1 tablet (5 mg total) by mouth 3 (three) times daily. 12/30/21   Glade Lloyd, MD      Allergies    Sumatriptan, Cinnamon, Codeine, and Latex    Review of Systems   Review of Systems  Constitutional:  Negative for chills, fatigue and fever.  Respiratory:  Negative for cough, chest tightness, shortness of breath and wheezing.   Cardiovascular:  Negative for chest pain and palpitations.  Gastrointestinal:  Negative for abdominal pain, constipation, diarrhea, nausea and vomiting.  Musculoskeletal:  Positive for arthralgias.  Neurological:  Negative for dizziness, seizures, weakness, light-headedness, numbness and headaches.    Physical Exam Updated Vital Signs BP 122/81 (BP Location: Right Arm)   Pulse 80   Temp 97.9 F (36.6 C)   Resp 18   Ht 5\' 1"  (1.549 m)   Wt 76.7 kg   LMP 09/03/2023 (Exact Date)   SpO2 100%   BMI 31.95 kg/m  Physical Exam Vitals and nursing note reviewed.  Constitutional:  General: She is not in acute distress.    Appearance: Normal appearance. She is not diaphoretic.  HENT:     Head: Normocephalic and atraumatic.     Comments: No hematoma nor TTP of cranium No crepitus to facial bones    Right Ear: External ear normal. No hemotympanum.     Left Ear: External ear normal. No hemotympanum.     Nose: Nose normal.     Right Nostril: No epistaxis or septal hematoma.     Left Nostril: No epistaxis or septal hematoma.     Mouth/Throat:     Mouth: Mucous membranes are moist. No injury or lacerations.  Eyes:     General:        Right eye: No  discharge.        Left eye: No discharge.     Extraocular Movements: Extraocular movements intact.     Conjunctiva/sclera: Conjunctivae normal.     Pupils: Pupils are equal, round, and reactive to light.     Comments: No subconjunctival hemorrhage, hyphema, tear drop pupil, or fluid leakage bilaterally  Neck:     Vascular: No carotid bruit.  Cardiovascular:     Rate and Rhythm: Normal rate.     Pulses: Normal pulses.          Radial pulses are 2+ on the right side and 2+ on the left side.       Dorsalis pedis pulses are 2+ on the right side and 2+ on the left side.  Pulmonary:     Effort: Pulmonary effort is normal. No respiratory distress.     Breath sounds: Normal breath sounds. No wheezing.  Chest:     Chest wall: No tenderness.  Abdominal:     General: Bowel sounds are normal. There is no distension.     Palpations: Abdomen is soft.     Tenderness: There is no abdominal tenderness. There is no guarding or rebound.  Musculoskeletal:     Cervical back: Full passive range of motion without pain, normal range of motion and neck supple. No deformity, rigidity or bony tenderness. Normal range of motion.     Thoracic back: No deformity or bony tenderness. Normal range of motion.     Lumbar back: No deformity or bony tenderness. Normal range of motion.     Right hip: No bony tenderness or crepitus.     Left hip: No bony tenderness or crepitus.     Right lower leg: No edema.     Left lower leg: No edema.     Comments: 4/5 dorsi and plantarflexion of right ankle secondary to pain.  No gross swelling, overlying skin changes, ecchymosis, redness, break in skin integrity to foot or ankle. Pain with weight bearing.  Sensation 2/2 of L2-S2 BLE. 5/5 of left wrist flexion and extension however notes pain with flexion extension of wrist.  No gross swelling, ecchymosis, redness, break in skin integrity to left wrist. TTP of left wrist diffusely.  TTP of right ankle diffusely and dorsal aspect of  right foot. No obvious deformity to joints or long bones Pelvis stable with no shortening or rotation of LE bilaterally  Skin:    General: Skin is warm and dry.     Capillary Refill: Capillary refill takes less than 2 seconds.     Comments: Old well-healed scar on palmar aspect of left lower hand from previous surgery.  Otherwise no overlying skin changes, warmth, erythema, swelling.  Neurological:     General: No focal  deficit present.     Mental Status: She is alert and oriented to person, place, and time. Mental status is at baseline.     GCS: GCS eye subscore is 4. GCS verbal subscore is 5. GCS motor subscore is 6.     Cranial Nerves: Cranial nerves 2-12 are intact. No cranial nerve deficit.     Sensory: Sensation is intact. No sensory deficit.     Motor: Motor function is intact. No weakness or tremor.     Coordination: Coordination is intact. Coordination normal. Finger-Nose-Finger Test and Heel to [facility] Test normal.     Gait: Gait abnormal (mild limp with left sided preference 2/2 pain of right ankle).     Deep Tendon Reflexes: Reflexes are normal and symmetric. Reflexes normal.     Comments: Acting following commands appropriately     ED Results / Procedures / Treatments   Labs (all labs ordered are listed, but only abnormal results are displayed) Labs Reviewed - No data to display  EKG None  Radiology DG Hand Complete Left Result Date: 09/27/2023 CLINICAL DATA:  Pain after a fall yesterday EXAM: LEFT HAND - COMPLETE 3+ VIEW COMPARISON:  05/18/2023 FINDINGS: Mild degenerative changes in the interphalangeal joints and first carpometacarpal joint. No evidence of acute fracture or dislocation. No focal bone lesion or bone destruction. Soft tissues are unremarkable. IMPRESSION: No acute bony abnormalities.  Mild degenerative changes. Electronically Signed   By: Burman Nieves M.D.   On: 09/27/2023 18:24   DG Ankle Complete Right Result Date: 09/27/2023 CLINICAL DATA:  Pain after  a fall yesterday. EXAM: RIGHT ANKLE - COMPLETE 3+ VIEW COMPARISON:  Right foot 11/28/2022 FINDINGS: There is no evidence of fracture, dislocation, or joint effusion. There is no evidence of arthropathy or other focal bone abnormality. Soft tissues are unremarkable. IMPRESSION: Negative. Electronically Signed   By: Burman Nieves M.D.   On: 09/27/2023 18:23   DG Foot Complete Right Result Date: 09/27/2023 CLINICAL DATA:  Pain after a fall yesterday. EXAM: RIGHT FOOT COMPLETE - 3+ VIEW COMPARISON:  11/28/2022 FINDINGS: There is no evidence of fracture or dislocation. There is no evidence of arthropathy or other focal bone abnormality. Soft tissues are unremarkable. IMPRESSION: Negative. Electronically Signed   By: Burman Nieves M.D.   On: 09/27/2023 18:22    Procedures Procedures    Medications Ordered in ED Medications  naproxen (NAPROSYN) tablet 500 mg (500 mg Oral Given 09/27/23 2008)    ED Course/ Medical Decision Making/ A&P                                 Medical Decision Making Amount and/or Complexity of Data Reviewed Radiology: ordered.   Patient presents to the ED for concern of pain following fall, this involves an extensive number of treatment options, and is a complaint that carries with it a high risk of complications and morbidity.  The differential diagnosis includes fracture, dislocation, hemarthrosis, muscle sprain, strain   Co morbidities that complicate the patient evaluation  Recent ankle sprain and fracture   Additional history obtained:  Additional history obtained from Nursing and Outside Medical Records   External records from outside source obtained and reviewed including  Triage RN note Dr. Wynelle Cleveland note regarding most recent management of right ankle     Imaging Studies ordered:  I ordered imaging studies including left wrist x-ray, right ankle x-ray, right foot x-ray I independently visualized and interpreted  imaging which showed no  fracture, joint space abnormalities, nor dislocation I agree with the radiologist interpretation    Medicines ordered and prescription drug management:  I ordered medication including naprosyn  for pain Reevaluation of the patient after these medicines showed that the patient improved I have reviewed the patients home medicines and have made adjustments as needed    Problem List / ED Course:  Fall, initial encounter Left wrist pain Right ankle pain X-ray of left wrist, right ankle, right foot negative for fracture.  However, there is mildly reduced range of motion of right ankle likely secondary to pain and possible sprain We will provide patient with cam boot and naproxen Patient was recently followed up with Dr. Theophilus Bones at [facility].  While patient follow back up with them for right ankle sprain Discussed x-ray findings, disposition, return to emergency department precautions with patient expresses understanding agrees with plan.  I provided patient with work note so there is not a reinjury prior to Ortho follow-up.  Questions answered to her satisfaction.  Reevaluation:  After the interventions noted above, I reevaluated the patient and found that they have :improved   Social Determinants of Health:  Has orthopedic f/u   Dispostion:  After consideration of the diagnostic results and the patients response to treatment, I feel that the patent would benefit from outpatient management and ortho f/u.    Final Clinical Impression(s) / ED Diagnoses Final diagnoses:  Fall, initial encounter  Acute right ankle pain  Left wrist pain    Rx / DC Orders ED Discharge Orders          Ordered    naproxen (NAPROSYN) 375 MG tablet  2 times daily        09/27/23 2013              Judithann Sheen, [place] 09/27/23 2016    Vanetta Mulders, MD 09/27/23 2356

## 2024-02-04 ENCOUNTER — Emergency Department (HOSPITAL_COMMUNITY)

## 2024-02-04 ENCOUNTER — Emergency Department (HOSPITAL_COMMUNITY)
Admission: EM | Admit: 2024-02-04 | Discharge: 2024-02-04 | Disposition: A | Discharge status: Home / Self Care | Attending: Emergency Medicine | Admitting: Emergency Medicine

## 2024-02-04 DIAGNOSIS — M79672 Pain in left foot: Secondary | ICD-10-CM | POA: Diagnosis not present

## 2024-02-04 DIAGNOSIS — S99912A Unspecified injury of left ankle, initial encounter: Secondary | ICD-10-CM | POA: Diagnosis present

## 2024-02-04 DIAGNOSIS — W108XXA Fall (on) (from) other stairs and steps, initial encounter: Secondary | ICD-10-CM | POA: Diagnosis not present

## 2024-02-04 DIAGNOSIS — Z91040 Latex allergy status: Secondary | ICD-10-CM | POA: Insufficient documentation

## 2024-02-04 DIAGNOSIS — S93402A Sprain of unspecified ligament of left ankle, initial encounter: Secondary | ICD-10-CM | POA: Diagnosis not present

## 2024-02-04 MED ORDER — NAPROXEN 500 MG PO TABS
500.0000 mg | ORAL_TABLET | Freq: Two times a day (BID) | ORAL | 0 refills | Status: AC
Start: 2024-02-04 — End: ?

## 2024-02-04 MED ORDER — KETOROLAC TROMETHAMINE 15 MG/ML IJ SOLN
15.0000 mg | Freq: Once | INTRAMUSCULAR | Status: AC
  Administered 2024-02-04: 15 mg via INTRAVENOUS
  Filled 2024-02-04: qty 1

## 2024-02-04 NOTE — ED Provider Notes (Signed)
 [place] EMERGENCY DEPARTMENT AT [facility] Provider Note   CSN: [id number] Arrival date & time: 02/04/24  1844     Patient presents with: Ankle Pain   Susan Mcpherson is a 48 y.o. female.    Ankle Pain  Patient is a 48 year old female presents ED today with complaints of left ankle pain, left foot pain post fall earlier today, noting that she missed the last step on her stair, inverting her ankle and falling onto her backside.  Has not ambulated since the incident due to pain in her foot/ankle.  Says that she has mild tingling to that foot.  Brought in by EMS and given fentanyl  en route.  Denies head injury, LOC, blood thinners, weakness, numbness, back pain, neck pain.     Prior to Admission medications   Medication Sig Start Date End Date Taking? Authorizing Provider  acetaminophen  (TYLENOL ) 325 MG tablet Take 2 tablets (650 mg total) by mouth every 6 (six) hours as needed. 12/20/21   Teddi Favors, DO  COPPER PO 1 Device by Intrauterine route once.  12/26/16   [provider]  guaiFENesin -dextromethorphan  (ROBITUSSIN DM) 100-10 MG/5ML syrup Take 10 mLs by mouth every 6 (six) hours as needed for cough (chest congestion). 12/30/21   Audria Leather, MD  ibuprofen  (ADVIL ) 600 MG tablet Take 1 tablet (600 mg total) by mouth every 6 (six) hours as needed. 12/20/21   Teddi Favors, DO  lamoTRIgine  (LAMICTAL ) 100 MG tablet Take 100 mg by mouth 2 (two) times daily. 12/10/21   [provider]  levETIRAcetam  (KEPPRA ) 750 MG tablet Take 750 mg by mouth in the morning and at bedtime. 02/15/21   [provider]  naproxen  (NAPROSYN ) 375 MG tablet Take 1 tablet (375 mg total) by mouth 2 (two) times daily. 09/27/23   Royann Cords, PA  naproxen  (NAPROSYN ) 500 MG tablet Take 1 tablet (500 mg total) by mouth 2 (two) times daily as needed. 09/24/23   Rancour, Mara [place]in[place], MD  ondansetron  (ZOFRAN ) 4 MG tablet Take 1 tablet (4 mg total) by mouth every 8 (eight) hours as  needed for nausea or vomiting. 12/24/21   Elisa Guest, PA-C  oxybutynin  (DITROPAN ) 5 MG tablet Take 1 tablet (5 mg total) by mouth 3 (three) times daily. 12/30/21   Audria Leather, MD    Allergies: Sumatriptan, Cinnamon, Codeine, and Latex    Review of Systems  Musculoskeletal:  Positive for arthralgias.  All other systems reviewed and are negative.   Updated Vital Signs BP 128/74   Pulse 68   Temp 98.2 F (36.8 C) (Oral)   Resp 16   SpO2 100%   Physical Exam Vitals and nursing note reviewed.  Constitutional:      General: She is not in acute distress.    Appearance: Normal appearance. She is not ill-appearing.  HENT:     Head: Normocephalic and atraumatic.   Eyes:     Extraocular Movements: Extraocular movements intact.     Conjunctiva/sclera: Conjunctivae normal.    Cardiovascular:     Rate and Rhythm: Normal rate and regular rhythm.     Pulses: Normal pulses.     Heart sounds: Normal heart sounds. No murmur heard.    No friction rub. No gallop.  Pulmonary:     Effort: Pulmonary effort is normal. No respiratory distress.     Breath sounds: Normal breath sounds.  Abdominal:     General: Abdomen is flat.     Palpations: Abdomen is  soft.     Tenderness: There is no abdominal tenderness.   Musculoskeletal:        General: Tenderness present. No swelling or deformity.     Cervical back: Normal range of motion and neck supple. No rigidity or tenderness.     Right lower leg: No edema.     Left lower leg: No edema.   Skin:    General: Skin is warm and dry.   Neurological:     General: No focal deficit present.     Mental Status: She is alert. Mental status is at baseline.   Psychiatric:        Mood and Affect: Mood normal.     (all labs ordered are listed, but only abnormal results are displayed) Labs Reviewed - No data to display  EKG: None  Radiology: DG Foot Complete Left Result Date: 02/04/2024 CLINICAL DATA:  Left foot pain following fall,  initial encounter EXAM: LEFT FOOT - COMPLETE 3+ VIEW COMPARISON:  None Available. FINDINGS: There is no evidence of fracture or dislocation. There is no evidence of arthropathy or other focal bone abnormality. Soft tissues are unremarkable. IMPRESSION: No acute abnormality noted. Electronically Signed   By: Violeta Grey M.D.   On: 02/04/2024 21:15   DG Ankle Complete Left Result Date: 02/04/2024 CLINICAL DATA:  Fall, left ankle pain EXAM: LEFT ANKLE COMPLETE - 3+ VIEW COMPARISON:  None Available. FINDINGS: There is no evidence of fracture, dislocation, or joint effusion. There is no evidence of arthropathy or other focal bone abnormality. Soft tissues are unremarkable. IMPRESSION: Negative. Electronically Signed   By: Janeece Mechanic M.D.   On: 02/04/2024 19:53     Procedures   Medications Ordered in the ED  ketorolac  (TORADOL ) 15 MG/ML injection 15 mg (15 mg Intravenous Given 02/04/24 1958)                               Medical Decision Making Amount and/or Complexity of Data Reviewed Radiology: ordered.  Risk Prescription drug management.   This patient is a 48 year old female who presents to the ED for concern of right foot pain right ankle pain post fall downstairs.  Provided fentanyl  and route.  On physical exam, patient is in no acute distress, afebrile, alert and orient x 4, speaking in full sentences, nontachypneic, nontachycardic.  Good cap refill, DP pulse 2+, range of motion intact at toes, noted to have tenderness over the lateral malleolus on the foot.  Sensation intact.  No bruising or swelling noted.  Unremarkable exam otherwise.  X-ray of left foot and left ankle were negative.  Provided boot and crutches.  Patient says that she has orthopedic surgeon who she would like to get in contact with and does not need any referral to 1.  Will have her follow-up with orthopedic surgery for any persistence and return to ED for new or worsening symptoms.  Patient vital signs have remained  stable throughout the course of patient's time in the ED. Low suspicion for any other emergent pathology at this time. I believe this patient is safe to be discharged. Provided strict return to ER precautions. Patient expressed agreement and understanding of plan. All questions were answered.  Differential diagnoses prior to evaluation: The emergent differential diagnosis includes, but is not limited to, fracture, ligamentous injury, neurovascular injury, dislocation, malalignment. This is not an exhaustive differential.   Past Medical History / Co-morbidities / Social History: Migraine, ectopic pregnancy,  seizures anemia  Additional history: Chart reviewed. Pertinent results include: Seen by urgent care on 01/11/2024 for migraines with nausea vomiting  Lab Tests/Imaging studies: I personally interpreted labs/imaging and the pertinent results include:    X-ray of left foot and ankle were negative.  Lubertha Rush agree with the radiologist interpretation.   Medications: I ordered medication including Toradol .  I have reviewed the patients home medicines and have made adjustments as needed.  Critical Interventions: none  Social Determinants of Health: none  Disposition: After consideration of the diagnostic results and the patients response to treatment, I feel that the patient would benefit from discharge and treatment as above.   emergency department workup does not suggest an emergent condition requiring admission or immediate intervention beyond what has been performed at this time. The plan is: Lanell Pinta was given and crutches provided, will have her follow-up with orthopedics for any persistent symptoms, return to ED for any new or worsening symptoms. The patient is safe for discharge and has been instructed to return immediately for worsening symptoms, change in symptoms or any other concerns.    Final diagnoses:  Sprain of left ankle, unspecified ligament, initial encounter    ED Discharge  Orders     None          Vevelyn Gowers 02/04/24 2208    Cheyenne Cotta, MD 02/06/24 1219

## 2024-02-04 NOTE — ED Triage Notes (Addendum)
 Fell down last 2 stairs and landed on L ankle- heard crunch. No deformity noted- painful to touch and splinted by EMS. 200 mcg fentanyl  given. PMS intact. Pulses marked.

## 2024-02-04 NOTE — Discharge Instructions (Addendum)
 You are seen today for left ankle sprain.  You were given a boot as well as crutches.  Recommend you to rest, ice the area, elevate, compress the area to help with swelling and pain.  Recommend he also use Tylenol  ibuprofen  as additional pain relief.  Take Tylenol  (acetominophen)  650mg  every 4-6 hours, as needed for pain or fever. Do not take more than 4,000 mg in a 24-hour period. As this may cause liver damage. While this is rare, if you begin to develop yellowing of the skin or eyes, stop taking and return to ER immediately.  Take Ibuprofen  400mg  every 4-6 hours for pain or fever, not exceeding 3,200 mg per day as more than 3,200mg  can cause Stomach irritation, dizziness, kidney issues with long-term use.  Please call the orthopedic doctor that you said you have access to to [facility] if you have any persistent symptoms.
